# Patient Record
Sex: Female | Born: 1955 | Race: Black or African American | Hispanic: No | Marital: Married | Smoking: Never smoker
Health system: Southern US, Community
[De-identification: ages and names within clinical notes are randomized; demographics above are authoritative.]

## PROBLEM LIST (undated history)

## (undated) DIAGNOSIS — Z780 Asymptomatic menopausal state: Secondary | ICD-10-CM

## (undated) DIAGNOSIS — R87610 Atypical squamous cells of undetermined significance on cytologic smear of cervix (ASC-US): Secondary | ICD-10-CM

## (undated) DIAGNOSIS — Z972 Presence of dental prosthetic device (complete) (partial): Secondary | ICD-10-CM

## (undated) DIAGNOSIS — R0600 Dyspnea, unspecified: Secondary | ICD-10-CM

## (undated) DIAGNOSIS — E7800 Pure hypercholesterolemia, unspecified: Secondary | ICD-10-CM

## (undated) DIAGNOSIS — D259 Leiomyoma of uterus, unspecified: Secondary | ICD-10-CM

## (undated) DIAGNOSIS — K439 Ventral hernia without obstruction or gangrene: Secondary | ICD-10-CM

## (undated) DIAGNOSIS — I10 Essential (primary) hypertension: Secondary | ICD-10-CM

## (undated) DIAGNOSIS — E559 Vitamin D deficiency, unspecified: Secondary | ICD-10-CM

## (undated) DIAGNOSIS — I493 Ventricular premature depolarization: Secondary | ICD-10-CM

## (undated) DIAGNOSIS — E890 Postprocedural hypothyroidism: Secondary | ICD-10-CM

## (undated) HISTORY — DX: Asymptomatic menopausal state: Z78.0

## (undated) HISTORY — DX: Vitamin D deficiency, unspecified: E55.9

## (undated) HISTORY — DX: Atypical squamous cells of undetermined significance on cytologic smear of cervix (ASC-US): R87.610

## (undated) HISTORY — PX: COSMETIC SURGERY: SHX468

## (undated) HISTORY — DX: Pure hypercholesterolemia, unspecified: E78.00

## (undated) HISTORY — DX: Leiomyoma of uterus, unspecified: D25.9

## (undated) HISTORY — DX: Essential (primary) hypertension: I10

## (undated) HISTORY — DX: Postprocedural hypothyroidism: E89.0

## (undated) HISTORY — DX: Ventral hernia without obstruction or gangrene: K43.9

---
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
## 2004-08-19 HISTORY — PX: BREAST BIOPSY: SHX20

## 2005-06-14 ENCOUNTER — Ambulatory Visit: Payer: Self-pay | Admitting: Family Medicine

## 2006-01-07 ENCOUNTER — Ambulatory Visit: Payer: Self-pay | Admitting: General Surgery

## 2006-08-19 HISTORY — PX: TOTAL THYROIDECTOMY: SHX2547

## 2007-01-13 ENCOUNTER — Ambulatory Visit: Payer: Self-pay | Admitting: Family Medicine

## 2007-01-28 ENCOUNTER — Ambulatory Visit: Payer: Self-pay | Admitting: Family Medicine

## 2007-01-29 ENCOUNTER — Ambulatory Visit: Payer: Self-pay | Admitting: Gastroenterology

## 2007-04-03 ENCOUNTER — Other Ambulatory Visit: Payer: Self-pay

## 2007-04-03 ENCOUNTER — Ambulatory Visit: Payer: Self-pay | Admitting: Unknown Physician Specialty

## 2007-04-15 ENCOUNTER — Ambulatory Visit: Payer: Self-pay | Admitting: Otolaryngology

## 2008-02-04 ENCOUNTER — Ambulatory Visit: Payer: Self-pay | Admitting: Family Medicine

## 2008-09-27 ENCOUNTER — Ambulatory Visit: Payer: Self-pay | Admitting: Family Medicine

## 2010-04-11 ENCOUNTER — Emergency Department: Payer: Self-pay | Admitting: Internal Medicine

## 2010-12-25 ENCOUNTER — Ambulatory Visit: Payer: Self-pay | Admitting: Family Medicine

## 2012-05-27 ENCOUNTER — Encounter: Payer: Self-pay | Admitting: Family Medicine

## 2012-06-01 ENCOUNTER — Encounter: Payer: Self-pay | Admitting: *Deleted

## 2012-06-04 ENCOUNTER — Encounter: Payer: Self-pay | Admitting: Family Medicine

## 2012-06-24 ENCOUNTER — Encounter: Payer: Self-pay | Admitting: Family Medicine

## 2012-06-24 ENCOUNTER — Ambulatory Visit (INDEPENDENT_AMBULATORY_CARE_PROVIDER_SITE_OTHER): Payer: BC Managed Care – PPO | Admitting: Family Medicine

## 2012-06-24 VITALS — BP 140/90 | HR 66 | Temp 97.6°F | Resp 16 | Ht 65.5 in | Wt 194.4 lb

## 2012-06-24 DIAGNOSIS — Z01419 Encounter for gynecological examination (general) (routine) without abnormal findings: Secondary | ICD-10-CM | POA: Insufficient documentation

## 2012-06-24 DIAGNOSIS — B373 Candidiasis of vulva and vagina: Secondary | ICD-10-CM | POA: Insufficient documentation

## 2012-06-24 DIAGNOSIS — Z0000 Encounter for general adult medical examination without abnormal findings: Secondary | ICD-10-CM | POA: Insufficient documentation

## 2012-06-24 LAB — POCT URINALYSIS DIPSTICK
Glucose, UA: NEGATIVE
Ketones, UA: NEGATIVE
Leukocytes, UA: NEGATIVE
Protein, UA: NEGATIVE
Spec Grav, UA: 1.025
Urobilinogen, UA: 0.2

## 2012-06-24 LAB — POCT WET PREP WITH KOH: Clue Cells Wet Prep HPF POC: NEGATIVE

## 2012-06-24 MED ORDER — SIMVASTATIN 20 MG PO TABS: 20.0000 mg | ORAL_TABLET | Freq: Every evening | ORAL | Status: DC

## 2012-06-24 MED ORDER — KETOCONAZOLE 2 % EX CREA: TOPICAL_CREAM | Freq: Two times a day (BID) | CUTANEOUS | Status: DC

## 2012-06-24 NOTE — Progress Notes (Signed)
[address]   [place], [place]  [address]   [phone]  Subjective:    Patient ID: Stacey Cline, female    DOB: 12-22-1955, 56 y.o.   MRN: [id number]  HPIThis 56 y.o. female presents for evaluation for CPE.  Last physical 03/2011.  Pap smear 03/2011.  Mammogram 03/2011.  Colonoscopy UTD.  TDAP.  Flu vaccine never.  Eye exam 2011; +glasses; no glaucoma or cataracts.  Dental exam 2 years ago.    Review of Systems  Constitutional: Negative for fever, chills, diaphoresis, activity change, appetite change, fatigue and unexpected weight change.  HENT: Negative for hearing loss, ear pain, nosebleeds, congestion, sore throat, facial swelling, rhinorrhea, sneezing, drooling, mouth sores, trouble swallowing, neck pain, neck stiffness, dental problem, voice change, postnasal drip, sinus pressure, tinnitus and ear discharge.   Eyes: Negative for photophobia, pain, discharge, redness, itching and visual disturbance.  Respiratory: Negative for apnea, cough, choking, chest tightness, shortness of breath, wheezing and stridor.   Cardiovascular: Negative for chest pain, palpitations and leg swelling.  Gastrointestinal: Negative for nausea, vomiting, abdominal pain, diarrhea, constipation, blood in stool, abdominal distention, anal bleeding and rectal pain.  Genitourinary: Negative for dysuria, urgency, frequency, hematuria, flank pain, decreased urine volume, vaginal bleeding, vaginal discharge, enuresis, difficulty urinating, genital sores, vaginal pain, pelvic pain and dyspareunia.       +vaginal itching for one month.  Musculoskeletal: Negative for myalgias, back pain, joint swelling, arthralgias and gait problem.  Skin: Negative for color change, pallor, rash and wound.  Neurological: Negative for dizziness, tremors, seizures, syncope, facial asymmetry, speech difficulty, weakness, light-headedness, numbness and headaches.  Hematological: Negative for adenopathy. Does not bruise/bleed easily.    Psychiatric/Behavioral: Negative for suicidal ideas, hallucinations, behavioral problems, confusion, sleep disturbance, self-injury, dysphoric mood, decreased concentration and agitation. The patient is not nervous/anxious and is not hyperactive.     Past Medical History  Diagnosis Date  . Leiomyoma of uterus, unspecified   . Pain in joint, multiple sites   . Candidiasis of vulva and vagina   . Pure hypercholesterolemia   . Postsurgical hypothyroidism   . Unspecified vitamin D deficiency   . Papanicolaou smear of cervix with atypical squamous cells of undetermined significance (ASC-US)   . Pain in joint, shoulder region   . Ventral hernia, unspecified, without mention of obstruction or gangrene   . Menopause age 86    Past Surgical History  Procedure Date  . Tonsillectomy and adenoidectomy 1960's  . Breast biopsy 2006    Right  Benign  . Total thyroidectomy 2008    Prior to Admission medications   Medication Sig Start Date End Date Taking? Authorizing Provider  Calcium Carbonate-Vitamin D (CALCIUM 500 + D) 500-125 MG-UNIT TABS Take by mouth daily.   Yes Historical Provider, MD  cholecalciferol (VITAMIN D) 1000 UNITS tablet Take 1,000 Units by mouth daily.   Yes Historical Provider, MD  levothyroxine (SYNTHROID, LEVOTHROID) 50 MCG tablet Take 50 mcg by mouth daily.   Yes Historical Provider, MD  simvastatin (ZOCOR) 20 MG tablet Take 1 tablet (20 mg total) by mouth every evening. 06/24/12  Yes Ethelda Chick, MD  ketoconazole (NIZORAL) 2 % cream Apply topically 2 (two) times daily. 06/24/12   Ethelda Chick, MD  naproxen sodium (ANAPROX) 220 MG tablet Take 220 mg by mouth daily.    Historical Provider, MD    No Known Allergies  History   Social History  . Marital Status: Married    Spouse Name: N/A  Number of Children: 0  . Years of Education: college   Occupational History  . [employer]   Social History Main Topics  . Smoking status: Never Smoker   .  Smokeless tobacco: Not on file  . Alcohol Use: No  . Drug Use: No  . Sexually Active: Not on file   Other Topics Concern  . Not on file   Social History Narrative   Married x 14 years; Happily, no abuse. Husband is a Education officer, environmental.    Children: none   Lives: with husband.   Employment:  Retire 06/2012 from [employer] after 35 years; wants to volunteer.   Tobacco:     Alcohol:   Caffeine use: Moderate amount. Exercise: Inactive.    Family History  Problem Relation Age of Onset  . Breast cancer    . Hypertension    . Diabetes type II    . Heart disease Father     CAD/3  AMI in 4's   . Stroke Father     several  . Lung disease Father   . Hypertension Mother   . Diabetes Mother   . Heart disease Mother   . Diabetes Sister   . Cancer Sister 39    Breast cancer  . Hypertension Brother         Objective:   Physical Exam  Nursing note and vitals reviewed. Constitutional: She is oriented to person, place, and time. She appears well-developed and well-nourished. No distress.  HENT:  Head: Normocephalic and atraumatic.  Right Ear: External ear normal.  Left Ear: External ear normal.  Nose: Nose normal.  Mouth/Throat: Oropharynx is clear and moist.  Eyes: Conjunctivae normal and EOM are normal. Pupils are equal, round, and reactive to light.  Neck: Normal range of motion. Neck supple. No tracheal deviation present. No thyromegaly present.  Cardiovascular: Normal rate, regular rhythm, normal heart sounds and intact distal pulses.  Exam reveals no gallop and no friction rub.   No murmur heard. Pulmonary/Chest: Effort normal. She has no wheezes. She has no rales.  Abdominal: Soft. Bowel sounds are normal. She exhibits no distension and no mass. There is no tenderness. There is no rebound and no guarding. Hernia confirmed negative in the right inguinal area and confirmed negative in the left inguinal area.  Genitourinary: Vagina normal and uterus normal. No breast swelling, tenderness, discharge  or bleeding. There is no rash, tenderness, lesion or injury on the right labia. There is no rash, tenderness, lesion or injury on the left labia. No erythema, tenderness or bleeding around the vagina. No vaginal discharge found.  Musculoskeletal: Normal range of motion. She exhibits no edema and no tenderness.  Lymphadenopathy:    She has no cervical adenopathy.       Right: No inguinal adenopathy present.       Left: No inguinal adenopathy present.  Neurological: She is alert and oriented to person, place, and time. She has normal reflexes. No cranial nerve deficit. She exhibits normal muscle tone. Coordination normal.  Skin: Skin is warm and dry. No rash noted. She is not diaphoretic. No erythema. No pallor.  Psychiatric: She has a normal mood and affect. Her behavior is normal. Judgment and thought content normal.    EKG: NSR.      Assessment & Plan:  Annual physical exam - Plan: CBC with Differential, CK, Comprehensive metabolic panel, Hemoglobin A1c, TSH, T4, Free, Vitamin B12, Folate, Iron and TIBC [[facility]], Vitamin D 25 hydroxy [[facility]], POCT urinalysis dipstick, EKG  12-Lead, Lipid panel, POCT Wet Prep with KOH  Routine gynecological examination - Plan: MM Digital Screening, Pap IG and HPV (high risk) DNA detection, POCT Wet Prep with KOH  Candidiasis of vulva     1.  CPE: anticipatory guidance --- weight loss, exercise.  Pap smear obtained; refer for mammogram.  Colonoscopy UTD.  Immunizations UTD.  Obtain labs. 2.  Gyn exam: completed; pap smear obtained; refer for mammogram. 3.  Candidiasis of vulva:  New. Rx for Ketoconazole cream bid for two weeks provided; to call in two weeks if no improvement.    Meds ordered this encounter  Medications  . simvastatin (ZOCOR) 20 MG tablet    Sig: Take 1 tablet (20 mg total) by mouth every evening.    Dispense:  30 tablet    Refill:  5  . ketoconazole (NIZORAL) 2 % cream    Sig: Apply topically 2 (two) times daily.    Dispense:   30 g    Refill:  0

## 2012-06-24 NOTE — Patient Instructions (Addendum)
1. Annual physical exam  CBC with Differential, CK, Comprehensive metabolic panel, Hemoglobin A1c, TSH, T4, Free, Vitamin B12, Folate, Iron and TIBC [[facility]], Vitamin D 25 hydroxy [[facility]], POCT urinalysis dipstick, EKG 12-Lead, Lipid panel  2. Routine gynecological examination  MM Digital Screening

## 2012-06-24 NOTE — Assessment & Plan Note (Signed)
Anticipatory guidance --- weight loss, exercise.  Pap smear obtained; refer for mammogram. Colonoscopy UTD.  Immunizations --- pt refused influenza vaccine.  Obtain labs.

## 2012-06-24 NOTE — Assessment & Plan Note (Signed)
Performed; pap smear obtained; refer for mammogram.

## 2012-06-24 NOTE — Assessment & Plan Note (Signed)
New.  Treat with Ketoconazole cream 2% bid x 2 weeks.

## 2012-06-25 ENCOUNTER — Other Ambulatory Visit: Payer: Self-pay | Admitting: Radiology

## 2012-06-26 LAB — LIPID PANEL
Cholesterol: 257 mg/dL — ABNORMAL HIGH (ref 0–200)
Triglycerides: 138 mg/dL (ref <150)
VLDL: 28 mg/dL (ref 0–40)

## 2012-06-26 LAB — CBC WITH DIFFERENTIAL/PLATELET
Basophils Absolute: 0 10*3/uL (ref 0.0–0.1)
Eosinophils Absolute: 0.1 10*3/uL (ref 0.0–0.7)
Eosinophils Relative: 1 % (ref 0–5)
Lymphocytes Relative: 36 % (ref 12–46)
MCV: 85.7 fL (ref 78.0–100.0)
Platelets: 286 10*3/uL (ref 150–400)
RDW: 15.2 % (ref 11.5–15.5)
WBC: 6.1 10*3/uL (ref 4.0–10.5)

## 2012-06-26 LAB — IRON AND TIBC: %SAT: 16 % — ABNORMAL LOW (ref 20–55)

## 2012-06-26 LAB — TSH: TSH: 0.194 u[IU]/mL — ABNORMAL LOW (ref 0.350–4.500)

## 2012-06-26 LAB — COMPREHENSIVE METABOLIC PANEL
AST: 16 U/L (ref 0–37)
Alkaline Phosphatase: 65 U/L (ref 39–117)
BUN: 16 mg/dL (ref 6–23)
Creat: 0.89 mg/dL (ref 0.50–1.10)
Potassium: 4.2 mEq/L (ref 3.5–5.3)
Total Bilirubin: 0.3 mg/dL (ref 0.3–1.2)

## 2012-06-26 LAB — VITAMIN B12: Vitamin B-12: 301 pg/mL (ref 211–911)

## 2012-06-26 LAB — PAP IG AND HPV HIGH-RISK

## 2012-07-02 ENCOUNTER — Other Ambulatory Visit: Payer: Self-pay

## 2012-07-02 LAB — FOLATE: Folate: 13.4 ng/mL (ref >5.4)

## 2012-07-02 MED ORDER — FLUCONAZOLE 150 MG PO TABS: 150.0000 mg | ORAL_TABLET | Freq: Once | ORAL | Status: DC

## 2012-07-09 ENCOUNTER — Telehealth: Payer: Self-pay

## 2012-07-09 NOTE — Telephone Encounter (Signed)
RTC

## 2012-07-09 NOTE — Telephone Encounter (Signed)
Pt called stated still having problems with yeast infection. Pt would like something  Karolee Ohs called into pharmacy- [facility]. Pt may be reached at [phone]

## 2012-07-10 NOTE — Telephone Encounter (Signed)
I would think it would be best for her to RTC for [place] to make sure she still has the infection and it has changed changed to BV.  If pt is not happy with this please send to Dr. Katrinka Blazing for review.

## 2012-07-11 NOTE — Telephone Encounter (Signed)
I have spoken to patient she is scheduled to go out of town on Monday. I have advised her this needs to be looked at microscopically. She states she is irritated on her vaginal area. I spoke to Marylene Land and advised patient to continue with her current treatment, irritation may be from the cream she was using. She states if she gets worse she will call me and return to clinic.

## 2012-07-21 ENCOUNTER — Telehealth: Payer: Self-pay | Admitting: Radiology

## 2012-07-21 NOTE — Telephone Encounter (Signed)
I agree with previous message, she likely will need to RTC, but will forward to Dr. Katrinka Blazing for review.

## 2012-07-21 NOTE — Telephone Encounter (Signed)
Patient states she is one hour away and does not want to come in, she is still having vaginal irritation. I advised her 2 weeks ago to come back in for this. She has been advised to get proper treatment she will need to have wet prep. She wants another message sent  [phone]

## 2012-07-22 NOTE — Telephone Encounter (Signed)
OK to call in the following:  1.  Diflucan 100mg  two po q d x 1 day, then one po q d x 6 days #8 no refills.   2. Terazol-7 vaginal suppositories  Insert one vaginal suppository qhs x 7 days Disp: 42 grams with applicators  No refills.  KMS

## 2012-07-23 MED ORDER — FLUCONAZOLE 100 MG PO TABS: ORAL_TABLET | ORAL | Status: DC

## 2012-07-23 MED ORDER — TERCONAZOLE 80 MG VA SUPP: 80.0000 mg | Freq: Every day | VAGINAL | Status: DC

## 2012-07-23 NOTE — Telephone Encounter (Signed)
Spoke w/pt and explained both Rxs being sent in. Pt agreed and will RTC if Sxs persist after tx. Sent in Rxs to pharmacy.

## 2012-08-05 ENCOUNTER — Ambulatory Visit (INDEPENDENT_AMBULATORY_CARE_PROVIDER_SITE_OTHER): Payer: BC Managed Care – PPO | Admitting: Family Medicine

## 2012-08-05 VITALS — BP 133/83 | HR 75 | Temp 98.0°F | Resp 16 | Ht 67.0 in | Wt 195.6 lb

## 2012-08-05 DIAGNOSIS — N898 Other specified noninflammatory disorders of vagina: Secondary | ICD-10-CM

## 2012-08-05 DIAGNOSIS — N899 Noninflammatory disorder of vagina, unspecified: Secondary | ICD-10-CM

## 2012-08-05 LAB — POCT WET PREP WITH KOH
Clue Cells Wet Prep HPF POC: NEGATIVE
Yeast Wet Prep HPF POC: NEGATIVE

## 2012-08-05 MED ORDER — CLARITHROMYCIN 500 MG PO TABS: ORAL_TABLET | ORAL | Status: DC

## 2012-08-05 MED ORDER — TRIAMCINOLONE ACETONIDE 0.1 % EX CREA: TOPICAL_CREAM | Freq: Two times a day (BID) | CUTANEOUS | Status: DC

## 2012-08-05 NOTE — Patient Instructions (Addendum)
1. Vaginal irritation  POCT Wet Prep with KOH

## 2012-08-05 NOTE — Progress Notes (Signed)
[address]   [place], [place]  [address]   [phone]  Subjective:    Patient ID: Stacey Cline, female    DOB: 23-Sep-1955, 56 y.o.   MRN: [id number]  HPIThis 56 y.o. female presents for evaluation of vaginal irritation.  S/p evaluation at CPE 06/22/2012; pap smear +candida.  Rx for Ketoconazole bid without improvement.  Called 07/22/12; rx for Diflucan for one week; rx for Terazol qhs for seven days; slight improvement.  Vagisil and vaseline helps more than anything.  No vaginal discharge; no internal itching.  Wet prep negative.  No sexual activity for two weeks.  Only drank water; recently went on cruise.  Never worsened with heat, humidity.  Has used entire tube of Vagisil.  No change in soaps, detergents, lotions, shampoos.  Not really itching but crawling sensation in inguinal folds and area that urinates.  No lice, bugs.   Review of Systems  Constitutional: Negative for fever, chills, diaphoresis and fatigue.  Genitourinary: Negative for dysuria, frequency, hematuria, vaginal bleeding, vaginal discharge, genital sores and vaginal pain.       +vaginal irritation; no itching.        Past Medical History  Diagnosis Date  . Leiomyoma of uterus, unspecified   . Pain in joint, multiple sites   . Candidiasis of vulva and vagina   . Pure hypercholesterolemia   . Postsurgical hypothyroidism   . Unspecified vitamin D deficiency   . Papanicolaou smear of cervix with atypical squamous cells of undetermined significance (ASC-US)   . Pain in joint, shoulder region   . Ventral hernia, unspecified, without mention of obstruction or gangrene   . Menopause age 42    Past Surgical History  Procedure Date  . Tonsillectomy and adenoidectomy 1960's  . Breast biopsy 2006    Right  Benign  . Total thyroidectomy 2008    Prior to Admission medications   Medication Sig Start Date End Date Taking? Authorizing Provider  Calcium Carbonate-Vitamin D (CALCIUM 500 + D) 500-125 MG-UNIT TABS  Take by mouth daily.   Yes Historical Provider, MD  cholecalciferol (VITAMIN D) 1000 UNITS tablet Take 1,000 Units by mouth daily.   Yes Historical Provider, MD  levothyroxine (SYNTHROID, LEVOTHROID) 50 MCG tablet Take 50 mcg by mouth daily.   Yes Historical Provider, MD  simvastatin (ZOCOR) 20 MG tablet Take 1 tablet (20 mg total) by mouth every evening. 06/24/12  Yes Ethelda Chick, MD  clarithromycin Quita Skye) 500 MG tablet 2 tablet po x 1 08/05/12   Ethelda Chick, MD  fluconazole (DIFLUCAN) 100 MG tablet Take two tablets by mouth for one day, then one tablet daily for six days. 07/23/12   Ethelda Chick, MD  ketoconazole (NIZORAL) 2 % cream Apply topically 2 (two) times daily. 06/24/12   Ethelda Chick, MD  naproxen sodium (ANAPROX) 220 MG tablet Take 220 mg by mouth daily.    Historical Provider, MD  terconazole (TERAZOL 3) 80 MG vaginal suppository Place 1 suppository (80 mg total) vaginally at bedtime. 07/23/12   Ethelda Chick, MD  triamcinolone cream (KENALOG) 0.1 % Apply topically 2 (two) times daily. 08/05/12   Ethelda Chick, MD    No Known Allergies  History   Social History  . Marital Status: Married    Spouse Name: N/A    Number of Children: 0  . Years of Education: college   Occupational History  . [employer]   Social History Main Topics  . Smoking status:  Never Smoker   . Smokeless tobacco: Not on file  . Alcohol Use: No  . Drug Use: No  . Sexually Active: Not on file   Other Topics Concern  . Not on file   Social History Narrative   Married x 14 years; Happily, no abuse. Husband is a Education officer, environmental.    Children: none   Lives: with husband.   Employment:  Retire 06/2012 from [employer] after 35 years; wants to volunteer.   Tobacco:     Alcohol:   Caffeine use: Moderate amount. Exercise: Inactive.    Family History  Problem Relation Age of Onset  . Breast cancer    . Hypertension    . Diabetes type II    . Heart disease Father     CAD/3  AMI in 96's   . Stroke  Father     several  . Lung disease Father   . Hypertension Mother   . Diabetes Mother   . Heart disease Mother   . Diabetes Sister   . Cancer Sister 42    Breast cancer  . Hypertension Brother     Objective:   Physical Exam  Nursing note and vitals reviewed. Constitutional: She appears well-developed and well-nourished.  Abdominal: Hernia confirmed negative in the right inguinal area and confirmed negative in the left inguinal area.  Genitourinary: Vagina normal. There is no rash, tenderness or lesion on the right labia. There is no rash, tenderness or lesion on the left labia. No vaginal discharge found.       No irritation external anatomy; no mites/lice.  Normal hair distribution.  Lymphadenopathy:       Right: No inguinal adenopathy present.       Left: No inguinal adenopathy present.  Skin: No rash noted. No erythema. No pallor.       Results for orders placed in visit on 08/05/12  POCT WET PREP WITH KOH      Component Value Range   Trichomonas, UA Negative     Clue Cells Wet Prep HPF POC neg     Epithelial Wet Prep HPF POC 5-8     Yeast Wet Prep HPF POC neg     Bacteria Wet Prep HPF POC 1+     RBC Wet Prep HPF POC 0-2     WBC Wet Prep HPF POC 10-15     KOH Prep POC Negative      Assessment & Plan:   1. Vaginal irritation  POCT Wet Prep with KOH     1.  Vaginal irritation:  Persistent; duration six weeks.  Treat empirically for Erythrasma with Biaxin 500mg  two po x 1; rx for Triamcinolone bid for two weeks to treat for contact dermatitis.  No improvement with topical Ketoconazole, Diflucan, Terazol.  To call in two weeks if no improvement.  Meds ordered this encounter  Medications  . clarithromycin (BIAXIN) 500 MG tablet    Sig: 2 tablet po x 1    Dispense:  2 tablet    Refill:  0  . triamcinolone cream (KENALOG) 0.1 %    Sig: Apply topically 2 (two) times daily.    Dispense:  60 g    Refill:  0

## 2012-10-03 NOTE — Progress Notes (Signed)
Reviewed and agree.

## 2012-12-23 ENCOUNTER — Ambulatory Visit: Payer: BC Managed Care – PPO | Admitting: Family Medicine

## 2013-01-14 ENCOUNTER — Other Ambulatory Visit: Payer: Self-pay | Admitting: Family Medicine

## 2013-03-22 ENCOUNTER — Other Ambulatory Visit: Payer: Self-pay | Admitting: Family Medicine

## 2013-03-23 ENCOUNTER — Other Ambulatory Visit: Payer: Self-pay | Admitting: Family Medicine

## 2013-04-28 ENCOUNTER — Ambulatory Visit (INDEPENDENT_AMBULATORY_CARE_PROVIDER_SITE_OTHER): Payer: BC Managed Care – PPO | Admitting: Family Medicine

## 2013-04-28 ENCOUNTER — Ambulatory Visit: Payer: BC Managed Care – PPO

## 2013-04-28 ENCOUNTER — Other Ambulatory Visit: Payer: Self-pay | Admitting: Physician Assistant

## 2013-04-28 ENCOUNTER — Encounter: Payer: Self-pay | Admitting: Family Medicine

## 2013-04-28 VITALS — BP 134/78 | HR 72 | Temp 98.3°F | Resp 16 | Ht 65.75 in | Wt 194.2 lb

## 2013-04-28 DIAGNOSIS — R0609 Other forms of dyspnea: Secondary | ICD-10-CM

## 2013-04-28 DIAGNOSIS — R1013 Epigastric pain: Secondary | ICD-10-CM

## 2013-04-28 DIAGNOSIS — R002 Palpitations: Secondary | ICD-10-CM

## 2013-04-28 DIAGNOSIS — Z1239 Encounter for other screening for malignant neoplasm of breast: Secondary | ICD-10-CM

## 2013-04-28 DIAGNOSIS — R0600 Dyspnea, unspecified: Secondary | ICD-10-CM

## 2013-04-28 DIAGNOSIS — IMO0002 Reserved for concepts with insufficient information to code with codable children: Secondary | ICD-10-CM

## 2013-04-28 DIAGNOSIS — E7800 Pure hypercholesterolemia, unspecified: Secondary | ICD-10-CM

## 2013-04-28 DIAGNOSIS — R7302 Impaired glucose tolerance (oral): Secondary | ICD-10-CM

## 2013-04-28 DIAGNOSIS — R7309 Other abnormal glucose: Secondary | ICD-10-CM

## 2013-04-28 DIAGNOSIS — E039 Hypothyroidism, unspecified: Secondary | ICD-10-CM

## 2013-04-28 LAB — POCT URINALYSIS DIPSTICK
Blood, UA: NEGATIVE
Nitrite, UA: NEGATIVE
Protein, UA: NEGATIVE
Urobilinogen, UA: 0.2
pH, UA: 5.5

## 2013-04-28 LAB — CBC
HCT: 40.2 % (ref 36.0–46.0)
MCV: 81 fL (ref 78.0–100.0)
Platelets: 264 10*3/uL (ref 150–400)
RBC: 4.96 MIL/uL (ref 3.87–5.11)
WBC: 5.9 10*3/uL (ref 4.0–10.5)

## 2013-04-28 LAB — POCT WET PREP WITH KOH
KOH Prep POC: NEGATIVE
Yeast Wet Prep HPF POC: NEGATIVE

## 2013-04-28 LAB — LIPID PANEL
Cholesterol: 196 mg/dL (ref 0–200)
HDL: 46 mg/dL (ref >39)
Total CHOL/HDL Ratio: 4.3 Ratio
VLDL: 32 mg/dL (ref 0–40)

## 2013-04-28 LAB — COMPREHENSIVE METABOLIC PANEL
ALT: 24 U/L (ref 0–35)
Albumin: 4.3 g/dL (ref 3.5–5.2)
CO2: 29 mEq/L (ref 19–32)
Chloride: 104 mEq/L (ref 96–112)
Creat: 0.85 mg/dL (ref 0.50–1.10)
Total Protein: 7 g/dL (ref 6.0–8.3)

## 2013-04-28 LAB — POCT UA - MICROSCOPIC ONLY
Casts, Ur, LPF, POC: NEGATIVE
Crystals, Ur, HPF, POC: NEGATIVE
Yeast, UA: NEGATIVE

## 2013-04-28 LAB — HEMOGLOBIN A1C: Mean Plasma Glucose: 134 mg/dL — ABNORMAL HIGH (ref <117)

## 2013-04-28 MED ORDER — OMEPRAZOLE 20 MG PO CPDR: 20.0000 mg | DELAYED_RELEASE_CAPSULE | Freq: Every day | ORAL | Status: DC

## 2013-04-28 NOTE — Progress Notes (Signed)
[address]   [place], [place]  [address]   [phone]  Subjective:    Patient ID: Stacey Cline, female    DOB: 20-Jan-1956, 57 y.o.   MRN: [id number]  HPI This 57 y.o. female presents for evaluation of palpitations, DOE.  Heart disease runs in family.  Upper abdominal swelling/hernia; something is pressing in epigastric region.  Two weeks ago, having severe SOB, "couldn't breath".  Scheduled to go out of the country so did not seek medical care.  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  With eating, everything stops in stomach; +epigastric pain; has decreased eating due to epigastric pain.  No chest pain.  No upper chest pain.  With aggravation/irritability, cannot tolerate and become SOB.  No similar symptoms in the past.  +heart racing/palpitations.  Has backed off caffeine to decrease palpitations.  Did drink caffeine yesterday, and heart started pounding harder.  No exercise.  Exerting does not make worse.  Some foods make epigastric area burn.  "I have no breath"; gradually worsening for the past five years.  + DOE.  Two days after wedding, started feeling poorly.  Felt well while traveling internationally.  Took it easy; went to [place].  Appetite is normal; has decreased eating due to pain with eating in epigastric region.  2.  Dyspareunia: having pain with intercourse for the past two times with deep penetration; no vaginal dryness; no vaginal bleeding.  Thinks fibroids are causing pain.  Menopausal.    3. Hyperlipidemia: nine month follow-up; no changes to management made at last visit; fasting today.  4.  Hypothyroidism: compliance with medication.   Review of Systems  Constitutional: Negative for fever, chills, diaphoresis, appetite change and fatigue.  Respiratory: Positive for shortness of breath. Negative for cough, choking, chest tightness, wheezing and stridor.   Cardiovascular: Positive for palpitations. Negative for chest pain and  leg swelling.  Gastrointestinal: Positive for abdominal pain and abdominal distention. Negative for nausea, vomiting, diarrhea, constipation, blood in stool, anal bleeding and rectal pain.  Genitourinary: Positive for vaginal pain, pelvic pain and dyspareunia. Negative for dysuria, urgency, flank pain, vaginal bleeding, vaginal discharge and difficulty urinating.  Skin: Negative for rash.  Neurological: Negative for dizziness, seizures, syncope, speech difficulty, weakness, light-headedness and headaches.    Past Medical History  Diagnosis Date  . Leiomyoma of uterus, unspecified   . Pain in joint, multiple sites   . Candidiasis of vulva and vagina   . Pure hypercholesterolemia   . Postsurgical hypothyroidism   . Unspecified vitamin D deficiency   . Papanicolaou smear of cervix with atypical squamous cells of undetermined significance (ASC-US)   . Pain in joint, shoulder region   . Ventral hernia, unspecified, without mention of obstruction or gangrene   . Menopause age 70    Past Surgical History  Procedure Laterality Date  . Tonsillectomy and adenoidectomy  1960's  . Breast biopsy  2006    Right  Benign  . Total thyroidectomy  2008    Prior to Admission medications   Medication Sig Start Date End Date Taking? Authorizing Provider  Calcium Carbonate-Vitamin D (CALCIUM 500 + D) 500-125 MG-UNIT TABS Take by mouth daily.   Yes Historical Provider, MD  cholecalciferol (VITAMIN D) 1000 UNITS tablet Take 1,000 Units by mouth daily.   Yes Historical Provider, MD  levothyroxine (SYNTHROID, LEVOTHROID) 50 MCG tablet Take 50 mcg by mouth daily.   Yes Historical Provider, MD  simvastatin (ZOCOR) 20 MG  tablet TAKE 1 TABLET BY MOUTH IN THE EVENING 03/23/13  Yes Morrell Riddle, PA-C    Allergies  Allergen Reactions  . Penicillins Rash    History   Social History  . Marital Status: Married    Spouse Name: N/A    Number of Children: 0  . Years of Education: college   Occupational  History  . [employer]   Social History Main Topics  . Smoking status: Never Smoker   . Smokeless tobacco: Not on file  . Alcohol Use: No  . Drug Use: No  . Sexual Activity: Not on file   Other Topics Concern  . Not on file   Social History Narrative   Married x 14 years; Happily, no abuse. Husband is a Education officer, environmental.       Children: none      Lives: with husband.      Employment:  Retire 06/2012 from [employer] after 35 years; wants to volunteer.      Tobacco:        Alcohol:   Caffeine use: Moderate amount. Exercise: Inactive.    Family History  Problem Relation Age of Onset  . Breast cancer    . Hypertension    . Diabetes type II    . Heart disease Father     CAD/3  AMI in 88's   . Stroke Father     several  . Lung disease Father   . Hypertension Mother   . Diabetes Mother   . Heart disease Mother   . Diabetes Sister   . Cancer Sister 52    Breast cancer  . Hypertension Brother        Objective:   Physical Exam  Nursing note and vitals reviewed. Constitutional: She is oriented to person, place, and time. She appears well-developed and well-nourished. No distress.  HENT:  Head: Normocephalic and atraumatic.  Mouth/Throat: Oropharynx is clear and moist.  Eyes: Conjunctivae are normal. Pupils are equal, round, and reactive to light.  Neck: Normal range of motion. Neck supple. No thyromegaly present.  Cardiovascular: Normal rate, regular rhythm, normal heart sounds and intact distal pulses.  Exam reveals no gallop and no friction rub.   No murmur heard. Pulmonary/Chest: Effort normal and breath sounds normal. No respiratory distress. She has no wheezes. She has no rales.  Abdominal: Soft. Bowel sounds are normal. She exhibits no distension and no mass. There is tenderness in the epigastric area. There is no rebound and no guarding.  Genitourinary: Vagina normal. There is no rash, tenderness or lesion on the right labia. There is no rash, tenderness or lesion on the  left labia. Uterus is enlarged and tender. Cervix exhibits no motion tenderness, no discharge and no friability. Right adnexum displays no mass, no tenderness and no fullness. Left adnexum displays no mass, no tenderness and no fullness.  Lymphadenopathy:    She has no cervical adenopathy.  Neurological: She is alert and oriented to person, place, and time.  Skin: Skin is warm and dry. No rash noted. She is not diaphoretic.  Psychiatric: She has a normal mood and affect. Her behavior is normal. Judgment and thought content normal.   [facility] reading (PRIMARY) by  Dr. Katrinka Blazing. CXR: NAD.  EKG: NSR: no ST changes.    Assessment & Plan:  Abdominal pain, epigastric - Plan: CBC, Comprehensive metabolic panel, POCT urinalysis dipstick, POCT UA - Microscopic Only  Palpitations - Plan: EKG 12-Lead, DG Chest 2 View  DOE (dyspnea on exertion) -  Plan: DG Chest 2 View  Dyspareunia - Plan: POCT Wet Prep with KOH  Pure hypercholesterolemia - Plan: Lipid panel  Unspecified hypothyroidism - Plan: TSH  Glucose intolerance (impaired glucose tolerance) - Plan: Hemoglobin A1c   1. Palpitations:  New.  Associated with worsening DOE, stress/irritability, epigastric pain.  EKG with NSR.  Obtain labs.  If persists, refer to cardiology for Holter monitor and 2D-echo. 2.  DOE: worsening; onset five years ago with recent worsening; s/p CXR WNL.  Stable EKG. Normal pulse oximetry.  Associated with palpitations, epigastric pain.  Will treat epigastric pain, GERD; if persists, refer to cardiology for stress testing to rule out anginal equivalent.  Close follow-up. 3.  Abdominal pain epigastric;  New.  Eating makes pain worse; obtain labs; refer for abdominal u/s.  Rx for Prilosec provided to use daily. 4.  Dyspareunia:  New. No vaginal bleeding; no vaginal dryness; refer for pelvic ultrasound; known uterine fibroids. 5.  Hypercholesterolemia: stable; obtain labs; continue current medication. 6.  Hypothyroidism: stable;  with current symptoms, obtain TSH. 7.  Glucose Intolerance: stable; obtain lab; continue with dietary modification.  Meds ordered this encounter  Medications  . omeprazole (PRILOSEC) 20 MG capsule    Sig: Take 1 capsule (20 mg total) by mouth daily.    Dispense:  30 capsule    Refill:  3

## 2013-04-28 NOTE — Telephone Encounter (Signed)
Dr Katrinka Blazing, do you want to RF this med or wait for lipid lab results?

## 2013-04-29 ENCOUNTER — Other Ambulatory Visit: Payer: Self-pay | Admitting: Family Medicine

## 2013-04-29 ENCOUNTER — Other Ambulatory Visit: Payer: Self-pay | Admitting: Radiology

## 2013-04-29 DIAGNOSIS — R102 Pelvic and perineal pain: Secondary | ICD-10-CM

## 2013-05-06 ENCOUNTER — Ambulatory Visit
Admission: RE | Admit: 2013-05-06 | Discharge: 2013-05-06 | Disposition: A | Discharge status: Home / Self Care | Payer: BC Managed Care – PPO | Source: Ambulatory Visit | Attending: Family Medicine | Admitting: Family Medicine

## 2013-05-06 DIAGNOSIS — R102 Pelvic and perineal pain: Secondary | ICD-10-CM

## 2013-05-06 DIAGNOSIS — IMO0002 Reserved for concepts with insufficient information to code with codable children: Secondary | ICD-10-CM

## 2013-05-06 DIAGNOSIS — R1013 Epigastric pain: Secondary | ICD-10-CM

## 2013-05-11 ENCOUNTER — Ambulatory Visit: Payer: Self-pay | Admitting: Family Medicine

## 2013-05-17 ENCOUNTER — Telehealth: Payer: Self-pay

## 2013-05-17 ENCOUNTER — Other Ambulatory Visit: Payer: Self-pay | Admitting: Radiology

## 2013-05-17 DIAGNOSIS — R928 Other abnormal and inconclusive findings on diagnostic imaging of breast: Secondary | ICD-10-CM

## 2013-05-17 NOTE — Telephone Encounter (Signed)
Please advise 

## 2013-05-17 NOTE — Telephone Encounter (Signed)
Called patient. Advised additional orders are being sent to [facility] and she will hear back from this soon.

## 2013-05-17 NOTE — Telephone Encounter (Signed)
Pt received a call back (not from [place]) that something was found in her mammogram ordered by Dr. Katrinka Blazing. She has an appt with Dr. Katrinka Blazing 10/6 but wants to know if she should be following up with her here or if Dr. Katrinka Blazing is going to refer her elsewhere.  [phone]

## 2013-05-18 ENCOUNTER — Ambulatory Visit: Payer: BC Managed Care – PPO | Admitting: Family Medicine

## 2013-05-24 ENCOUNTER — Encounter: Payer: Self-pay | Admitting: Family Medicine

## 2013-05-24 ENCOUNTER — Ambulatory Visit (INDEPENDENT_AMBULATORY_CARE_PROVIDER_SITE_OTHER): Payer: BC Managed Care – PPO | Admitting: Family Medicine

## 2013-05-24 VITALS — BP 142/86 | HR 76 | Temp 98.0°F | Resp 16 | Ht 65.5 in | Wt 194.0 lb

## 2013-05-24 DIAGNOSIS — R0609 Other forms of dyspnea: Secondary | ICD-10-CM

## 2013-05-24 DIAGNOSIS — R1013 Epigastric pain: Secondary | ICD-10-CM

## 2013-05-24 DIAGNOSIS — R102 Pelvic and perineal pain: Secondary | ICD-10-CM

## 2013-05-24 DIAGNOSIS — R0600 Dyspnea, unspecified: Secondary | ICD-10-CM

## 2013-05-24 DIAGNOSIS — R928 Other abnormal and inconclusive findings on diagnostic imaging of breast: Secondary | ICD-10-CM

## 2013-05-24 NOTE — Progress Notes (Signed)
Subjective:    Patient ID: Stacey Cline, female    DOB: 10/31/1955, 57 y.o.   MRN: [id number]  HPI Has not had additional views of right breast from abnormal mammogram. Has not been called for appt. Right breast has been tender since biopsy. Left breast never tender.  Breathing has gotten worse. Unable to have enough breath to sing in the choir. Can't walk up two flights of stairs before having to hold on. Has had SOB for 5 years, much worse recently. No swelling. No SOB with lying flat at night. Is ready to have stress test. No asthma as child.   Eating only 1/2 portion until she gets full. Not having abdominal pain now. Just SOB when walking. No CP, no nausea, no vomiting, no diarrhea, no constipation, no bloating. Took omeprazole for one week. Stopped because stomach was better. No weight loss.  Started exercising last week, doing leg exercises and walking through house.  Abd [place]- +fatty liver. Pelvic US- worsening pain with intercourse and also pain at other times (transvaginal ultrasound uncomfortable). Will see Dr. [facility]). Has had fibroids for 15 years.   Review of Systems  Constitutional: Positive for activity change and fatigue. Negative for fever, chills, diaphoresis and appetite change.  Respiratory: Positive for shortness of breath. Negative for cough, wheezing and stridor.   Cardiovascular: Negative for chest pain, palpitations and leg swelling.  Gastrointestinal: Negative for nausea, vomiting, abdominal pain, diarrhea, constipation and abdominal distention.  Genitourinary: Negative for dysuria, urgency, frequency and hematuria.  Neurological: Negative for dizziness, facial asymmetry, light-headedness and headaches.  Psychiatric/Behavioral: Negative for dysphoric mood. The patient is not nervous/anxious.    Past Medical History  Diagnosis Date  . Leiomyoma of uterus, unspecified   . Pain in joint, multiple sites   . Candidiasis of vulva and vagina   . Pure  hypercholesterolemia   . Postsurgical hypothyroidism   . Unspecified vitamin D deficiency   . Papanicolaou smear of cervix with atypical squamous cells of undetermined significance (ASC-US)   . Pain in joint, shoulder region   . Ventral hernia, unspecified, without mention of obstruction or gangrene   . Menopause age 89   Past Surgical History  Procedure Laterality Date  . Tonsillectomy and adenoidectomy  1960's  . Breast biopsy  2006    Right  Benign  . Total thyroidectomy  2008   Allergies  Allergen Reactions  . Fish Allergy   . Penicillins Rash   Current Outpatient Prescriptions on File Prior to Visit  Medication Sig Dispense Refill  . Calcium Carbonate-Vitamin D (CALCIUM 500 + D) 500-125 MG-UNIT TABS Take by mouth daily.      Marland Kitchen levothyroxine (SYNTHROID, LEVOTHROID) 50 MCG tablet Take 50 mcg by mouth daily.      . simvastatin (ZOCOR) 20 MG tablet TAKE 1 TABLET BY MOUTH EVERY EVENING  30 tablet  5   No current facility-administered medications on file prior to visit.   History   Social History  . Marital Status: Married    Spouse Name: N/A    Number of Children: 0  . Years of Education: college   Occupational History  . [employer]   Social History Main Topics  . Smoking status: Never Smoker   . Smokeless tobacco: Not on file  . Alcohol Use: No  . Drug Use: No  . Sexual Activity: Not on file   Other Topics Concern  . Not on file   Social History Narrative   Married x 14  years; Happily, no abuse. Husband is a Education officer, environmental.       Children: none      Lives: with husband.      Employment:  Retire 06/2012 from [employer] after 35 years; wants to volunteer.      Tobacco:        Alcohol:   Caffeine use: Moderate amount. Exercise: Inactive.   Family History  Problem Relation Age of Onset  . Breast cancer    . Hypertension    . Diabetes type II    . Heart disease Father     CAD/3  AMI in 37's   . Stroke Father     several  . Lung disease Father   .  Hypertension Mother   . Diabetes Mother   . Heart disease Mother   . Diabetes Sister   . Cancer Sister 21    Breast cancer  . Hypertension Brother        Objective:   Physical Exam  Nursing note and vitals reviewed. Constitutional: She is oriented to person, place, and time. She appears well-developed and well-nourished. No distress.  HENT:  Head: Normocephalic and atraumatic.  Right Ear: External ear normal.  Left Ear: External ear normal.  Nose: Nose normal.  Mouth/Throat: Oropharynx is clear and moist.  Eyes: Conjunctivae are normal. Pupils are equal, round, and reactive to light.  Neck: Normal range of motion. Neck supple. No JVD present. No thyromegaly present.  Cardiovascular: Normal rate, regular rhythm, normal heart sounds and intact distal pulses.  Exam reveals no gallop and no friction rub.   No murmur heard. Pulmonary/Chest: Effort normal and breath sounds normal. She has no wheezes. She has no rales.  Abdominal: Soft. Bowel sounds are normal. She exhibits no distension and no mass. There is no tenderness. There is no rebound and no guarding.  Lymphadenopathy:    She has no cervical adenopathy.  Neurological: She is alert and oriented to person, place, and time.  Skin: Skin is warm and dry. No rash noted. She is not diaphoretic.  Psychiatric: She has a normal mood and affect. Her behavior is normal.       Assessment & Plan:  Dyspnea on exertion - Plan: Ambulatory referral to Cardiology  Acute pelvic pain - Plan: Ambulatory referral to Gynecology  Mammogram abnormal  Abdominal pain, epigastric   1. DOE:  Chronic for past five years with acute worsening in past three months. Refer to cardiology for stress testing, echo.  Normal CXR 04/28/13. 2. Dyspareunia/pelvic pain:  Worsening; known uterine fibroids; no further having menses; refer to gynecology. S/p pelvic ultrasound 04/28/13. 3. Abnormal mammogram:  New.  Schedule additional views. 4.  Epigastric pain  abdomen: improved with one week of Prilosec. S/p abdominal u/s 04/28/13.  No orders of the defined types were placed in this encounter.   Nilda Simmer, M.D.  [facility] [address] [place], [place]  [address] [phone] phone [phone] fax

## 2013-05-26 ENCOUNTER — Ambulatory Visit: Payer: BC Managed Care – PPO | Admitting: Cardiovascular Disease

## 2013-06-01 ENCOUNTER — Ambulatory Visit: Payer: BC Managed Care – PPO | Admitting: Family Medicine

## 2013-06-03 ENCOUNTER — Ambulatory Visit (INDEPENDENT_AMBULATORY_CARE_PROVIDER_SITE_OTHER): Payer: BC Managed Care – PPO | Admitting: Cardiovascular Disease

## 2013-06-03 ENCOUNTER — Encounter: Payer: Self-pay | Admitting: Cardiovascular Disease

## 2013-06-03 VITALS — BP 117/77 | HR 81 | Ht 65.5 in | Wt 191.2 lb

## 2013-06-03 DIAGNOSIS — R0602 Shortness of breath: Secondary | ICD-10-CM

## 2013-06-03 DIAGNOSIS — R0789 Other chest pain: Secondary | ICD-10-CM

## 2013-06-03 NOTE — Assessment & Plan Note (Signed)
She has significant exertional dyspnea with occasional substernal tightness which could be angina equivalent. She does have family history of premature coronary artery disease. Baseline ECG is slightly abnormal with inferior T wave changes with minor ST depression in lead 3. I recommend evaluation with a treadmill nuclear stress test. Due to abnormal baseline ECG, a treadmill stress test on would probably have low specificity. If the stress test comes back normal, an exercise program will be recommended. An echocardiogram can be considered if symptoms persist. Recent chest x-ray was unremarkable.

## 2013-06-03 NOTE — Patient Instructions (Addendum)
Your physician has requested that you have an exercise stress myoview. For further information please visit [email or web address] Please follow instruction sheet, as given.  Follow up in 2 months.   [facility] MYOVIEW  Your caregiver has ordered a Stress Test with nuclear imaging. The purpose of this test is to evaluate the blood supply to your heart muscle. This procedure is referred to as a "Non-Invasive Stress Test." This is because other than having an IV started in your vein, nothing is inserted or "invades" your body. Cardiac stress tests are done to find areas of poor blood flow to the heart by determining the extent of coronary artery disease (CAD). Some patients exercise on a treadmill, which naturally increases the blood flow to your heart, while others who are  unable to walk on a treadmill due to physical limitations have a pharmacologic/chemical stress agent called Lexiscan . This medicine will mimic walking on a treadmill by temporarily increasing your coronary blood flow.   Please note: these test may take anywhere between 2-4 hours to complete  PLEASE REPORT TO [facility] MEDICAL MALL ENTRANCE  THE VOLUNTEERS AT THE FIRST DESK WILL DIRECT YOU WHERE TO GO  Date of Procedure:_________Monday 10/20/14____________________________  Arrival Time for Procedure:__________7:45 am____________________  Instructions regarding medication:   ____ : Hold diabetes medication morning of procedure  ____:  Hold betablocker(s) night before procedure and morning of procedure  ____:  Hold other medications as follows:_________________________________________________________________________________________________________________________________________________________________________________________________________________________________________________________________________________________  PLEASE NOTIFY THE OFFICE AT LEAST 24 HOURS IN ADVANCE IF YOU ARE UNABLE TO KEEP YOUR APPOINTMENT.  [phone] AND   PLEASE NOTIFY NUCLEAR MEDICINE AT [facility] AT LEAST 24 HOURS IN ADVANCE IF YOU ARE UNABLE TO KEEP YOUR APPOINTMENT. [phone]  How to prepare for your Myoview test:  1. Do not eat or drink after midnight 2. No caffeine for 24 hours prior to test 3. No smoking 24 hours prior to test. 4. Your medication may be taken with water.  If your doctor stopped a medication because of this test, do not take that medication. 5. Ladies, please do not wear dresses.  Skirts or pants are appropriate. Please wear a short sleeve shirt. 6. No perfume, cologne or lotion. 7. Wear comfortable walking shoes. No heels!

## 2013-06-03 NOTE — Progress Notes (Signed)
Primary care physician: Dr. Ailene Ards  HPI  This is a very pleasant 57 year old female who was referred by Dr. Katrinka Blazing for evaluation of exertional dyspnea and chest tightness. She had no previous cardiac history. She has known history of treated hyperlipidemia and hypothyroidism. She is a lifelong nonsmoker. She does have family history of premature coronary artery disease. Over the last few years, she has noticed worsening exertional dyspnea. This is currently happening with going up one or 2 flights of status or walking a block on a flat level. This is associated with palpitations and occasional substernal heaviness which usually last for only a few minutes. No orthopnea or PND. No lower extremity edema. She retired last year. She does not exercise on a regular basis although she stays active. There is no history of pulmonary disease.  Allergies  Allergen Reactions  . Fish Allergy   . Penicillins Rash     Current Outpatient Prescriptions on File Prior to Visit  Medication Sig Dispense Refill  . Calcium Carbonate-Vitamin D (CALCIUM 500 + D) 500-125 MG-UNIT TABS Take by mouth daily.      Marland Kitchen levothyroxine (SYNTHROID, LEVOTHROID) 50 MCG tablet Take 50 mcg by mouth daily.      . simvastatin (ZOCOR) 20 MG tablet TAKE 1 TABLET BY MOUTH EVERY EVENING  30 tablet  5   No current facility-administered medications on file prior to visit.     Past Medical History  Diagnosis Date  . Leiomyoma of uterus, unspecified   . Pain in joint, multiple sites   . Candidiasis of vulva and vagina   . Pure hypercholesterolemia   . Postsurgical hypothyroidism   . Unspecified vitamin D deficiency   . Papanicolaou smear of cervix with atypical squamous cells of undetermined significance (ASC-US)   . Pain in joint, shoulder region   . Ventral hernia, unspecified, without mention of obstruction or gangrene   . Menopause age 63     Past Surgical History  Procedure Laterality Date  . Tonsillectomy and  adenoidectomy  1960's  . Breast biopsy  2006    Right  Benign  . Total thyroidectomy  2008     Family History  Problem Relation Age of Onset  . Breast cancer    . Hypertension    . Diabetes type II    . Heart disease Father     CAD/3  AMI in 35's   . Stroke Father     several  . Lung disease Father   . Hypertension Mother   . Diabetes Mother   . Heart disease Mother   . Diabetes Sister   . Cancer Sister 90    Breast cancer  . Hypertension Brother      History   Social History  . Marital Status: Married    Spouse Name: N/A    Number of Children: 0  . Years of Education: college   Occupational History  . [employer]   Social History Main Topics  . Smoking status: Never Smoker   . Smokeless tobacco: Not on file  . Alcohol Use: No  . Drug Use: No  . Sexual Activity: Not on file   Other Topics Concern  . Not on file   Social History Narrative   Married x 14 years; Happily, no abuse. Husband is a Education officer, environmental.       Children: none      Lives: with husband.      Employment:  Retire 06/2012 from [employer] after 35 years; wants  to volunteer.      Tobacco:        Alcohol:   Caffeine use: Moderate amount. Exercise: Inactive.     ROS A 10 point review of system was performed. It is negative other than that mentioned in the history of present illness.   PHYSICAL EXAM   BP 117/77  Pulse 81  Ht 5' 5.5" (1.664 m)  Wt 191 lb 4 oz (86.75 kg)  BMI 31.33 kg/m2 Constitutional: She is oriented to person, place, and time. She appears well-developed and well-nourished. No distress.  HENT: No nasal discharge.  Head: Normocephalic and atraumatic.  Eyes: Pupils are equal and round. No discharge.  Neck: Normal range of motion. Neck supple. No JVD present. No thyromegaly present.  Cardiovascular: Normal rate, regular rhythm, normal heart sounds. Exam reveals no gallop and no friction rub. No murmur heard.  Pulmonary/Chest: Effort normal and breath sounds normal. No  stridor. No respiratory distress. She has no wheezes. She has no rales. She exhibits no tenderness.  Abdominal: Soft. Bowel sounds are normal. She exhibits no distension. There is no tenderness. There is no rebound and no guarding.  Musculoskeletal: Normal range of motion. She exhibits no edema and no tenderness.  Neurological: She is alert and oriented to person, place, and time. Coordination normal.  Skin: Skin is warm and dry. No rash noted. She is not diaphoretic. No erythema. No pallor.  Psychiatric: She has a normal mood and affect. Her behavior is normal. Judgment and thought content normal.     OZH:[id number]  Rhythm  -  Nonspecific inferior T wave changes with minor ST depression in lead 3  ABNORMAL    ASSESSMENT AND PLAN

## 2013-06-04 ENCOUNTER — Ambulatory Visit: Payer: Self-pay | Admitting: Family Medicine

## 2013-06-07 ENCOUNTER — Ambulatory Visit: Payer: Self-pay | Admitting: Cardiovascular Disease

## 2013-06-07 DIAGNOSIS — R079 Chest pain, unspecified: Secondary | ICD-10-CM

## 2013-06-08 ENCOUNTER — Other Ambulatory Visit: Payer: Self-pay

## 2013-06-08 DIAGNOSIS — R0602 Shortness of breath: Secondary | ICD-10-CM

## 2013-06-08 DIAGNOSIS — R0789 Other chest pain: Secondary | ICD-10-CM

## 2013-06-10 ENCOUNTER — Telehealth: Payer: Self-pay | Admitting: *Deleted

## 2013-06-10 NOTE — Telephone Encounter (Signed)
Patient called wanting stress test results. Thanks

## 2013-06-10 NOTE — Telephone Encounter (Signed)
Spoke w/ pt.  She states that she would like a "specific exercise program from Dr. Kirke Corin". States that she does not want something generic, she wants it from Dr. Kirke Corin directly.  Please advise.  Thank you.

## 2013-06-10 NOTE — Telephone Encounter (Signed)
Left message on machine for pt to call back.

## 2013-06-10 NOTE — Telephone Encounter (Signed)
aerobic exercise is best for cardiac health. This can be in form of brisk walking, treadmill, bike or elliptical. She should exercise for a minimal of 30 minutes at least 4 times/week. Start slow and build up. She should monitor heart rate and keep her heart between 120-135 during exercise. She should avoid a heart rate >140 bpm.

## 2013-06-11 NOTE — Telephone Encounter (Signed)
Spoke w/ pt.  She verbalizes understanding of instructions and will call back with any questions or concerns.

## 2013-06-11 NOTE — Telephone Encounter (Signed)
Spoke w/ pt.  She is driving and would like me to send instructions via MyChart. Pt is not currently signed up for MyChart.  Will call later when pt is at home.

## 2013-07-07 ENCOUNTER — Ambulatory Visit: Payer: BC Managed Care – PPO | Admitting: Family Medicine

## 2013-07-13 ENCOUNTER — Encounter: Payer: Self-pay | Admitting: Family Medicine

## 2013-07-23 ENCOUNTER — Ambulatory Visit: Payer: BC Managed Care – PPO | Admitting: Cardiovascular Disease

## 2013-11-20 ENCOUNTER — Other Ambulatory Visit: Payer: Self-pay | Admitting: Family Medicine

## 2013-12-30 ENCOUNTER — Encounter: Payer: Self-pay | Admitting: Family Medicine

## 2014-01-03 ENCOUNTER — Other Ambulatory Visit: Payer: Self-pay | Admitting: Family Medicine

## 2014-01-08 ENCOUNTER — Other Ambulatory Visit: Payer: Self-pay | Admitting: Physician Assistant

## 2014-01-08 NOTE — Telephone Encounter (Signed)
PATIENT CALLED ABOUT REFILL FOR HIGH CHOLESTEROL MEDICATION.  NEEDS AUTHORIZATION FOR REFILL AT [facility] IN [place], [place]

## 2014-01-28 ENCOUNTER — Telehealth: Payer: Self-pay

## 2014-01-28 MED ORDER — SIMVASTATIN 20 MG PO TABS: 20.0000 mg | ORAL_TABLET | Freq: Every day | ORAL | Status: DC

## 2014-01-28 NOTE — Telephone Encounter (Signed)
Spoke with pt. Let her know that she needed an appt with Dr. Tamala Julian. Got her an appt set up for July and sent her in another 30d supply of simvastatin.

## 2014-01-28 NOTE — Telephone Encounter (Signed)
Patient is leaving the country driving as we speak, she needs a refill for simvastatin (ZOCOR) 20 MG tablet States she took her last pill today and realized there were no refills.  Last seen by Dr. Tamala Julian   [phone]

## 2014-03-09 ENCOUNTER — Encounter: Payer: Self-pay | Admitting: Family Medicine

## 2014-03-09 ENCOUNTER — Ambulatory Visit (INDEPENDENT_AMBULATORY_CARE_PROVIDER_SITE_OTHER): Payer: BC Managed Care – PPO | Admitting: Family Medicine

## 2014-03-09 VITALS — BP 110/64 | HR 61 | Temp 98.1°F | Resp 16 | Ht 66.0 in | Wt 187.2 lb

## 2014-03-09 DIAGNOSIS — R7309 Other abnormal glucose: Secondary | ICD-10-CM

## 2014-03-09 DIAGNOSIS — IMO0002 Reserved for concepts with insufficient information to code with codable children: Secondary | ICD-10-CM

## 2014-03-09 DIAGNOSIS — E7800 Pure hypercholesterolemia, unspecified: Secondary | ICD-10-CM

## 2014-03-09 DIAGNOSIS — R0602 Shortness of breath: Secondary | ICD-10-CM

## 2014-03-09 DIAGNOSIS — D259 Leiomyoma of uterus, unspecified: Secondary | ICD-10-CM

## 2014-03-09 LAB — COMPLETE METABOLIC PANEL WITH GFR
ALBUMIN: 4 g/dL (ref 3.5–5.2)
ALK PHOS: 62 U/L (ref 39–117)
ALT: 18 U/L (ref 0–35)
AST: 17 U/L (ref 0–37)
BILIRUBIN TOTAL: 0.3 mg/dL (ref 0.2–1.2)
BUN: 13 mg/dL (ref 6–23)
CO2: 30 mEq/L (ref 19–32)
Calcium: 10.1 mg/dL (ref 8.4–10.5)
Chloride: 105 mEq/L (ref 96–112)
Creat: 0.86 mg/dL (ref 0.50–1.10)
GFR, EST AFRICAN AMERICAN: 85 mL/min
GFR, EST NON AFRICAN AMERICAN: 74 mL/min
GLUCOSE: 90 mg/dL (ref 70–99)
Potassium: 4.1 mEq/L (ref 3.5–5.3)
Sodium: 140 mEq/L (ref 135–145)
Total Protein: 7.1 g/dL (ref 6.0–8.3)

## 2014-03-09 LAB — LIPID PANEL
CHOLESTEROL: 239 mg/dL — AB (ref 0–200)
HDL: 48 mg/dL (ref >39)
LDL CALC: 152 mg/dL — AB (ref 0–99)
TRIGLYCERIDES: 197 mg/dL — AB (ref <150)
Total CHOL/HDL Ratio: 5 Ratio
VLDL: 39 mg/dL (ref 0–40)

## 2014-03-09 LAB — CBC WITH DIFFERENTIAL/PLATELET
BASOS PCT: 1 % (ref 0–1)
Basophils Absolute: 0.1 10*3/uL (ref 0.0–0.1)
EOS ABS: 0.1 10*3/uL (ref 0.0–0.7)
EOS PCT: 1 % (ref 0–5)
HEMATOCRIT: 40.8 % (ref 36.0–46.0)
HEMOGLOBIN: 13.7 g/dL (ref 12.0–15.0)
Lymphocytes Relative: 46 % (ref 12–46)
Lymphs Abs: 2.3 10*3/uL (ref 0.7–4.0)
MCH: 27.7 pg (ref 26.0–34.0)
MCHC: 33.6 g/dL (ref 30.0–36.0)
MCV: 82.4 fL (ref 78.0–100.0)
MONO ABS: 0.4 10*3/uL (ref 0.1–1.0)
MONOS PCT: 7 % (ref 3–12)
Neutro Abs: 2.3 10*3/uL (ref 1.7–7.7)
Neutrophils Relative %: 45 % (ref 43–77)
Platelets: 278 10*3/uL (ref 150–400)
RBC: 4.95 MIL/uL (ref 3.87–5.11)
RDW: 15.2 % (ref 11.5–15.5)
WBC: 5 10*3/uL (ref 4.0–10.5)

## 2014-03-09 LAB — HEMOGLOBIN A1C
HEMOGLOBIN A1C: 6.2 % — AB (ref <5.7)
Mean Plasma Glucose: 131 mg/dL — ABNORMAL HIGH (ref <117)

## 2014-03-09 MED ORDER — SIMVASTATIN 20 MG PO TABS: 20.0000 mg | ORAL_TABLET | Freq: Every day | ORAL | Status: DC

## 2014-03-09 NOTE — Progress Notes (Signed)
Subjective:  This chart was scribed for Reginia Forts, MD by Roxan Diesel, Scribe.  This patient was seen in [place] [facility] and the patient's care was started at 1:38 PM.   Patient ID: Stacey Cline, female    DOB: 11/04/1955, 58 y.o.   MRN: [id number]  03/09/2014  Follow-up, abnormal glucose, would like fibroids checked and right breast tenderness and Medication Refill   HPI  HPI Comments: Stacey Cline is a 58 y.o. female who presents to [facility] for f/u on abnormal glucose and hypercholesterolemia.  She is fasting today.  Shortness of breath: At her last visit she was having SOB on exertion.  She followed up with a cardiologist who told her she was told she was "just out of shape" but otherwise her workup was unconcerning.  She has not been exercising very much since then.    Hypercholesterolemia: She was taking her cholesterol medication every day but ran out one week ago and could not get a refill without coming in for a recheck.  Denies CP/palp/SOB/leg swelling. Denies HA/dizziness/focal weakness/paresthesias.  Upper abdominal pain: Now resolved.  She denies nausea, vomiting, acid reflux, constipation or diarrhea.  Denies bloody stools or melena.  Fibroids:  She was referred to OB/GYN but did not go after last visit.  However she does note that her pain with sexual intercourse has grown more severe.  Pain is with both deep and shallow penetration.  She denies any vaginal dryness.  Denies vaginal bleeding.  Right breast tenderness:  She has a h/o abnormal mammogram finding; repeat mammogram normal.  She last saw her thyroid doctor several weeks ago.  Is being followed every three months.   Review of Systems  Constitutional: Negative for fever, chills, diaphoresis, activity change, appetite change and fatigue.  Respiratory: Negative for cough, shortness of breath, wheezing and stridor.   Cardiovascular: Negative for chest pain, palpitations and leg swelling.    Gastrointestinal: Negative for nausea, vomiting, abdominal pain, diarrhea, constipation, blood in stool, abdominal distention, anal bleeding and rectal pain.  Endocrine: Negative for cold intolerance, heat intolerance, polydipsia, polyphagia and polyuria.  Genitourinary: Positive for vaginal pain, pelvic pain and dyspareunia. Negative for urgency, decreased urine volume, vaginal bleeding, vaginal discharge and genital sores.  Neurological: Negative for dizziness, tremors, seizures, syncope, facial asymmetry, speech difficulty, weakness, light-headedness, numbness and headaches.    Past Medical History  Diagnosis Date  . Leiomyoma of uterus, unspecified   . Pain in joint, multiple sites   . Candidiasis of vulva and vagina   . Pure hypercholesterolemia   . Postsurgical hypothyroidism   . Unspecified vitamin D deficiency   . Papanicolaou smear of cervix with atypical squamous cells of undetermined significance (ASC-US)   . Pain in joint, shoulder region   . Ventral hernia, unspecified, without mention of obstruction or gangrene   . Menopause age 36   Past Surgical History  Procedure Laterality Date  . Tonsillectomy and adenoidectomy  1960's  . Breast biopsy  2006    Right  Benign  . Total thyroidectomy  2008   Allergies  Allergen Reactions  . Fish Allergy   . Penicillins Rash   Current Outpatient Prescriptions  Medication Sig Dispense Refill  . Calcium Carbonate-Vitamin D (CALCIUM 500 + D) 500-125 MG-UNIT TABS Take by mouth daily.      Marland Kitchen levothyroxine (SYNTHROID, LEVOTHROID) 50 MCG tablet Take 50 mcg by mouth daily.      . simvastatin (ZOCOR) 20 MG tablet Take 1 tablet (20 mg total) by  mouth daily.  30 tablet  5   No current facility-administered medications for this visit.   History   Social History  . Marital Status: Married    Spouse Name: N/A    Number of Children: 0  . Years of Education: college   Occupational History  . [employer]   Social History  Main Topics  . Smoking status: Never Smoker   . Smokeless tobacco: Not on file  . Alcohol Use: No  . Drug Use: No  . Sexual Activity: Not on file   Other Topics Concern  . Not on file   Social History Narrative   Married x 14 years; Happily, no abuse. Husband is a Theme park manager.       Children: none      Lives: with husband.      Employment:  Retire 06/2012 from [employer] after 35 years; wants to volunteer.      Tobacco:        Alcohol:   Caffeine use: Moderate amount. Exercise: Inactive.   Family History  Problem Relation Age of Onset  . Breast cancer    . Hypertension    . Diabetes type II    . Heart disease Father     CAD/3  AMI in 38's   . Stroke Father     several  . Lung disease Father   . Hypertension Mother   . Diabetes Mother   . Heart disease Mother   . Diabetes Sister   . Cancer Sister 81    Breast cancer  . Hypertension Brother        Objective:    BP 110/64  Pulse 61  Temp(Src) 98.1 F (36.7 C) (Oral)  Resp 16  Ht $R'5\' 6"'vC$  (1.676 m)  Wt 187 lb 3.2 oz (84.913 kg)  BMI 30.23 kg/m2  SpO2 99%  Physical Exam  Nursing note and vitals reviewed. Constitutional: She is oriented to person, place, and time. She appears well-developed and well-nourished. No distress.  HENT:  Head: Normocephalic and atraumatic.  Right Ear: External ear normal.  Left Ear: External ear normal.  Nose: Nose normal.  Mouth/Throat: Oropharynx is clear and moist. No oropharyngeal exudate.  Eyes: Conjunctivae and EOM are normal. Pupils are equal, round, and reactive to light.  Neck: Normal range of motion. Neck supple. Carotid bruit is not present. No tracheal deviation present. No thyromegaly present.  Cardiovascular: Normal rate, regular rhythm, normal heart sounds and intact distal pulses.  Exam reveals no gallop and no friction rub.   No murmur heard. Pulmonary/Chest: Effort normal and breath sounds normal. No respiratory distress. She has no wheezes. She has no rales. Right breast exhibits  tenderness. Right breast exhibits no inverted nipple, no mass, no nipple discharge and no skin change. Left breast exhibits no inverted nipple, no mass, no nipple discharge, no skin change and no tenderness. Breasts are symmetrical.    Breasts normal; area marked TTP.  Abdominal: Soft. Bowel sounds are normal. She exhibits no distension and no mass. There is no tenderness. There is no rebound and no guarding.  Genitourinary: Vagina normal. There is no rash, tenderness or lesion on the right labia. There is no rash, tenderness or lesion on the left labia. Uterus is enlarged. Cervix exhibits no motion tenderness, no discharge and no friability. Right adnexum displays no mass, no tenderness and no fullness. Left adnexum displays no mass, no tenderness and no fullness. No erythema or bleeding around the vagina. No foreign body around the vagina.  No vaginal discharge found.  Musculoskeletal: Normal range of motion.  Lymphadenopathy:    She has no cervical adenopathy.  Neurological: She is alert and oriented to person, place, and time. No cranial nerve deficit.  Skin: Skin is warm and dry. No rash noted. She is not diaphoretic. No erythema. No pallor.  Psychiatric: She has a normal mood and affect. Her behavior is normal.   Results for orders placed in visit on 03/09/14  CBC WITH DIFFERENTIAL      Result Value Ref Range   WBC 5.0  4.0 - 10.5 K/uL   RBC 4.95  3.87 - 5.11 MIL/uL   Hemoglobin 13.7  12.0 - 15.0 g/dL   HCT 40.8  36.0 - 46.0 %   MCV 82.4  78.0 - 100.0 fL   MCH 27.7  26.0 - 34.0 pg   MCHC 33.6  30.0 - 36.0 g/dL   RDW 15.2  11.5 - 15.5 %   Platelets 278  150 - 400 K/uL   Neutrophils Relative % 45  43 - 77 %   Neutro Abs 2.3  1.7 - 7.7 K/uL   Lymphocytes Relative 46  12 - 46 %   Lymphs Abs 2.3  0.7 - 4.0 K/uL   Monocytes Relative 7  3 - 12 %   Monocytes Absolute 0.4  0.1 - 1.0 K/uL   Eosinophils Relative 1  0 - 5 %   Eosinophils Absolute 0.1  0.0 - 0.7 K/uL   Basophils Relative 1   0 - 1 %   Basophils Absolute 0.1  0.0 - 0.1 K/uL   Smear Review Criteria for review not met    COMPLETE METABOLIC PANEL WITH GFR      Result Value Ref Range   Sodium 140  135 - 145 mEq/L   Potassium 4.1  3.5 - 5.3 mEq/L   Chloride 105  96 - 112 mEq/L   CO2 30  19 - 32 mEq/L   Glucose, Bld 90  70 - 99 mg/dL   BUN 13  6 - 23 mg/dL   Creat 0.86  0.50 - 1.10 mg/dL   Total Bilirubin 0.3  0.2 - 1.2 mg/dL   Alkaline Phosphatase 62  39 - 117 U/L   AST 17  0 - 37 U/L   ALT 18  0 - 35 U/L   Total Protein 7.1  6.0 - 8.3 g/dL   Albumin 4.0  3.5 - 5.2 g/dL   Calcium 10.1  8.4 - 10.5 mg/dL   GFR, Est African American 85     GFR, Est Non African American 74    HEMOGLOBIN A1C      Result Value Ref Range   Hemoglobin A1C 6.2 (*) <5.7 %   Mean Plasma Glucose 131 (*) <117 mg/dL  LIPID PANEL      Result Value Ref Range   Cholesterol 239 (*) 0 - 200 mg/dL   Triglycerides 197 (*) <150 mg/dL   HDL 48  >39 mg/dL   Total CHOL/HDL Ratio 5.0     VLDL 39  0 - 40 mg/dL   LDL Cholesterol 152 (*) 0 - 99 mg/dL       Assessment & Plan:   1. Pure hypercholesterolemia   2. Other abnormal glucose   3. SOB (shortness of breath)   4. Dyspareunia   5. Uterine leiomyoma, unspecified location    1. Hypercholesterolemia: uncontrolled due to recent non-compliance with follow-up and medication; refill provided.  RTC six months for reevaluation. 2.  Glucose intolerance: stable; persistent state; recommend weight loss, exercise, dietary modification. 3.  SOB/DOE: persistent; s/p cardiology consultation; deconditioning felt to be source of DOE. 4.  Dyspareunia: worsening; feel secondary to atrophic vaginitis but patient doubtful of diagnosis. Current dyspareunia unlikely due to fibroids; refer to gynecology for further consultation. 5. Uterine fibroids: persistent; s/p pelvic u/s in 05/2013 with fibroids noted.  Refer to gynecology for further consultation; post-menopausal at this time; thus, surgical resection not  indicated.  Meds ordered this encounter  Medications  . simvastatin (ZOCOR) 20 MG tablet    Sig: Take 1 tablet (20 mg total) by mouth daily.    Dispense:  30 tablet    Refill:  5    Return in about 6 months (around 09/09/2014) for complete physical examiniation.    I personally performed the services described in this documentation, which was scribed in my presence.  The recorded information has been reviewed and is accurate.  Reginia Forts, M.D.  [facility] [address] [place], [place]  [address] [phone] phone [phone] fax

## 2014-03-11 ENCOUNTER — Telehealth: Payer: Self-pay

## 2014-03-11 NOTE — Telephone Encounter (Signed)
Please advise lab results. Will call pt with results and suggestions for ankle pain at the same time.

## 2014-03-11 NOTE — Telephone Encounter (Signed)
Pt fell at church yesterday and hurt her ankle. She is very intent on speaking with Dr. Tamala Julian to see how she can treat this. I told the pt that it is likely she will need to come in and be examined. She said this was not an option because she does not have transportation. She insisted on explaining the situation to Dr. Tamala Julian over the phone. I told her we would do what we could. Pt is also waiting to hear the lab results from her last visit!

## 2014-03-12 ENCOUNTER — Ambulatory Visit (INDEPENDENT_AMBULATORY_CARE_PROVIDER_SITE_OTHER): Payer: BC Managed Care – PPO | Admitting: Family Medicine

## 2014-03-12 ENCOUNTER — Ambulatory Visit (INDEPENDENT_AMBULATORY_CARE_PROVIDER_SITE_OTHER): Payer: BC Managed Care – PPO

## 2014-03-12 VITALS — BP 136/78 | HR 83 | Temp 98.2°F | Resp 16 | Ht 66.0 in | Wt 187.0 lb

## 2014-03-12 DIAGNOSIS — M25579 Pain in unspecified ankle and joints of unspecified foot: Secondary | ICD-10-CM

## 2014-03-12 DIAGNOSIS — S93601A Unspecified sprain of right foot, initial encounter: Secondary | ICD-10-CM

## 2014-03-12 DIAGNOSIS — M79671 Pain in right foot: Secondary | ICD-10-CM

## 2014-03-12 DIAGNOSIS — S93409A Sprain of unspecified ligament of unspecified ankle, initial encounter: Secondary | ICD-10-CM

## 2014-03-12 DIAGNOSIS — M79609 Pain in unspecified limb: Secondary | ICD-10-CM

## 2014-03-12 DIAGNOSIS — M25571 Pain in right ankle and joints of right foot: Secondary | ICD-10-CM

## 2014-03-12 DIAGNOSIS — S93401A Sprain of unspecified ligament of right ankle, initial encounter: Secondary | ICD-10-CM

## 2014-03-12 DIAGNOSIS — S93609A Unspecified sprain of unspecified foot, initial encounter: Secondary | ICD-10-CM

## 2014-03-12 MED ORDER — HYDROCODONE-ACETAMINOPHEN 5-325 MG PO TABS: 1.0000 | ORAL_TABLET | ORAL | Status: DC | PRN

## 2014-03-12 NOTE — Progress Notes (Signed)
Subjective:    Patient ID: Stacey Cline, female    DOB: Mar 19, 1956, 58 y.o.   MRN: [id number]  03/12/2014  Foot Pain   HPI This 58 y.o. female presents for acute visit for R foot pain after an injury two days ago.  At church and tripped; unknown mechanism of injury.  Acute onset of R foot and ankle pain.  +swelling of R foot today has worsened due to lack of elevation of foot or icing.  +pain and unable to bear weight.  No n/t of foot.  No cyanosis of foot.  Has been icing and soaking foot in warm water. Has not taken any medication.     Review of Systems  Constitutional: Negative for fever, chills, diaphoresis and fatigue.  Musculoskeletal: Positive for arthralgias, gait problem and joint swelling.  Skin: Negative for color change, rash and wound.  Neurological: Positive for weakness. Negative for numbness.    Past Medical History  Diagnosis Date  . Leiomyoma of uterus, unspecified   . Pain in joint, multiple sites   . Candidiasis of vulva and vagina   . Pure hypercholesterolemia   . Postsurgical hypothyroidism   . Unspecified vitamin D deficiency   . Papanicolaou smear of cervix with atypical squamous cells of undetermined significance (ASC-US)   . Pain in joint, shoulder region   . Ventral hernia, unspecified, without mention of obstruction or gangrene   . Menopause age 89   Past Surgical History  Procedure Laterality Date  . Tonsillectomy and adenoidectomy  1960's  . Breast biopsy  2006    Right  Benign  . Total thyroidectomy  2008   Allergies  Allergen Reactions  . Fish Allergy   . Penicillins Rash   Current Outpatient Prescriptions  Medication Sig Dispense Refill  . Calcium Carbonate-Vitamin D (CALCIUM 500 + D) 500-125 MG-UNIT TABS Take by mouth daily.      Marland Kitchen levothyroxine (SYNTHROID, LEVOTHROID) 50 MCG tablet Take 50 mcg by mouth daily.      . simvastatin (ZOCOR) 20 MG tablet Take 1 tablet (20 mg total) by mouth daily.  30 tablet  5  .  HYDROcodone-acetaminophen (NORCO/VICODIN) 5-325 MG per tablet Take 1 tablet by mouth every 4 (four) hours as needed for moderate pain.  30 tablet  0   No current facility-administered medications for this visit.       Objective:    BP 136/78  Pulse 83  Temp(Src) 98.2 F (36.8 C) (Oral)  Resp 16  Ht _0  (1.676 m)  Wt 187 lb (84.823 kg)  BMI 30.20 kg/m2  SpO2 96% Physical Exam  Nursing note and vitals reviewed. Constitutional: She is oriented to person, place, and time. She appears well-developed and well-nourished. No distress.  HENT:  Head: Normocephalic and atraumatic.  Eyes: Conjunctivae are normal. Pupils are equal, round, and reactive to light.  Neck: Normal range of motion. Neck supple.  Cardiovascular: Normal rate, regular rhythm, normal heart sounds and intact distal pulses.  Exam reveals no gallop and no friction rub.   No murmur heard. Capillary refill< 3 seconds R foot; DP pulse 1+ and equal R foot.  Pulmonary/Chest: Effort normal and breath sounds normal. She has no wheezes. She has no rales.  Musculoskeletal:       Right knee: Normal.       Right ankle: She exhibits decreased range of motion and swelling. She exhibits no ecchymosis, no deformity, no laceration and normal pulse. Tenderness. Head of 5th metatarsal tenderness found. No lateral  malleolus, no medial malleolus and no proximal fibula tenderness found. Achilles tendon normal. Achilles tendon exhibits no pain, no defect and normal Thompson's test results.       Right lower leg: Normal. She exhibits no tenderness, no bony tenderness, no edema, no deformity and no laceration.       Right foot: She exhibits decreased range of motion, tenderness, bony tenderness and swelling. She exhibits normal capillary refill, no crepitus, no deformity and no laceration.  R FOOT:  +TTP proximal foot along proximal fifth metatarsal; diffuse swelling proximal foot; +ecchymoses proximal foot.  R ANKLE:  Mild swelling lateral and  medial ankle; +TTP anterior ankle.    Neurological: She is alert and oriented to person, place, and time.  Skin: She is not diaphoretic.  Psychiatric: She has a normal mood and affect. Her behavior is normal.   Results for orders placed in visit on 03/09/14  CBC WITH DIFFERENTIAL      Result Value Ref Range   WBC 5.0  4.0 - 10.5 K/uL   RBC 4.95  3.87 - 5.11 MIL/uL   Hemoglobin 13.7  12.0 - 15.0 g/dL   HCT 40.8  36.0 - 46.0 %   MCV 82.4  78.0 - 100.0 fL   MCH 27.7  26.0 - 34.0 pg   MCHC 33.6  30.0 - 36.0 g/dL   RDW 15.2  11.5 - 15.5 %   Platelets 278  150 - 400 K/uL   Neutrophils Relative % 45  43 - 77 %   Neutro Abs 2.3  1.7 - 7.7 K/uL   Lymphocytes Relative 46  12 - 46 %   Lymphs Abs 2.3  0.7 - 4.0 K/uL   Monocytes Relative 7  3 - 12 %   Monocytes Absolute 0.4  0.1 - 1.0 K/uL   Eosinophils Relative 1  0 - 5 %   Eosinophils Absolute 0.1  0.0 - 0.7 K/uL   Basophils Relative 1  0 - 1 %   Basophils Absolute 0.1  0.0 - 0.1 K/uL   Smear Review Criteria for review not met    COMPLETE METABOLIC PANEL WITH GFR      Result Value Ref Range   Sodium 140  135 - 145 mEq/L   Potassium 4.1  3.5 - 5.3 mEq/L   Chloride 105  96 - 112 mEq/L   CO2 30  19 - 32 mEq/L   Glucose, Bld 90  70 - 99 mg/dL   BUN 13  6 - 23 mg/dL   Creat 0.86  0.50 - 1.10 mg/dL   Total Bilirubin 0.3  0.2 - 1.2 mg/dL   Alkaline Phosphatase 62  39 - 117 U/L   AST 17  0 - 37 U/L   ALT 18  0 - 35 U/L   Total Protein 7.1  6.0 - 8.3 g/dL   Albumin 4.0  3.5 - 5.2 g/dL   Calcium 10.1  8.4 - 10.5 mg/dL   GFR, Est African American 85     GFR, Est Non African American 74    HEMOGLOBIN A1C      Result Value Ref Range   Hemoglobin A1C 6.2 (*) <5.7 %   Mean Plasma Glucose 131 (*) <117 mg/dL  LIPID PANEL      Result Value Ref Range   Cholesterol 239 (*) 0 - 200 mg/dL   Triglycerides 197 (*) <150 mg/dL   HDL 48  >39 mg/dL   Total CHOL/HDL Ratio 5.0     VLDL  39  0 - 40 mg/dL   LDL Cholesterol 152 (*) 0 - 99 mg/dL   [facility]  reading (PRIMARY) by  Dr. Tamala Julian.  R FOOT: NAD; R ANKLE: NAD.      Assessment & Plan:   1. Pain in right foot   2. Pain in right ankle   3. Sprain of right ankle, initial encounter   4. Sprain of right foot, initial encounter     1. Pain R foot and ankle: recommend Ibuprofen tid scheduled; rx for Hydrocodone also provided for pain control.  Avoid driving with hydrocodone. 2.  Sprain R ankle/foot:  New.  CAM walker for the next week; Crutches for the next 72 hours and then attempt to wean crutches; RTC 5-7 days if unable to bear weight without crutches; wear CAM walker for next week and then wean to a supportive tennis shoe.  Recommend rest, icing, elevation.  RTC if not significantly improved in upcoming 7-10 days.   Trained on crutches use; crutches provided; CAM walker at home; did NOT provide with CAM walker.  Meds ordered this encounter  Medications  . HYDROcodone-acetaminophen (NORCO/VICODIN) 5-325 MG per tablet    Sig: Take 1 tablet by mouth every 4 (four) hours as needed for moderate pain.    Dispense:  30 tablet    Refill:  0    No Follow-up on file.    Reginia Forts, M.D.  [facility] [address] [place], [place]  [id number] [phone] phone [phone] fax

## 2014-03-12 NOTE — Patient Instructions (Signed)
1. RETURN IN 5-7 DAYS IF YOU ARE UNABLE TO BEAR WEIGHT ON YOUR RIGHT FOOT WITH THE BOOT. 2.  USE CRUTCHES FOR THE NEXT 3 DAYS; TRY TO WEAN THE CRUTCHES AND ONLY WEAR THE BOOT FOR ONE WEEK. 3.  IF YOU ARE NOT SIGNIFICANTLY IMPROVEMENT IN 7-10 DAYS, RETURN TO OFFICE. 4. ICE FOOT AND ANKLE 3 TIMES DAILY FOR 15-20 MINUTES EACH TIME. 5.  ELEVATE THE FOOT AS MUCH AS POSSIBLE. 6. AVOID HIGH HEELS FOR THE NEXT MONTH.

## 2014-03-15 NOTE — Telephone Encounter (Signed)
Patient evaluated in office on 03/12/14.

## 2014-03-21 ENCOUNTER — Ambulatory Visit (INDEPENDENT_AMBULATORY_CARE_PROVIDER_SITE_OTHER): Payer: BC Managed Care – PPO | Admitting: Family Medicine

## 2014-03-21 ENCOUNTER — Ambulatory Visit: Payer: BC Managed Care – PPO

## 2014-03-21 ENCOUNTER — Encounter: Payer: Self-pay | Admitting: Family Medicine

## 2014-03-21 ENCOUNTER — Ambulatory Visit (INDEPENDENT_AMBULATORY_CARE_PROVIDER_SITE_OTHER): Payer: BC Managed Care – PPO

## 2014-03-21 VITALS — BP 149/76 | HR 69 | Temp 98.8°F | Resp 16

## 2014-03-21 DIAGNOSIS — M79609 Pain in unspecified limb: Secondary | ICD-10-CM

## 2014-03-21 DIAGNOSIS — S93409A Sprain of unspecified ligament of unspecified ankle, initial encounter: Secondary | ICD-10-CM

## 2014-03-21 DIAGNOSIS — M79671 Pain in right foot: Secondary | ICD-10-CM

## 2014-03-21 DIAGNOSIS — S93401D Sprain of unspecified ligament of right ankle, subsequent encounter: Secondary | ICD-10-CM

## 2014-03-21 DIAGNOSIS — Z5189 Encounter for other specified aftercare: Secondary | ICD-10-CM

## 2014-03-21 DIAGNOSIS — M25571 Pain in right ankle and joints of right foot: Secondary | ICD-10-CM

## 2014-03-21 DIAGNOSIS — M25579 Pain in unspecified ankle and joints of unspecified foot: Secondary | ICD-10-CM

## 2014-03-21 NOTE — Progress Notes (Signed)
Patient ID: Stacey Cline, female   DOB: 20-Mar-1956, 58 y.o.   MRN: [id number]   Subjective:  This chart was scribed for Reginia Forts, MD by Donato Schultz, Medical Scribe. This patient was seen in Room 26 and the patient's care was started at 3:01 PM.   Patient ID: Stacey Cline, female    DOB: [birth year], 58 y.o.   MRN: [id number]  03/21/2014  Follow-up  HPI HPI Comments: Stacey Cline is a 58 y.o. female who presents to the [facility] for a one week follow-up on a right ankle and foot sprain.  She is still unable to bear weight on her right foot without using crutches due to pain.  She wore her boot to church yesterday and states that it was aggravating.  She used her crutches with her boot but thinks she thinks she could walk just using her boot.  She denies numbness and tingling in her right foot as associated symptoms.  Swelling has persisted in R ankle and foot. Bruising along anterior ankle and lateral ankle developed after last visit.  Husband reports that patient has worn CAM walker twice only in past week.  "she wants there to be a broken bone" reports her husband.  No longer having pain at rest.  Only having pain with weightbearing.  Has been icing foot a lot.    Review of Systems  Constitutional: Negative for fever, chills, diaphoresis and fatigue.  Musculoskeletal: Positive for arthralgias, gait problem, joint swelling and myalgias.  Neurological: Negative for numbness.    Past Medical History  Diagnosis Date  . Leiomyoma of uterus, unspecified   . Pain in joint, multiple sites   . Candidiasis of vulva and vagina   . Pure hypercholesterolemia   . Postsurgical hypothyroidism   . Unspecified vitamin D deficiency   . Papanicolaou smear of cervix with atypical squamous cells of undetermined significance (ASC-US)   . Pain in joint, shoulder region   . Ventral hernia, unspecified, without mention of obstruction or gangrene   . Menopause age 58    Past Surgical History  Procedure Laterality Date  . Tonsillectomy and adenoidectomy  1960's  . Breast biopsy  2006    Right  Benign  . Total thyroidectomy  2008   Allergies  Allergen Reactions  . Fish Allergy   . Penicillins Rash   Current Outpatient Prescriptions  Medication Sig Dispense Refill  . Calcium Carbonate-Vitamin D (CALCIUM 500 + D) 500-125 MG-UNIT TABS Take by mouth daily.      Marland Kitchen HYDROcodone-acetaminophen (NORCO/VICODIN) 5-325 MG per tablet Take 1 tablet by mouth every 4 (four) hours as needed for moderate pain.  30 tablet  0  . levothyroxine (SYNTHROID, LEVOTHROID) 50 MCG tablet Take 50 mcg by mouth daily.      . simvastatin (ZOCOR) 20 MG tablet Take 1 tablet (20 mg total) by mouth daily.  30 tablet  5   No current facility-administered medications for this visit.    Objective:  BP 149/76  Pulse 69  Temp(Src) 98.8 F (37.1 C)  Resp 16  SpO2 97%  Physical Exam  Nursing note and vitals reviewed. Constitutional: She is oriented to person, place, and time. She appears well-developed and well-nourished.  HENT:  Head: Normocephalic and atraumatic.  Eyes: EOM are normal.  Neck: Normal range of motion.  Cardiovascular: Normal rate.   Pulmonary/Chest: Effort normal.  Musculoskeletal:       Right ankle: She exhibits decreased range of motion, swelling and ecchymosis. She exhibits  no deformity, no laceration and normal pulse. Tenderness. Lateral malleolus and head of 5th metatarsal tenderness found. No medial malleolus, no AITFL, no CF ligament, no posterior TFL and no proximal fibula tenderness found. Achilles tendon normal. Achilles tendon exhibits no pain and no defect.       Right lower leg: She exhibits no tenderness, no bony tenderness, no swelling, no edema, no deformity and no laceration.       Right foot: She exhibits decreased range of motion, tenderness and bony tenderness. She exhibits normal capillary refill, no crepitus, no deformity and no laceration.   No medial tenderness.  Anterior and lateral ankle tenderness.  Tender to palpation of the proximal third, fourth, and fifth metatarsal.  Dorsiflexion and plantar flexion without pain.  Internal and external rotation without pain.    Neurological: She is alert and oriented to person, place, and time.  Skin: Skin is warm and dry.  Psychiatric: She has a normal mood and affect. Her behavior is normal.     [facility] preliminary x-ray report read by Dr. Tamala Julian: R FOOT: NAD; R ANKLE: NAD.  Assessment & Plan:   1. Right ankle pain   2. Right foot pain   3. Right ankle sprain, subsequent encounter    1. R ankle/foot sprain: persistent; slow improvement; wean crutches today; wear CAM walker with all ambulation. If no improvement in one week, RTC for repeat imaging.  Other option is to refer to ortho.  Pt desires follow-up in one week at [facility] if no improvement.  Leaving for cruise in ten days, so highly encourage follow-up in one week if not significantly improved.    No orders of the defined types were placed in this encounter.    No Follow-up on file.   I personally performed the services described in this documentation, which was scribed in my presence.  The recorded information has been reviewed and is accurate.  Reginia Forts, M.D.  [facility] [address] [place], [place]  [address] [phone] phone [phone] fax

## 2014-03-30 ENCOUNTER — Ambulatory Visit: Payer: BC Managed Care – PPO | Admitting: Family Medicine

## 2014-07-01 ENCOUNTER — Ambulatory Visit: Payer: Self-pay | Admitting: Family Medicine

## 2014-07-01 LAB — HM MAMMOGRAPHY

## 2014-07-08 ENCOUNTER — Other Ambulatory Visit: Payer: Self-pay

## 2014-07-08 MED ORDER — SIMVASTATIN 20 MG PO TABS: 20.0000 mg | ORAL_TABLET | Freq: Every day | ORAL | Status: DC

## 2014-09-07 ENCOUNTER — Encounter: Payer: BC Managed Care – PPO | Admitting: Family Medicine

## 2014-09-12 ENCOUNTER — Encounter: Payer: BC Managed Care – PPO | Admitting: Family Medicine

## 2014-09-28 ENCOUNTER — Ambulatory Visit (INDEPENDENT_AMBULATORY_CARE_PROVIDER_SITE_OTHER): Payer: BLUE CROSS/BLUE SHIELD | Admitting: Family Medicine

## 2014-09-28 ENCOUNTER — Encounter: Payer: Self-pay | Admitting: Family Medicine

## 2014-09-28 VITALS — BP 147/87 | HR 63 | Temp 98.2°F | Resp 16 | Ht 66.75 in | Wt 189.0 lb

## 2014-09-28 DIAGNOSIS — R7302 Impaired glucose tolerance (oral): Secondary | ICD-10-CM | POA: Insufficient documentation

## 2014-09-28 DIAGNOSIS — E7800 Pure hypercholesterolemia, unspecified: Secondary | ICD-10-CM | POA: Insufficient documentation

## 2014-09-28 DIAGNOSIS — Z01419 Encounter for gynecological examination (general) (routine) without abnormal findings: Secondary | ICD-10-CM

## 2014-09-28 DIAGNOSIS — E038 Other specified hypothyroidism: Secondary | ICD-10-CM

## 2014-09-28 DIAGNOSIS — Z0000 Encounter for general adult medical examination without abnormal findings: Secondary | ICD-10-CM

## 2014-09-28 DIAGNOSIS — E034 Atrophy of thyroid (acquired): Secondary | ICD-10-CM | POA: Insufficient documentation

## 2014-09-28 DIAGNOSIS — E663 Overweight: Secondary | ICD-10-CM

## 2014-09-28 LAB — CBC WITH DIFFERENTIAL/PLATELET
Basophils Absolute: 0 10*3/uL (ref 0.0–0.1)
Basophils Relative: 0 % (ref 0–1)
EOS PCT: 1 % (ref 0–5)
Eosinophils Absolute: 0.1 10*3/uL (ref 0.0–0.7)
HCT: 42.6 % (ref 36.0–46.0)
HEMOGLOBIN: 14.2 g/dL (ref 12.0–15.0)
LYMPHS ABS: 2.4 10*3/uL (ref 0.7–4.0)
Lymphocytes Relative: 34 % (ref 12–46)
MCH: 28.1 pg (ref 26.0–34.0)
MCHC: 33.3 g/dL (ref 30.0–36.0)
MCV: 84.4 fL (ref 78.0–100.0)
MONO ABS: 0.4 10*3/uL (ref 0.1–1.0)
MONOS PCT: 6 % (ref 3–12)
MPV: 12 fL (ref 8.6–12.4)
NEUTROS PCT: 59 % (ref 43–77)
Neutro Abs: 4.2 10*3/uL (ref 1.7–7.7)
PLATELETS: 255 10*3/uL (ref 150–400)
RBC: 5.05 MIL/uL (ref 3.87–5.11)
RDW: 15.1 % (ref 11.5–15.5)
WBC: 7.1 10*3/uL (ref 4.0–10.5)

## 2014-09-28 LAB — LIPID PANEL
CHOLESTEROL: 203 mg/dL — AB (ref 0–200)
HDL: 48 mg/dL (ref >39)
LDL CALC: 132 mg/dL — AB (ref 0–99)
TRIGLYCERIDES: 116 mg/dL (ref <150)
Total CHOL/HDL Ratio: 4.2 Ratio
VLDL: 23 mg/dL (ref 0–40)

## 2014-09-28 LAB — POCT URINALYSIS DIPSTICK
Bilirubin, UA: NEGATIVE
Blood, UA: NEGATIVE
Glucose, UA: NEGATIVE
KETONES UA: NEGATIVE
LEUKOCYTES UA: NEGATIVE
Nitrite, UA: NEGATIVE
PH UA: 5
PROTEIN UA: NEGATIVE
SPEC GRAV UA: 1.025
UROBILINOGEN UA: 0.2

## 2014-09-28 LAB — COMPREHENSIVE METABOLIC PANEL
ALBUMIN: 4.4 g/dL (ref 3.5–5.2)
ALT: 21 U/L (ref 0–35)
AST: 22 U/L (ref 0–37)
Alkaline Phosphatase: 64 U/L (ref 39–117)
BUN: 13 mg/dL (ref 6–23)
CALCIUM: 9.3 mg/dL (ref 8.4–10.5)
CHLORIDE: 105 meq/L (ref 96–112)
CO2: 27 meq/L (ref 19–32)
Creat: 0.83 mg/dL (ref 0.50–1.10)
GLUCOSE: 87 mg/dL (ref 70–99)
Potassium: 4 mEq/L (ref 3.5–5.3)
SODIUM: 141 meq/L (ref 135–145)
TOTAL PROTEIN: 7.1 g/dL (ref 6.0–8.3)
Total Bilirubin: 0.5 mg/dL (ref 0.2–1.2)

## 2014-09-28 MED ORDER — SIMVASTATIN 20 MG PO TABS: 20.0000 mg | ORAL_TABLET | Freq: Every day | ORAL | Status: DC

## 2014-09-28 NOTE — Patient Instructions (Signed)

## 2014-09-28 NOTE — Progress Notes (Signed)
Subjective:    Patient ID: Stacey Cline, female    DOB: Jun 09, 1956, 59 y.o.   MRN: [id number]    HPI This 59 y.o. female presents for Complete Physical Examination.    Last physical:  06/24/2012 Pap smear:  06/24/2012 Mammogram:  07/12/2014 Colonoscopy:  2008; +polyps.  Repeat unsure.   Bone density:  Never.   TDAP:  2008 Zostavax:  Never; not interested.  Influenza:  Decline today. Eye exam:  3 years ago; new glasses; no glaucoma or cataracts. Dental exam:  Every six months.   R shoulder, upper back, breast: frequent sharp frequent pain.  Radiation into back.  Exercising three days per week.  Pain before starting to exercise.  Can go away for month and then will recur.  Review of Systems  Constitutional: Negative.  Negative for fever, chills, diaphoresis, activity change, appetite change, fatigue and unexpected weight change.  HENT: Negative.  Negative for congestion, dental problem, drooling, ear discharge, ear pain, facial swelling, hearing loss, mouth sores, nosebleeds, postnasal drip, rhinorrhea, sinus pressure, sneezing, sore throat, tinnitus, trouble swallowing and voice change.   Eyes: Negative.  Negative for photophobia, pain, discharge, redness, itching and visual disturbance.  Respiratory: Negative.  Negative for apnea, cough, choking, chest tightness, shortness of breath, wheezing and stridor.   Cardiovascular: Negative.  Negative for chest pain, palpitations and leg swelling.  Gastrointestinal: Negative.  Negative for nausea, vomiting, abdominal pain, diarrhea, constipation, blood in stool, abdominal distention, anal bleeding and rectal pain.  Endocrine: Negative.  Negative for cold intolerance, heat intolerance, polydipsia, polyphagia and polyuria.  Genitourinary: Negative.  Negative for dysuria, urgency, frequency, hematuria, flank pain, decreased urine volume, vaginal bleeding, vaginal discharge, enuresis, difficulty urinating, genital sores, vaginal pain,  menstrual problem, pelvic pain and dyspareunia.  Musculoskeletal: Negative.  Negative for myalgias, back pain, joint swelling, arthralgias, gait problem, neck pain and neck stiffness.  Skin: Negative.  Negative for color change, pallor, rash and wound.  Allergic/Immunologic: Negative.  Negative for environmental allergies, food allergies and immunocompromised state.  Neurological: Negative.  Negative for dizziness, tremors, seizures, syncope, facial asymmetry, speech difficulty, weakness, light-headedness, numbness and headaches.  Hematological: Negative.  Negative for adenopathy. Does not bruise/bleed easily.  Psychiatric/Behavioral: Negative.  Negative for suicidal ideas, hallucinations, behavioral problems, confusion, sleep disturbance, self-injury, dysphoric mood, decreased concentration and agitation. The patient is not nervous/anxious and is not hyperactive.        Objective:   Physical Exam  Constitutional: She is oriented to person, place, and time. She appears well-developed and well-nourished. No distress.  HENT:  Head: Normocephalic and atraumatic.  Right Ear: External ear normal.  Left Ear: External ear normal.  Nose: Nose normal.  Mouth/Throat: Oropharynx is clear and moist.  Eyes: Conjunctivae and EOM are normal. Pupils are equal, round, and reactive to light.  Neck: Normal range of motion and full passive range of motion without pain. Neck supple. No JVD present. Carotid bruit is not present. No thyromegaly present.  Cardiovascular: Normal rate, regular rhythm and normal heart sounds.  Exam reveals no gallop and no friction rub.   No murmur heard. Pulmonary/Chest: Effort normal and breath sounds normal. She has no wheezes. She has no rales. Right breast exhibits no inverted nipple, no mass, no nipple discharge, no skin change and no tenderness. Left breast exhibits no inverted nipple, no mass, no nipple discharge, no skin change and no tenderness. Breasts are symmetrical.    Abdominal: Soft. Bowel sounds are normal. She exhibits no distension and no mass. There  is no tenderness. There is no rebound and no guarding.  Genitourinary: Vagina normal. No labial fusion. There is no rash, tenderness, lesion or injury on the right labia. There is no rash, tenderness, lesion or injury on the left labia. Uterus is enlarged. Cervix exhibits no motion tenderness. Right adnexum displays no mass, no tenderness and no fullness. Left adnexum displays no mass, no tenderness and no fullness.  Musculoskeletal:       Right shoulder: Normal.       Left shoulder: Normal.       Cervical back: Normal.  Lymphadenopathy:    She has no cervical adenopathy.  Neurological: She is alert and oriented to person, place, and time. She has normal reflexes. No cranial nerve deficit. She exhibits normal muscle tone. Coordination normal.  Skin: Skin is warm and dry. No rash noted. She is not diaphoretic. No erythema. No pallor.  Psychiatric: She has a normal mood and affect. Her behavior is normal. Judgment and thought content normal.  Nursing note and vitals reviewed.       Assessment & Plan:  Annual physical exam  Encounter for routine gynecological examination - Plan: Pap IG and HPV (high risk) DNA detection  Pure hypercholesterolemia - Plan: CBC with Differential/Platelet, Comprehensive metabolic panel, Lipid panel  Hypothyroidism due to acquired atrophy of thyroid  Glucose intolerance (impaired glucose tolerance) - Plan: CBC with Differential/Platelet, Comprehensive metabolic panel, Hemoglobin A1c, POCT urinalysis dipstick, Microalbumin, urine  Overweight (BMI 25.0-29.9)   1. Complete Physical examination: anticipatory guidance --- exercise, weight loss.  Pap smear obtained; mammogram UTD.  Clarify date of repeat colonoscopy. Immunizations reviewed; pt declined Zostavax and flu vaccines.   2.  Gynecological exam: pap smear obtained; mammogram UTD. 3.  Hypercholesterolemia: controlled;  obtain labs; continue current medications. 4.  Hypothyroidism: stable; followed by ENT; continue current medications per ENT. 5.  Glucose intolerance; stable; recommend dietary modification, weight loss, exercise. Obtain labs. 6.  Overweight: BMI 29; recommend weight loss, exercise, low fat and low caloric food choices.   Meds ordered this encounter  Medications  . DISCONTD: simvastatin (ZOCOR) 20 MG tablet    Sig: Take 1 tablet (20 mg total) by mouth daily. PATIENT NEEDS OFFICE VISIT FOR ADDITIONAL REFILLS    Dispense:  90 tablet    Refill:  1  . simvastatin (ZOCOR) 40 MG tablet    Sig: Take 1 tablet (40 mg total) by mouth daily. PATIENT NEEDS OFFICE VISIT FOR ADDITIONAL REFILLS    Dispense:  90 tablet    Refill:  1   Kristi Elayne Guerin, M.D. [facility] [address] [place], [place]  [address] [phone] phone [phone] fax

## 2014-09-29 LAB — MICROALBUMIN, URINE: MICROALB UR: 0.5 mg/dL (ref <2.0)

## 2014-09-29 LAB — HEMOGLOBIN A1C
HEMOGLOBIN A1C: 6 % — AB (ref <5.7)
Mean Plasma Glucose: 126 mg/dL — ABNORMAL HIGH (ref <117)

## 2014-10-03 MED ORDER — SIMVASTATIN 40 MG PO TABS: 40.0000 mg | ORAL_TABLET | Freq: Every day | ORAL | Status: DC

## 2014-10-04 DIAGNOSIS — E663 Overweight: Secondary | ICD-10-CM | POA: Insufficient documentation

## 2014-10-04 LAB — PAP IG AND HPV HIGH-RISK: HPV DNA HIGH RISK: NOT DETECTED

## 2015-03-29 ENCOUNTER — Ambulatory Visit (INDEPENDENT_AMBULATORY_CARE_PROVIDER_SITE_OTHER): Payer: BLUE CROSS/BLUE SHIELD | Admitting: Family Medicine

## 2015-03-29 ENCOUNTER — Encounter: Payer: Self-pay | Admitting: Family Medicine

## 2015-03-29 ENCOUNTER — Other Ambulatory Visit: Payer: Self-pay | Admitting: Family Medicine

## 2015-03-29 VITALS — BP 130/70 | HR 63 | Temp 98.3°F | Resp 16 | Ht 66.0 in | Wt 188.2 lb

## 2015-03-29 DIAGNOSIS — R238 Other skin changes: Secondary | ICD-10-CM | POA: Diagnosis not present

## 2015-03-29 DIAGNOSIS — E663 Overweight: Secondary | ICD-10-CM | POA: Diagnosis not present

## 2015-03-29 DIAGNOSIS — E034 Atrophy of thyroid (acquired): Secondary | ICD-10-CM | POA: Diagnosis not present

## 2015-03-29 DIAGNOSIS — R7302 Impaired glucose tolerance (oral): Secondary | ICD-10-CM

## 2015-03-29 DIAGNOSIS — E038 Other specified hypothyroidism: Secondary | ICD-10-CM

## 2015-03-29 DIAGNOSIS — E7800 Pure hypercholesterolemia, unspecified: Secondary | ICD-10-CM

## 2015-03-29 DIAGNOSIS — R233 Spontaneous ecchymoses: Secondary | ICD-10-CM

## 2015-03-29 LAB — COMPREHENSIVE METABOLIC PANEL
ALT: 21 U/L (ref 6–29)
AST: 17 U/L (ref 10–35)
Albumin: 4 g/dL (ref 3.6–5.1)
Alkaline Phosphatase: 52 U/L (ref 33–130)
BILIRUBIN TOTAL: 0.5 mg/dL (ref 0.2–1.2)
BUN: 15 mg/dL (ref 7–25)
CALCIUM: 9.5 mg/dL (ref 8.6–10.4)
CHLORIDE: 104 mmol/L (ref 98–110)
CO2: 26 mmol/L (ref 20–31)
CREATININE: 0.81 mg/dL (ref 0.50–0.99)
Glucose, Bld: 92 mg/dL (ref 65–99)
Potassium: 4 mmol/L (ref 3.5–5.3)
Sodium: 140 mmol/L (ref 135–146)
Total Protein: 6.7 g/dL (ref 6.1–8.1)

## 2015-03-29 LAB — CBC WITH DIFFERENTIAL/PLATELET
BASOS ABS: 0 10*3/uL (ref 0.0–0.1)
BASOS PCT: 0 % (ref 0–1)
EOS ABS: 0.1 10*3/uL (ref 0.0–0.7)
Eosinophils Relative: 1 % (ref 0–5)
HCT: 40 % (ref 36.0–46.0)
HEMOGLOBIN: 13.9 g/dL (ref 12.0–15.0)
LYMPHS ABS: 2.5 10*3/uL (ref 0.7–4.0)
Lymphocytes Relative: 35 % (ref 12–46)
MCH: 28.5 pg (ref 26.0–34.0)
MCHC: 34.8 g/dL (ref 30.0–36.0)
MCV: 82.1 fL (ref 78.0–100.0)
MPV: 11.6 fL (ref 8.6–12.4)
Monocytes Absolute: 0.4 10*3/uL (ref 0.1–1.0)
Monocytes Relative: 6 % (ref 3–12)
Neutro Abs: 4.2 10*3/uL (ref 1.7–7.7)
Neutrophils Relative %: 58 % (ref 43–77)
PLATELETS: 243 10*3/uL (ref 150–400)
RBC: 4.87 MIL/uL (ref 3.87–5.11)
RDW: 15.4 % (ref 11.5–15.5)
WBC: 7.2 10*3/uL (ref 4.0–10.5)

## 2015-03-29 LAB — LIPID PANEL
CHOLESTEROL: 182 mg/dL (ref 125–200)
HDL: 55 mg/dL (ref >=46)
LDL Cholesterol: 101 mg/dL (ref <130)
TRIGLYCERIDES: 129 mg/dL (ref <150)
Total CHOL/HDL Ratio: 3.3 Ratio (ref <=5.0)
VLDL: 26 mg/dL (ref <30)

## 2015-03-29 MED ORDER — SIMVASTATIN 40 MG PO TABS
40.0000 mg | ORAL_TABLET | Freq: Every day | ORAL | Status: DC
Start: 2015-03-29 — End: 2015-11-28

## 2015-03-29 NOTE — Patient Instructions (Signed)

## 2015-03-29 NOTE — Progress Notes (Signed)
Subjective:    Patient ID: Stacey Cline, female    DOB: 1955-08-24, 58 y.o.   MRN: [id number]  03/29/2015  Follow-up; Pure Hypercholesteremia; and Glucose intolerance   HPI This 59 y.o. female presents for six month follow-up;  1. Hypercholesterolemia:  Patient reports good compliance with medication, good tolerance to medication, and good symptom control.  Increased Simvastatin 40mg  daily.  Non-complaint with diet.  Exercising daily except for past two months.  Has been vacationing a lot in July.    2.  Glucose intolerance:  weight down one pound.    3.  Hypothyroidism: Patient reports good compliance with medication, good tolerance to medication, and good symptom control.  Jiengel.  Sees him soon.    4. Bruising easily:  Onset two years ago.  RLE bruise present for six months.  No gum bleeding; no epistaxis.  No hematuria; no bloody stools.  No other sites of bruising.  No ASA daily.    Did not go see gynecology; no dryness.  Feels like getting old.     Review of Systems  Constitutional: Negative for fever, chills, diaphoresis and fatigue.  Eyes: Negative for visual disturbance.  Respiratory: Negative for cough and shortness of breath.   Cardiovascular: Negative for chest pain, palpitations and leg swelling.  Gastrointestinal: Negative for nausea, vomiting, abdominal pain, diarrhea and constipation.  Endocrine: Negative for cold intolerance, heat intolerance, polydipsia, polyphagia and polyuria.  Genitourinary: Positive for dyspareunia.  Neurological: Negative for dizziness, tremors, seizures, syncope, facial asymmetry, speech difficulty, weakness, light-headedness, numbness and headaches.  Hematological: Bruises/bleeds easily.    Past Medical History  Diagnosis Date  . Leiomyoma of uterus, unspecified   . Pain in joint, multiple sites   . Candidiasis of vulva and vagina   . Pure hypercholesterolemia   . Postsurgical hypothyroidism   . Unspecified vitamin D  deficiency   . Papanicolaou smear of cervix with atypical squamous cells of undetermined significance (ASC-US)   . Pain in joint, shoulder region   . Ventral hernia, unspecified, without mention of obstruction or gangrene   . Menopause age 33   Past Surgical History  Procedure Laterality Date  . Tonsillectomy and adenoidectomy  1960's  . Breast biopsy  2006    Right  Benign  . Total thyroidectomy  2008   Allergies  Allergen Reactions  . Fish Allergy   . Penicillins Rash   Current Outpatient Prescriptions  Medication Sig Dispense Refill  . levothyroxine (SYNTHROID, LEVOTHROID) 50 MCG tablet Take 50 mcg by mouth daily.    . simvastatin (ZOCOR) 40 MG tablet Take 1 tablet (40 mg total) by mouth daily at 6 PM. 90 tablet 1  . Calcium Carbonate-Vitamin D (CALCIUM 500 + D) 500-125 MG-UNIT TABS Take by mouth daily.     No current facility-administered medications for this visit.   Social History   Social History  . Marital Status: Married    Spouse Name: N/A  . Number of Children: 0  . Years of Education: college   Occupational History  . [employer]   Social History Main Topics  . Smoking status: Never Smoker   . Smokeless tobacco: Not on file  . Alcohol Use: No  . Drug Use: No  . Sexual Activity: Yes   Other Topics Concern  . Not on file   Social History Narrative   Married x 15 years; Happily, no abuse. Husband is a Theme park manager.       Children: none      Lives:  with husband.      Employment:  Retire 06/2012 from [employer] after 35 years; wants to volunteer.      Tobacco:        Alcohol:   Caffeine use: Moderate amount.       Exercise: gym three days per week.        Family History  Problem Relation Age of Onset  . Breast cancer    . Hypertension    . Diabetes type II    . Heart disease Father     CAD/3  AMI in 40's   . Stroke Father     several  . Lung disease Father   . Hypertension Mother   . Diabetes Mother   . Heart disease Mother 27    AMI several;  first unsure age.  Pacemaker  . Hyperlipidemia Mother   . Kidney failure Mother   . Diabetes Sister   . Cancer Sister 34    Breast cancer  . Hypertension Brother        Objective:    BP 130/70 mmHg  Pulse 63  Temp(Src) 98.3 F (36.8 C) (Oral)  Resp 16  Ht 5\' 6"  (1.676 m)  Wt 188 lb 3.2 oz (85.367 kg)  BMI 30.39 kg/m2  SpO2 100% Physical Exam  Constitutional: She is oriented to person, place, and time. She appears well-developed and well-nourished. No distress.  HENT:  Head: Normocephalic and atraumatic.  Right Ear: External ear normal.  Left Ear: External ear normal.  Nose: Nose normal.  Mouth/Throat: Oropharynx is clear and moist.  Eyes: Conjunctivae and EOM are normal. Pupils are equal, round, and reactive to light.  Neck: Normal range of motion. Neck supple. Carotid bruit is not present. No thyromegaly present.  Cardiovascular: Normal rate, regular rhythm, normal heart sounds and intact distal pulses.  Exam reveals no gallop and no friction rub.   No murmur heard. Pulmonary/Chest: Effort normal and breath sounds normal. She has no wheezes. She has no rales.  Abdominal: Soft. Bowel sounds are normal. She exhibits no distension and no mass. There is no tenderness. There is no rebound and no guarding.  Lymphadenopathy:    She has no cervical adenopathy.  Neurological: She is alert and oriented to person, place, and time. No cranial nerve deficit.  Skin: Skin is warm and dry. No rash noted. She is not diaphoretic. No erythema. No pallor.  No ecchymoses or petechiae; discoloration/hyperpigmentation of LLE at anterior lower leg.  Psychiatric: She has a normal mood and affect. Her behavior is normal.        Assessment & Plan:   1. Pure hypercholesterolemia   2. Overweight (BMI 25.0-29.9)   3. Glucose intolerance (impaired glucose tolerance)   4. Hypothyroidism due to acquired atrophy of thyroid     1.  Hypercholesterolemia: controlled; obtain labs; refills  provided. 2.  Overweight; recommend weight loss, exercise, and low-caloric food choices. 3.  Glucose intolerance; stable; obtain labs; non-compliant with low sugar food choices. 4.  Hypothyroidism: managed by Jiengel/ENT; obtain labs for upcoming appointment. 5. Easy bruising: New; no associated epistaxis, hematuria, bloody stools, vaginal bleeding.  Minimal bruising in office today; obtain CBC and continue to observe.     Orders Placed This Encounter  Procedures  . CBC with Differential/Platelet  . Comprehensive metabolic panel    Order Specific Question:  Has the patient fasted?    Answer:  Yes  . Hemoglobin A1c  . Lipid panel    Order Specific Question:  Has the  patient fasted?    Answer:  Yes  . T4, free  . TSH    Meds ordered this encounter  Medications  . simvastatin (ZOCOR) 40 MG tablet    Sig: Take 1 tablet (40 mg total) by mouth daily at 6 PM.    Dispense:  90 tablet    Refill:  1    Return in about 6 months (around 09/29/2015) for complete physical examiniation.   Dellas Guard Elayne Guerin, M.D. [facility] [address] [place], [place]  [address] [phone] phone [phone] fax

## 2015-03-30 LAB — HEMOGLOBIN A1C
Hgb A1c MFr Bld: 6.2 % — ABNORMAL HIGH (ref <5.7)
MEAN PLASMA GLUCOSE: 131 mg/dL — AB (ref <117)

## 2015-04-04 ENCOUNTER — Telehealth: Payer: Self-pay

## 2015-04-04 LAB — TSH: TSH: 0.636 u[IU]/mL (ref 0.350–4.500)

## 2015-04-04 LAB — T4, FREE: Free T4: 1.24 ng/dL (ref 0.80–1.80)

## 2015-04-04 NOTE — Telephone Encounter (Signed)
Pt states she had a CPE with Dr Tamala Julian and is still waiting to hear about her labs and also we were suppose to send a copy of the report to her ENT in [place], her appt is this afternoon and she want to make sure That has been taken care of. Please call [phone]

## 2015-04-04 NOTE — Telephone Encounter (Signed)
Pt is actually on her way to Endo, not ENT. She was needing her thyroid tests for that. She is going to cancel her appt and reschedule and then call us back so we can fax labs.

## 2015-04-04 NOTE — Telephone Encounter (Signed)
LMOM to CB regarding labs and to get the number for ENT

## 2015-04-04 NOTE — Telephone Encounter (Signed)
See labs. They were reviewed today. MR can you please send to her ENT. Thanks

## 2015-04-07 ENCOUNTER — Encounter: Payer: Self-pay | Admitting: Family Medicine

## 2015-09-29 ENCOUNTER — Encounter: Payer: BLUE CROSS/BLUE SHIELD | Admitting: Family Medicine

## 2015-10-10 ENCOUNTER — Encounter: Payer: BLUE CROSS/BLUE SHIELD | Admitting: Family Medicine

## 2015-11-21 ENCOUNTER — Encounter: Payer: BLUE CROSS/BLUE SHIELD | Admitting: Family Medicine

## 2015-11-28 ENCOUNTER — Ambulatory Visit (INDEPENDENT_AMBULATORY_CARE_PROVIDER_SITE_OTHER): Payer: BLUE CROSS/BLUE SHIELD | Admitting: Family Medicine

## 2015-11-28 ENCOUNTER — Encounter: Payer: Self-pay | Admitting: Family Medicine

## 2015-11-28 VITALS — BP 102/66 | HR 78 | Temp 98.0°F | Resp 16 | Ht 66.0 in | Wt 188.0 lb

## 2015-11-28 DIAGNOSIS — K2970 Gastritis, unspecified, without bleeding: Secondary | ICD-10-CM

## 2015-11-28 DIAGNOSIS — E038 Other specified hypothyroidism: Secondary | ICD-10-CM

## 2015-11-28 DIAGNOSIS — R0602 Shortness of breath: Secondary | ICD-10-CM

## 2015-11-28 DIAGNOSIS — E663 Overweight: Secondary | ICD-10-CM | POA: Diagnosis not present

## 2015-11-28 DIAGNOSIS — Z0000 Encounter for general adult medical examination without abnormal findings: Secondary | ICD-10-CM

## 2015-11-28 DIAGNOSIS — R7302 Impaired glucose tolerance (oral): Secondary | ICD-10-CM

## 2015-11-28 DIAGNOSIS — E034 Atrophy of thyroid (acquired): Secondary | ICD-10-CM | POA: Diagnosis not present

## 2015-11-28 DIAGNOSIS — E7800 Pure hypercholesterolemia, unspecified: Secondary | ICD-10-CM

## 2015-11-28 LAB — CBC WITH DIFFERENTIAL/PLATELET
Basophils Absolute: 55 cells/uL (ref 0–200)
Basophils Relative: 1 %
EOS PCT: 1 %
Eosinophils Absolute: 55 cells/uL (ref 15–500)
HCT: 42.1 % (ref 35.0–45.0)
Hemoglobin: 14.1 g/dL (ref 11.7–15.5)
LYMPHS PCT: 40 %
Lymphs Abs: 2200 cells/uL (ref 850–3900)
MCH: 28.4 pg (ref 27.0–33.0)
MCHC: 33.5 g/dL (ref 32.0–36.0)
MCV: 84.7 fL (ref 80.0–100.0)
MONOS PCT: 9 %
MPV: 11.7 fL (ref 7.5–12.5)
Monocytes Absolute: 495 cells/uL (ref 200–950)
NEUTROS PCT: 49 %
Neutro Abs: 2695 cells/uL (ref 1500–7800)
Platelets: 204 10*3/uL (ref 140–400)
RBC: 4.97 MIL/uL (ref 3.80–5.10)
RDW: 15.1 % — ABNORMAL HIGH (ref 11.0–15.0)
WBC: 5.5 10*3/uL (ref 3.8–10.8)

## 2015-11-28 LAB — COMPREHENSIVE METABOLIC PANEL
ALBUMIN: 4.2 g/dL (ref 3.6–5.1)
ALK PHOS: 50 U/L (ref 33–130)
ALT: 21 U/L (ref 6–29)
AST: 18 U/L (ref 10–35)
BUN: 16 mg/dL (ref 7–25)
CHLORIDE: 108 mmol/L (ref 98–110)
CO2: 27 mmol/L (ref 20–31)
CREATININE: 0.99 mg/dL (ref 0.50–0.99)
Calcium: 9.6 mg/dL (ref 8.6–10.4)
Glucose, Bld: 105 mg/dL — ABNORMAL HIGH (ref 65–99)
POTASSIUM: 4.4 mmol/L (ref 3.5–5.3)
SODIUM: 141 mmol/L (ref 135–146)
TOTAL PROTEIN: 6.9 g/dL (ref 6.1–8.1)
Total Bilirubin: 0.5 mg/dL (ref 0.2–1.2)

## 2015-11-28 LAB — HEMOGLOBIN A1C
HEMOGLOBIN A1C: 5.9 % — AB (ref <5.7)
MEAN PLASMA GLUCOSE: 123 mg/dL

## 2015-11-28 LAB — LIPID PANEL
Cholesterol: 183 mg/dL (ref 125–200)
HDL: 49 mg/dL (ref >=46)
LDL CALC: 107 mg/dL (ref <130)
Total CHOL/HDL Ratio: 3.7 Ratio (ref <=5.0)
Triglycerides: 134 mg/dL (ref <150)
VLDL: 27 mg/dL (ref <30)

## 2015-11-28 LAB — TSH: TSH: 0.73 mIU/L

## 2015-11-28 LAB — T4, FREE: Free T4: 1.6 ng/dL (ref 0.8–1.8)

## 2015-11-28 MED ORDER — SIMVASTATIN 40 MG PO TABS: 40.0000 mg | ORAL_TABLET | Freq: Every day | ORAL | Status: DC

## 2015-11-28 MED ORDER — RANITIDINE HCL 150 MG PO TABS: 150.0000 mg | ORAL_TABLET | Freq: Two times a day (BID) | ORAL | Status: DC

## 2015-11-28 NOTE — Patient Instructions (Addendum)
   IF you received an x-ray today, you will receive an invoice from [facility]. Please contact [facility] at [phone] with questions or concerns regarding your invoice.   IF you received labwork today, you will receive an invoice from [facility]. Please contact [facility] at [phone] with questions or concerns regarding your invoice.   Our billing staff will not be able to assist you with questions regarding bills from these companies.  You will be contacted with the lab results as soon as they are available. The fastest way to get your results is to activate your My Chart account. Instructions are located on the last page of this paperwork. If you have not heard from us regarding the results in 2 weeks, please contact this office.    Keeping You Healthy  Get These Tests  Blood Pressure- Have your blood pressure checked by your healthcare provider at least once a year.  Normal blood pressure is 120/80.  Weight- Have your body mass index (BMI) calculated to screen for obesity.  BMI is a measure of body fat based on height and weight.  You can calculate your own BMI at www.nhlbisupport.com/bmi/  Cholesterol- Have your cholesterol checked every year.  Diabetes- Have your blood sugar checked every year if you have high blood pressure, high cholesterol, a family history of diabetes or if you are overweight.  Pap Test - Have a pap test every 1 to 5 years if you have been sexually active.  If you are older than 65 and recent pap tests have been normal you may not need additional pap tests.  In addition, if you have had a hysterectomy  for benign disease additional pap tests are not necessary.  Mammogram-Yearly mammograms are essential for early detection of breast cancer  Screening for Colon Cancer- Colonoscopy starting at age 50. Screening may begin sooner depending on your family history and other health conditions.  Follow up colonoscopy  as directed by your Gastroenterologist.  Screening for Osteoporosis- Screening begins at age 65 with bone density scanning, sooner if you are at higher risk for developing Osteoporosis.  Get these medicines  Calcium with Vitamin D- Your body requires 1200-1500 mg of Calcium a day and 800-1000 IU of Vitamin D a day.  You can only absorb 500 mg of Calcium at a time therefore Calcium must be taken in 2 or 3 separate doses throughout the day.  Hormones- Hormone therapy has been associated with increased risk for certain cancers and heart disease.  Talk to your healthcare provider about if you need relief from menopausal symptoms.  Aspirin- Ask your healthcare provider about taking Aspirin to prevent Heart Disease and Stroke.  Get these Immuniztions  Flu shot- Every fall  Pneumonia shot- Once after the age of 65; if you are younger ask your healthcare provider if you need a pneumonia shot.  Tetanus- Every ten years.  Zostavax- Once after the age of 60 to prevent shingles.  Take these steps  Don't smoke- Your healthcare provider can help you quit. For tips on how to quit, ask your healthcare provider or go to www.smokefree.gov or call 1-800 QUIT-NOW.  Be physically active- Exercise 5 days a week for a minimum of 30 minutes.  If you are not already physically active, start slow and gradually work up to 30 minutes of moderate physical activity.  Try walking, dancing, bike riding, swimming, etc.  Eat a healthy diet- Eat a variety of healthy foods such as fruits, vegetables, whole   grains, low fat milk, low fat cheeses, yogurt, lean meats, chicken, fish, eggs, dried beans, tofu, etc.  For more information go to www.thenutritionsource.org  Dental visit- Brush and floss teeth twice daily; visit your dentist twice a year.  Eye exam- Visit your Optometrist or Ophthalmologist yearly.  Drink alcohol in moderation- Limit alcohol intake to one drink or less a day.  Never drink and  drive.  Depression- Your emotional health is as important as your physical health.  If you're feeling down or losing interest in things you normally enjoy, please talk to your healthcare provider.  Seat Belts- can save your life; always wear one  Smoke/Carbon Monoxide detectors- These detectors need to be installed on the appropriate level of your home.  Replace batteries at least once a year.  Violence- If anyone is threatening or hurting you, please tell your healthcare provider.  Living Will/ Health care power of attorney- Discuss with your healthcare provider and family. 

## 2015-11-28 NOTE — Progress Notes (Signed)
Subjective:    Patient ID: Stacey Cline, female    DOB: 1956-07-13, 60 y.o.   MRN: [id number]  11/28/2015  Annual Exam   HPI This 59 y.o. female presents for Complete Physical Examination.  Last physical:  09-28-14 Pap smear:  09-28-14  WNL HPV negative Mammogram:  07-01-2014 Colonoscopy:  08-19-2006 Bone density:  none TDAP:  2008 Pneumovax:   never Zostavax: never Influenza:  refuses Eye exam: 2016 Dental exam: every six months.   SOB: can be sitting and talking and cannot breath; onset in past three weeks.  Has always suffered with DOE.  S/p cardiolite  and CXR.  If talking with someone, having SOB.  Realizes that not normal.  Still working out; can walk one mile with no problem.  After getting off of treadmill, goes outside and cannot breath.  Has been elevating treadmill with incline; no problems.  A lot going on right now; might be stress.  Has something sitting in epigastric region; when this started, food was stopping in epigastric region; has decreased food intake.  Chronic issue for years.  Feels like epigastric fullness and SOB are related; has changed diet and symptoms are improved.  Now returning to normal; can get out of bed with malaise/fatigue/irritation.  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  Was walking across room to get breath.  No sinus congestion.  Coughing a little bit when needing to get breath.  Would need to cough with taking deep breath. No orthopnea.  No leg swelling; no calf pain.  No chest pains. S/p abdominal US in 2014 WNL.    Review of Systems  Constitutional: Negative for fever, chills, diaphoresis, activity change, appetite change, fatigue and unexpected weight change.  HENT: Negative for congestion, dental problem, drooling, ear discharge, ear pain, facial swelling, hearing loss, mouth sores, nosebleeds, postnasal drip, rhinorrhea, sinus pressure, sneezing, sore throat, tinnitus,  trouble swallowing and voice change.   Eyes: Negative for photophobia, pain, discharge, redness, itching and visual disturbance.  Respiratory: Negative for apnea, cough, choking, chest tightness, shortness of breath, wheezing and stridor.   Cardiovascular: Negative for chest pain, palpitations and leg swelling.  Gastrointestinal: Negative for nausea, vomiting, abdominal pain, diarrhea, constipation, blood in stool, abdominal distention, anal bleeding and rectal pain.  Endocrine: Negative for cold intolerance, heat intolerance, polydipsia, polyphagia and polyuria.  Genitourinary: Negative for dysuria, urgency, frequency, hematuria, flank pain, decreased urine volume, vaginal bleeding, vaginal discharge, enuresis, difficulty urinating, genital sores, vaginal pain, menstrual problem, pelvic pain and dyspareunia.       Nocturia x 0.  No urinary incontinence.  Musculoskeletal: Negative for myalgias, back pain, joint swelling, arthralgias, gait problem, neck pain and neck stiffness.  Skin: Negative for color change, pallor, rash and wound.  Allergic/Immunologic: Negative for environmental allergies, food allergies and immunocompromised state.  Neurological: Negative for dizziness, tremors, seizures, syncope, facial asymmetry, speech difficulty, weakness, light-headedness, numbness and headaches.  Hematological: Negative for adenopathy. Does not bruise/bleed easily.  Psychiatric/Behavioral: Negative for suicidal ideas, hallucinations, behavioral problems, confusion, sleep disturbance, self-injury, dysphoric mood, decreased concentration and agitation. The patient is not nervous/anxious and is not hyperactive.     Past Medical History  Diagnosis Date  . Leiomyoma of uterus, unspecified   . Pain in joint, multiple sites   . Candidiasis of vulva and vagina   . Pure hypercholesterolemia   . Postsurgical hypothyroidism   . Unspecified vitamin D deficiency   . Papanicolaou  smear of cervix with atypical  squamous cells of undetermined significance (ASC-US)   . Pain in joint, shoulder region   . Ventral hernia, unspecified, without mention of obstruction or gangrene   . Menopause age 75   Past Surgical History  Procedure Laterality Date  . Tonsillectomy and adenoidectomy  1960's  . Breast biopsy  2006    Right  Benign  . Total thyroidectomy  2008  . Cosmetic surgery      feet   Allergies  Allergen Reactions  . Fish Allergy   . Penicillins Rash   Current Outpatient Prescriptions  Medication Sig Dispense Refill  . levothyroxine (SYNTHROID, LEVOTHROID) 50 MCG tablet Take 50 mcg by mouth daily.    . simvastatin (ZOCOR) 40 MG tablet Take 1 tablet (40 mg total) by mouth daily at 6 PM. 90 tablet 1  . Calcium Carbonate-Vitamin D (CALCIUM 500 + D) 500-125 MG-UNIT TABS Take by mouth daily. Reported on 11/28/2015    . ranitidine (ZANTAC) 150 MG tablet Take 1 tablet (150 mg total) by mouth 2 (two) times daily. 60 tablet 0   No current facility-administered medications for this visit.   Social History   Social History  . Marital Status: Married    Spouse Name: N/A  . Number of Children: 0  . Years of Education: college   Occupational History  . [employer]   Social History Main Topics  . Smoking status: Never Smoker   . Smokeless tobacco: Not on file  . Alcohol Use: No  . Drug Use: No  . Sexual Activity: Yes   Other Topics Concern  . Not on file   Social History Narrative   Married x 15 years; Happily, no abuse. Husband is a Theme park manager.       Children: none      Lives: with husband.      Employment:  Retire 06/2012 from [employer] after 35 years; wants to volunteer.      Tobacco:        Alcohol:   Caffeine use: Moderate amount.       Exercise: gym three days per week.        Family History  Problem Relation Age of Onset  . Breast cancer    . Hypertension    . Diabetes type II    . Heart disease Father     CAD/3  AMI in 52's   . Stroke Father     several  . Lung  disease Father   . Hypertension Mother   . Diabetes Mother   . Heart disease Mother 42    AMI several; first unsure age.  Pacemaker  . Hyperlipidemia Mother   . Kidney failure Mother   . Diabetes Sister   . Cancer Sister 14    Breast cancer  . Hypertension Brother        Objective:    BP 102/66 mmHg  Pulse 78  Temp(Src) 98 F (36.7 C) (Oral)  Resp 16  Ht '5\' 6"'$  (1.676 m)  Wt 188 lb (85.276 kg)  BMI 30.36 kg/m2  SpO2 97% Physical Exam  Constitutional: She is oriented to person, place, and time. She appears well-developed and well-nourished. No distress.  HENT:  Head: Normocephalic and atraumatic.  Right Ear: External ear normal.  Left Ear: External ear normal.  Nose: Nose normal.  Mouth/Throat: Oropharynx is clear and moist.  Eyes: Conjunctivae and EOM are normal. Pupils are equal, round, and reactive to light.  Neck: Normal range of  motion and full passive range of motion without pain. Neck supple. No JVD present. Carotid bruit is not present. No thyromegaly present.  Cardiovascular: Normal rate, regular rhythm and normal heart sounds.  Exam reveals no gallop and no friction rub.   No murmur heard. Pulmonary/Chest: Effort normal and breath sounds normal. She has no wheezes. She has no rales.  Abdominal: Soft. Bowel sounds are normal. She exhibits no distension and no mass. There is no tenderness. There is no rebound and no guarding.  Musculoskeletal:       Right shoulder: Normal.       Left shoulder: Normal.       Cervical back: Normal.  Lymphadenopathy:    She has no cervical adenopathy.  Neurological: She is alert and oriented to person, place, and time. She has normal reflexes. No cranial nerve deficit. She exhibits normal muscle tone. Coordination normal.  Skin: Skin is warm and dry. No rash noted. She is not diaphoretic. No erythema. No pallor.  Psychiatric: She has a normal mood and affect. Her behavior is normal. Judgment and thought content normal.  Nursing note  and vitals reviewed.  Results for orders placed or performed in visit on 03/29/15  CBC with Differential/Platelet  Result Value Ref Range   WBC 7.2 4.0 - 10.5 K/uL   RBC 4.87 3.87 - 5.11 MIL/uL   Hemoglobin 13.9 12.0 - 15.0 g/dL   HCT 40.0 36.0 - 46.0 %   MCV 82.1 78.0 - 100.0 fL   MCH 28.5 26.0 - 34.0 pg   MCHC 34.8 30.0 - 36.0 g/dL   RDW 15.4 11.5 - 15.5 %   Platelets 243 150 - 400 K/uL   MPV 11.6 8.6 - 12.4 fL   Neutrophils Relative % 58 43 - 77 %   Neutro Abs 4.2 1.7 - 7.7 K/uL   Lymphocytes Relative 35 12 - 46 %   Lymphs Abs 2.5 0.7 - 4.0 K/uL   Monocytes Relative 6 3 - 12 %   Monocytes Absolute 0.4 0.1 - 1.0 K/uL   Eosinophils Relative 1 0 - 5 %   Eosinophils Absolute 0.1 0.0 - 0.7 K/uL   Basophils Relative 0 0 - 1 %   Basophils Absolute 0.0 0.0 - 0.1 K/uL   Smear Review Criteria for review not met   Comprehensive metabolic panel  Result Value Ref Range   Sodium 140 135 - 146 mmol/L   Potassium 4.0 3.5 - 5.3 mmol/L   Chloride 104 98 - 110 mmol/L   CO2 26 20 - 31 mmol/L   Glucose, Bld 92 65 - 99 mg/dL   BUN 15 7 - 25 mg/dL   Creat 0.81 0.50 - 0.99 mg/dL   Total Bilirubin 0.5 0.2 - 1.2 mg/dL   Alkaline Phosphatase 52 33 - 130 U/L   AST 17 10 - 35 U/L   ALT 21 6 - 29 U/L   Total Protein 6.7 6.1 - 8.1 g/dL   Albumin 4.0 3.6 - 5.1 g/dL   Calcium 9.5 8.6 - 10.4 mg/dL  Hemoglobin A1c  Result Value Ref Range   Hgb A1c MFr Bld 6.2 (H) <5.7 %   Mean Plasma Glucose 131 (H) <117 mg/dL  Lipid panel  Result Value Ref Range   Cholesterol 182 125 - 200 mg/dL   Triglycerides 129 <150 mg/dL   HDL 55 >=46 mg/dL   Total CHOL/HDL Ratio 3.3 <=5.0 Ratio   VLDL 26 <30 mg/dL   LDL Cholesterol 101 <130 mg/dL  T4, free  Result Value Ref Range   Free T4 1.24 0.80 - 1.80 ng/dL  TSH  Result Value Ref Range   TSH 0.636 0.350 - 4.500 uIU/mL       Assessment & Plan:   1. Routine physical examination   2. Pure hypercholesterolemia   3. Hypothyroidism due to acquired atrophy of  thyroid   4. Glucose intolerance (impaired glucose tolerance)   5. Overweight (BMI 25.0-29.9)   6. Shortness of breath   7. Gastritis     Orders Placed This Encounter  Procedures  . CBC with Differential/Platelet  . Comprehensive metabolic panel    Order Specific Question:  Has the patient fasted?    Answer:  Yes  . Hemoglobin A1c  . Lipid panel    Order Specific Question:  Has the patient fasted?    Answer:  Yes  . TSH  . T4, free  . EKG 12-Lead   Meds ordered this encounter  Medications  . ranitidine (ZANTAC) 150 MG tablet    Sig: Take 1 tablet (150 mg total) by mouth 2 (two) times daily.    Dispense:  60 tablet    Refill:  0  . simvastatin (ZOCOR) 40 MG tablet    Sig: Take 1 tablet (40 mg total) by mouth daily at 6 PM.    Dispense:  90 tablet    Refill:  1    Return in about 6 months (around 05/29/2016) for recheck high cholesterol.    Stacey Cline Elayne Guerin, M.D. [facility] [address] [place], [place]  [address] [phone] phone [phone] fax

## 2015-12-01 ENCOUNTER — Encounter: Payer: Self-pay | Admitting: Family Medicine

## 2015-12-31 ENCOUNTER — Other Ambulatory Visit: Payer: Self-pay | Admitting: Family Medicine

## 2016-02-07 ENCOUNTER — Ambulatory Visit (INDEPENDENT_AMBULATORY_CARE_PROVIDER_SITE_OTHER): Payer: BLUE CROSS/BLUE SHIELD | Admitting: Family Medicine

## 2016-02-07 ENCOUNTER — Ambulatory Visit (INDEPENDENT_AMBULATORY_CARE_PROVIDER_SITE_OTHER): Payer: BLUE CROSS/BLUE SHIELD

## 2016-02-07 VITALS — BP 132/80 | HR 68 | Temp 98.2°F | Resp 18 | Ht 66.0 in | Wt 192.0 lb

## 2016-02-07 DIAGNOSIS — R05 Cough: Secondary | ICD-10-CM

## 2016-02-07 DIAGNOSIS — R0600 Dyspnea, unspecified: Secondary | ICD-10-CM

## 2016-02-07 DIAGNOSIS — R0602 Shortness of breath: Secondary | ICD-10-CM | POA: Diagnosis not present

## 2016-02-07 DIAGNOSIS — Z8249 Family history of ischemic heart disease and other diseases of the circulatory system: Secondary | ICD-10-CM

## 2016-02-07 DIAGNOSIS — I493 Ventricular premature depolarization: Secondary | ICD-10-CM

## 2016-02-07 DIAGNOSIS — E669 Obesity, unspecified: Secondary | ICD-10-CM

## 2016-02-07 DIAGNOSIS — R059 Cough, unspecified: Secondary | ICD-10-CM

## 2016-02-07 LAB — POCT CBC
GRANULOCYTE PERCENT: 50.3 % (ref 37–80)
HEMATOCRIT: 37.9 % (ref 37.7–47.9)
Hemoglobin: 13.5 g/dL (ref 12.2–16.2)
Lymph, poc: 2.7 (ref 0.6–3.4)
MCH: 29.5 pg (ref 27–31.2)
MCHC: 35.7 g/dL — AB (ref 31.8–35.4)
MCV: 82.7 fL (ref 80–97)
MID (cbc): 0.4 (ref 0–0.9)
MPV: 8.8 fL (ref 0–99.8)
POC Granulocyte: 3.2 (ref 2–6.9)
POC LYMPH %: 42.8 % (ref 10–50)
POC MID %: 6.9 %M (ref 0–12)
Platelet Count, POC: 175 10*3/uL (ref 142–424)
RBC: 4.58 M/uL (ref 4.04–5.48)
RDW, POC: 14.8 %
WBC: 6.3 10*3/uL (ref 4.6–10.2)

## 2016-02-07 LAB — COMPREHENSIVE METABOLIC PANEL
ALT: 25 U/L (ref 6–29)
AST: 19 U/L (ref 10–35)
Albumin: 4 g/dL (ref 3.6–5.1)
Alkaline Phosphatase: 46 U/L (ref 33–130)
BILIRUBIN TOTAL: 0.5 mg/dL (ref 0.2–1.2)
BUN: 16 mg/dL (ref 7–25)
CO2: 27 mmol/L (ref 20–31)
CREATININE: 1.09 mg/dL — AB (ref 0.50–0.99)
Calcium: 9.1 mg/dL (ref 8.6–10.4)
Chloride: 105 mmol/L (ref 98–110)
GLUCOSE: 95 mg/dL (ref 65–99)
Potassium: 4.4 mmol/L (ref 3.5–5.3)
SODIUM: 142 mmol/L (ref 135–146)
Total Protein: 6.6 g/dL (ref 6.1–8.1)

## 2016-02-07 LAB — D-DIMER, QUANTITATIVE: D-Dimer, Quant: 0.26 mcg/mL FEU (ref <0.50)

## 2016-02-07 LAB — POCT SEDIMENTATION RATE: POCT SED RATE: 11 mm/hr (ref 0–22)

## 2016-02-07 NOTE — Patient Instructions (Addendum)
IF you received an x-ray today, you will receive an invoice from [facility]. Please contact [facility] at [phone] with questions or concerns regarding your invoice.   IF you received labwork today, you will receive an invoice from [employer]. Please contact [facility] at [phone] with questions or concerns regarding your invoice.   Our billing staff will not be able to assist you with questions regarding bills from these companies.  You will be contacted with the lab results as soon as they are available. The fastest way to get your results is to activate your My Chart account. Instructions are located on the last page of this paperwork. If you have not heard from [place] regarding the results in 2 weeks, please contact this office.     We recommend that you schedule a mammogram for breast cancer screening. Typically, you do not need a referral to do this. Please contact a local imaging center to schedule your mammogram.  [facility] - [phone]  *ask for the Radiology Department [facility] - [phone] or [phone]  [facility] - [phone] [place] [phone] [facility] - [phone]  *ask for the [facility] - [phone]  *ask for the Radiology Department [facility] - [phone]  *ask for the [place] - [phone]  Shortness of Breath Shortness of breath means you have trouble breathing. It could also mean that you have a medical problem. You should get immediate medical care for shortness of breath. CAUSES   Not enough oxygen in the air such as with high altitudes or a smoke-filled room.  Certain lung diseases, infections, or problems.  Heart disease or conditions, such as angina or heart failure.  Low red blood cells (anemia).  Poor  physical fitness, which can cause shortness of breath when you exercise.  Chest or back injuries or stiffness.  Being overweight.  Smoking.  Anxiety, which can make you feel like you are not getting enough air. DIAGNOSIS  Serious medical problems can often be found during your physical exam. Tests may also be done to determine why you are having shortness of breath. Tests may include:  Chest X-rays.  Lung function tests.  Blood tests.  An electrocardiogram (ECG).  An ambulatory electrocardiogram. An ambulatory ECG records your heartbeat patterns over a 24-hour period.  Exercise testing.  A transthoracic echocardiogram (TTE). During echocardiography, sound waves are used to evaluate how blood flows through your heart.  A transesophageal echocardiogram (TEE).  Imaging scans. Your health care provider may not be able to find a cause for your shortness of breath after your exam. In this case, it is important to have a follow-up exam with your health care provider as directed.  TREATMENT  Treatment for shortness of breath depends on the cause of your symptoms and can vary greatly. HOME CARE INSTRUCTIONS   Do not smoke. Smoking is a common cause of shortness of breath. If you smoke, ask for help to quit.  Avoid being around chemicals or things that may bother your breathing, such as paint fumes and dust.  Rest as needed. Slowly resume your usual activities.  If medicines were prescribed, take them as directed for the full length of time directed. This includes oxygen and any inhaled medicines.  Keep all follow-up appointments as directed by your health care provider. SEEK MEDICAL CARE IF:   Your  condition does not improve in the time expected.  You have a hard time doing your normal activities even with rest.  You have any new symptoms. SEEK IMMEDIATE MEDICAL CARE IF:   Your shortness of breath gets worse.  You feel light-headed, faint, or develop a cough not  controlled with medicines.  You start coughing up blood.  You have pain with breathing.  You have chest pain or pain in your arms, shoulders, or abdomen.  You have a fever.  You are unable to walk up stairs or exercise the way you normally do. MAKE SURE YOU:  Understand these instructions.  Will watch your condition.  Will get help right away if you are not doing well or get worse.   This information is not intended to replace advice given to you by your health care provider. Make sure you discuss any questions you have with your health care provider.   Document Released: 04/30/2001 Document Revised: 08/10/2013 Document Reviewed: 10/21/2011 Elsevier Interactive Patient Education [employer].

## 2016-02-07 NOTE — Progress Notes (Signed)
Subjective:    Patient ID: Stacey Cline, female    DOB: 04/14/1956, 60 y.o.   MRN: [id number]  02/07/2016  Shortness of Breath   HPI This 60 y.o. female presents for evaluation of shortness of breath. Can be talking and must stop to take breath.  Unable to walk up steps without getting SOB.  Stated in past two months since CPE.  Seems to be progressively worsening.  Went on a mini-vacation; got to the point where was short of breath with walking.  No chest pain.  Has been paying attention; no chest pain.  Intermittent palpitations.  Family with heart disease.  Breathing is worsening. Still can walk on treadmill; has been pushing on treadill without DOE.  Walks on treadmill 3.5 with elevation of 6.  When on an incline, must slow speed down.  Duration of treadmill 15 minutes at that speed.  Then will do elliptical; 2 minutes only on elliptical.  No stationary bike in seven months since walking.  Goes to gym 2 days last week; none this week; prior 3-4 days per week. With summer for 2-3 months, church has been demanding.  No leg swelling.  No orthopnea.  At rest, no problems unless talking.  +coughing started in past two weeks.  Mild wheezing intermittently in past two weeks.  No sneezing, itchy eyes, rhinorrhea, nasal congestion.  Worked in yard [employer] all day; able to do it but had to stop and rest.  Unusual to need to stop and rest.  Really does not think it is heart. No heartburn or indigestion; doing well from GI standpoint; changed eating habits; decreased intake.  Leaving out of town next week; will not return 3 weeks.     S/p Jiengel labs ordered two weeks ago; thyroid panel completely normal.  This occurred three weeks ago.  Horribly fatigued; some days unable to pick up purse.  Discoloration along lower back; duration nine months.  Started applying cream away without improvement.  No worsening.  No symptoms.    Review of Systems  Constitutional: Negative for fever, chills,  diaphoresis and fatigue.  Eyes: Negative for visual disturbance.  Respiratory: Positive for shortness of breath and wheezing. Negative for cough.   Cardiovascular: Negative for chest pain, palpitations and leg swelling.  Gastrointestinal: Negative for nausea, vomiting, abdominal pain, diarrhea and constipation.  Endocrine: Negative for cold intolerance, heat intolerance, polydipsia, polyphagia and polyuria.  Neurological: Negative for dizziness, tremors, seizures, syncope, facial asymmetry, speech difficulty, weakness, light-headedness, numbness and headaches.  Psychiatric/Behavioral: Negative for suicidal ideas, sleep disturbance, self-injury and dysphoric mood. The patient is not nervous/anxious.     Past Medical History  Diagnosis Date  . Leiomyoma of uterus, unspecified   . Pain in joint, multiple sites   . Candidiasis of vulva and vagina   . Pure hypercholesterolemia   . Postsurgical hypothyroidism   . Unspecified vitamin D deficiency   . Papanicolaou smear of cervix with atypical squamous cells of undetermined significance (ASC-US)   . Pain in joint, shoulder region   . Ventral hernia, unspecified, without mention of obstruction or gangrene   . Menopause age 53   Past Surgical History  Procedure Laterality Date  . Tonsillectomy and adenoidectomy  1960's  . Breast biopsy  2006    Right  Benign  . Total thyroidectomy  2008  . Cosmetic surgery      feet   Allergies  Allergen Reactions  . Fish Allergy   . Isovue [Iopamidol] Hives  Pt developed 1 hive on her chest and 2 on her neck area appx 10 minutes post contrast injection. Given 50 mg oral benadryl.  Needs 13 hour pre meds in the future.   Marland Kitchen Penicillins Rash   Current Outpatient Prescriptions  Medication Sig Dispense Refill  . Calcium Carbonate-Vitamin D (CALCIUM 500 + D) 500-125 MG-UNIT TABS Take by mouth daily. Reported on 11/28/2015    . levothyroxine (SYNTHROID, LEVOTHROID) 50 MCG tablet Take 50 mcg by mouth  daily.    . simvastatin (ZOCOR) 40 MG tablet Take 1 tablet (40 mg total) by mouth daily at 6 PM. 90 tablet 1   No current facility-administered medications for this visit.   Social History   Social History  . Marital Status: Married    Spouse Name: N/A  . Number of Children: 0  . Years of Education: college   Occupational History  . [employer]   Social History Main Topics  . Smoking status: Never Smoker   . Smokeless tobacco: Not on file  . Alcohol Use: No  . Drug Use: No  . Sexual Activity: Yes   Other Topics Concern  . Not on file   Social History Narrative   Married x 15 years; Happily, no abuse. Husband is a Theme park manager.       Children: none      Lives: with husband.      Employment:  Retire 06/2012 from [employer] after 35 years; wants to volunteer.      Tobacco:        Alcohol:   Caffeine use: Moderate amount.       Exercise: gym three days per week.        Family History  Problem Relation Age of Onset  . Breast cancer    . Hypertension    . Diabetes type II    . Heart disease Father     CAD/3  AMI in 86's   . Stroke Father     several  . Lung disease Father   . Hypertension Mother   . Diabetes Mother   . Heart disease Mother 20    AMI several; first unsure age.  Pacemaker  . Hyperlipidemia Mother   . Kidney failure Mother   . Diabetes Sister   . Cancer Sister 26    Breast cancer  . Hypertension Brother        Objective:    BP 132/80 mmHg  Pulse 68  Temp(Src) 98.2 F (36.8 C) (Oral)  Resp 18  Ht 5\' 6"  (1.676 m)  Wt 192 lb (87.091 kg)  BMI 31.00 kg/m2  SpO2 98% Physical Exam  Constitutional: She is oriented to person, place, and time. She appears well-developed and well-nourished. No distress.  HENT:  Head: Normocephalic and atraumatic.  Right Ear: External ear normal.  Left Ear: External ear normal.  Nose: Nose normal.  Mouth/Throat: Oropharynx is clear and moist.  Eyes: Conjunctivae and EOM are normal. Pupils are equal, round, and  reactive to light.  Neck: Normal range of motion. Neck supple. Carotid bruit is not present. No thyromegaly present.  Cardiovascular: Normal rate, regular rhythm, normal heart sounds and intact distal pulses.  Exam reveals no gallop and no friction rub.   No murmur heard. Pulmonary/Chest: Effort normal and breath sounds normal. She has no wheezes. She has no rales.  Abdominal: Soft. Bowel sounds are normal. She exhibits no distension and no mass. There is no tenderness. There is no rebound and no guarding.  Lymphadenopathy:  She has no cervical adenopathy.  Neurological: She is alert and oriented to person, place, and time. No cranial nerve deficit.  Skin: Skin is warm and dry. No rash noted. She is not diaphoretic. No erythema. No pallor.  Psychiatric: She has a normal mood and affect. Her behavior is normal.   EKG: NSR; frequent PVCs     Assessment & Plan:   1. Shortness of breath   2. Cough   3. Dyspnea   4. Obesity   5. PVC (premature ventricular contraction)   6. Family history of cardiovascular disease    -worsening/progressing -refer for CT chest to rule out PE. -refer to pulmonology and cardiology -to ED for acute decline.   Orders Placed This Encounter  Procedures  . DG Chest 2 View    Standing Status: Future     Number of Occurrences: 1     Standing Expiration Date: 02/06/2017    Order Specific Question:  Reason for Exam (SYMPTOM  OR DIAGNOSIS REQUIRED)    Answer:  shortness of breath, cough    Order Specific Question:  Preferred imaging location?    Answer:  External  . CT Angio Chest W/Cm &/Or Wo Cm    Standing Status: Future     Number of Occurrences:      Standing Expiration Date: 05/09/2017    Order Specific Question:  If indicated for the ordered procedure, I authorize the administration of contrast media per Radiology protocol    Answer:  Yes    Order Specific Question:  Reason for Exam (SYMPTOM  OR DIAGNOSIS REQUIRED)    Answer:  shortness of breath,  DOE chronic with recent worsening    Order Specific Question:  Preferred imaging location?    Answer:  [facility]  . Comprehensive metabolic panel  . D-dimer, quantitative (not at [facility])  . Ambulatory referral to Cardiology    Referral Priority:  Routine    Referral Type:  Consultation    Referral Reason:  Specialty Services Required    Referred to Provider:  Wellington Hampshire, MD    Requested Specialty:  Cardiology    Number of Visits Requested:  1  . Ambulatory referral to Pulmonology    Referral Priority:  Routine    Referral Type:  Consultation    Referral Reason:  Specialty Services Required    Referred to Provider:  Juanito Doom, MD    Requested Specialty:  Pulmonary Disease    Number of Visits Requested:  1  . POCT CBC  . POCT SEDIMENTATION RATE  . EKG 12-Lead   No orders of the defined types were placed in this encounter.    No Follow-up on file.    Stacey Cline, M.D. [facility] [address] [place], [place]  [address] [phone] phone [phone] fax

## 2016-02-08 ENCOUNTER — Telehealth: Payer: Self-pay | Admitting: Emergency Medicine

## 2016-02-08 ENCOUNTER — Encounter: Payer: Self-pay | Admitting: Internal Medicine

## 2016-02-08 ENCOUNTER — Ambulatory Visit (INDEPENDENT_AMBULATORY_CARE_PROVIDER_SITE_OTHER): Payer: BLUE CROSS/BLUE SHIELD | Admitting: Internal Medicine

## 2016-02-08 VITALS — BP 118/66 | HR 61 | Ht 66.0 in | Wt 192.2 lb

## 2016-02-08 DIAGNOSIS — R0600 Dyspnea, unspecified: Secondary | ICD-10-CM | POA: Insufficient documentation

## 2016-02-08 DIAGNOSIS — I498 Other specified cardiac arrhythmias: Secondary | ICD-10-CM

## 2016-02-08 DIAGNOSIS — I499 Cardiac arrhythmia, unspecified: Secondary | ICD-10-CM

## 2016-02-08 DIAGNOSIS — R001 Bradycardia, unspecified: Secondary | ICD-10-CM | POA: Insufficient documentation

## 2016-02-08 DIAGNOSIS — R0689 Other abnormalities of breathing: Secondary | ICD-10-CM

## 2016-02-08 DIAGNOSIS — I493 Ventricular premature depolarization: Secondary | ICD-10-CM | POA: Insufficient documentation

## 2016-02-08 LAB — NITRIC OXIDE: NITRIC OXIDE: 12

## 2016-02-08 NOTE — Progress Notes (Signed)
Subjective:     Patient ID: Stacey Cline, female   DOB: Jan 13, 1956, 60 y.o.   MRN: [id number] PCP Reginia Forts, MD   HPI  IOV 02/08/2016  Chief Complaint  Patient presents with  . Pulmonary Consult    referred by Dr Reginia Forts for DOE x2 months, progressively worsening x2 days.  reports wheezing w exertion, dry cough.  denies tightness, congestion, mucus production, CP, f/c/s    60 year old obese female. Nonsmoker. Has insidious onset of shortness of breath for the last 6 months. It is progressive. Current severity severe. A few months ago she got dyspneic climbing stairs but now she gets dyspneic even talking or going from a car to the front porch off our office. She feels it is terrible. She frequently travels a lot on car journeys of over 5 hours. 6 months to a year ago she did go by car to [place] before heading out to the [place] for [place]. There is some occasional mild intermittent wheeze but overall this is not a prominent feature. This no cough. This no associated chest pain. She recollects a cardiac stress test 3 years ago by Dr. Midge Minium that was normal. Review of the chart shows that was done on 06/07/2013 and she exercised for 7 mets. It was normal. Is a low risk scan with ejection fraction of 72%. This no bleeding associated with this. This no orthopnea or paroxysmal nocturnal dyspnea   RN felt pulse to be low and HR 40 on repeat eval (intake was 60) - 12 lead with [place] - visualized - has Ventricular Bigeminy  Hemoglobin 14.1 g percent in 04/11/2    Blood work creatinine 1.09 mg percent 02/07/2016  D-dimer 0.26 and normal on 02/07/2016  FEno 02/08/2016 - 12 ppb and normal     The following chest x-ray personally visualized Dg Chest 2 View  02/07/2016  CLINICAL DATA:  Shortness of breath today. EXAM: CHEST  2 VIEW COMPARISON:  PA and lateral chest 04/28/2013. FINDINGS: The lungs are clear. Heart size is normal. No pneumothorax or  pleural effusion. Mild appearing degenerative disease at thoracolumbar junction noted. IMPRESSION: No acute disease. Electronically Signed   By: Inge Rise M.D.   On: 02/07/2016 11:16    Walking desaturation test on 02/08/2016 185 feet x 3 laps:  did NOT desaturate. Rest pulse ox was 99%, final pulse ox was 99%. HR response 45/min at rest to 56/min at peak exertion. (EKG yesterday was personally visualized with a heart rate of 72/m and in April 2017 was 89/min)    She is traveling to [place] early next week and is asking for expeditiuus eval even if it means exposure to radiologic dye and radiation via CT   has a past medical history of Leiomyoma of uterus, unspecified; Pain in joint, multiple sites; Candidiasis of vulva and vagina; Pure hypercholesterolemia; Postsurgical hypothyroidism; Unspecified vitamin D deficiency; Papanicolaou smear of cervix with atypical squamous cells of undetermined significance (ASC-US); Pain in joint, shoulder region; Ventral hernia, unspecified, without mention of obstruction or gangrene; and Menopause (age 10).   reports that she has never smoked. She does not have any smokeless tobacco history on file.  Past Surgical History  Procedure Laterality Date  . Tonsillectomy and adenoidectomy  1960's  . Breast biopsy  2006    Right  Benign  . Total thyroidectomy  2008  . Cosmetic surgery      feet    Allergies  Allergen Reactions  . Fish Allergy   .  Penicillins Rash    Immunization History  Administered Date(s) Administered  . Hepatitis B 09/19/2004  . Td 08/19/2006    Family History  Problem Relation Age of Onset  . Breast cancer    . Hypertension    . Diabetes type II    . Heart disease Father     CAD/3  AMI in 54's   . Stroke Father     several  . Lung disease Father   . Hypertension Mother   . Diabetes Mother   . Heart disease Mother 52    AMI several; first unsure age.  Pacemaker  . Hyperlipidemia Mother   . Kidney failure  Mother   . Diabetes Sister   . Cancer Sister 66    Breast cancer  . Hypertension Brother      Current outpatient prescriptions:  .  Calcium Carbonate-Vitamin D (CALCIUM 500 + D) 500-125 MG-UNIT TABS, Take by mouth daily. Reported on 11/28/2015, Disp: , Rfl:  .  levothyroxine (SYNTHROID, LEVOTHROID) 50 MCG tablet, Take 50 mcg by mouth daily., Disp: , Rfl:  .  simvastatin (ZOCOR) 40 MG tablet, Take 1 tablet (40 mg total) by mouth daily at 6 PM., Disp: 90 tablet, Rfl: 1    Review of Systems  Constitutional: Positive for unexpected weight change. Negative for fever.  HENT: Negative for congestion, dental problem, ear pain, nosebleeds, postnasal drip, rhinorrhea, sinus pressure, sneezing, sore throat and trouble swallowing.   Eyes: Negative for redness and itching.  Respiratory: Positive for cough and shortness of breath. Negative for chest tightness and wheezing.   Cardiovascular: Positive for palpitations. Negative for leg swelling.  Gastrointestinal: Negative for nausea and vomiting.  Genitourinary: Negative for dysuria.  Musculoskeletal: Positive for joint swelling.  Skin: Negative for rash.  Neurological: Negative for headaches.  Hematological: Does not bruise/bleed easily.  Psychiatric/Behavioral: Negative for dysphoric mood. The patient is not nervous/anxious.        Objective:   Physical Exam  Constitutional: She is oriented to person, place, and time. She appears well-developed and well-nourished. No distress.  Body mass index is 31.04 kg/(m^2).   HENT:  Head: Normocephalic and atraumatic.  Right Ear: External ear normal.  Left Ear: External ear normal.  Mouth/Throat: Oropharynx is clear and moist. No oropharyngeal exudate.  Eyes: Conjunctivae and EOM are normal. Pupils are equal, round, and reactive to light. Right eye exhibits no discharge. Left eye exhibits no discharge. No scleral icterus.  Neck: Normal range of motion. Neck supple. No JVD present. No tracheal  deviation present. No thyromegaly present.  Cardiovascular: Normal rate, regular rhythm, normal heart sounds and intact distal pulses.  Exam reveals no gallop and no friction rub.   No murmur heard. Pulmonary/Chest: Effort normal and breath sounds normal. No respiratory distress. She has no wheezes. She has no rales. She exhibits no tenderness.  Abdominal: Soft. Bowel sounds are normal. She exhibits no distension and no mass. There is no tenderness. There is no rebound and no guarding.  Musculoskeletal: Normal range of motion. She exhibits no edema or tenderness.  Lymphadenopathy:    She has no cervical adenopathy.  Neurological: She is alert and oriented to person, place, and time. She has normal reflexes. No cranial nerve deficit. She exhibits normal muscle tone. Coordination normal.  Skin: Skin is warm and dry. No rash noted. She is not diaphoretic. No erythema. No pallor.  Psychiatric: She has a normal mood and affect. Her behavior is normal. Judgment and thought content normal.  Vitals reviewed.  Filed Vitals:   02/08/16 1520  BP: 118/66  Pulse: 61  Height: 5\' 6"  (1.676 m)  Weight: 192 lb 3.2 oz (87.181 kg)  SpO2: 98%         Assessment:       ICD-9-CM ICD-10-CM   1. Dyspnea and respiratory abnormality 786.09 R06.00 CT Angio Chest W/Cm &/Or Wo Cm    R06.89   2. Bradycardia 427.89 R00.1 EKG 12-Lead     EKG 12-Lead     CT Angio Chest W/Cm &/Or Wo Cm  3. Bigeminy 427.89 I49.9    I have feeling that is the bigeminy making her dyspneic. However, she wont be able to see cards for over a week. She is [place] and this travel puts her at risk for PE. Though her d-dimer is normal she is at risk for PE gien obesity and sedendatry living and repeated long travel. And given time crunch best to adopt gold std and rule out PE with CTA - alos look for other etioology. I explaned the risks of dye and radiation to her and she is willing to have CTA .  In fact has one scheduled fo rnext week  Re bigeminii: will see if cards can see her sooner     Plan:      Do CTAngio chest 02/09/16  SEe if cardiology can see you 02/09/16 -  My office will try to arrange  followup Will call with results and plan next step     Dr. Brand Males, M.D., [facility].C.P Pulmonary and Critical Care Medicine Staff Physician [facility] Pager: [phone], If no answer or between  15:00h - 7:00h: call [phone]  02/08/2016 4:29 PM

## 2016-02-08 NOTE — Patient Instructions (Addendum)
ICD-9-CM ICD-10-CM   1. Dyspnea and respiratory abnormality 786.09 R06.00 CT Angio Chest W/Cm &/Or Wo Cm    R06.89   2. Bradycardia 427.89 R00.1 EKG 12-Lead     EKG 12-Lead     CT Angio Chest W/Cm &/Or Wo Cm  3. Bigeminy 427.89 I49.9      Do CTAngio chest 02/09/16  SEe if cardiology can see you 02/09/16 -  My office will try to arrange  followup Will call with results and plan next step

## 2016-02-08 NOTE — Telephone Encounter (Signed)
Pt is scheduled for CT Angio @ [facility] on 7/3 @ 10am Left message with scheduled day and arrival time is 945am

## 2016-02-09 ENCOUNTER — Telehealth: Payer: Self-pay | Admitting: Internal Medicine

## 2016-02-09 ENCOUNTER — Ambulatory Visit (INDEPENDENT_AMBULATORY_CARE_PROVIDER_SITE_OTHER)
Admission: RE | Admit: 2016-02-09 | Discharge: 2016-02-09 | Disposition: A | Discharge status: Home / Self Care | Payer: BLUE CROSS/BLUE SHIELD | Source: Ambulatory Visit | Attending: Internal Medicine | Admitting: Internal Medicine

## 2016-02-09 DIAGNOSIS — R0600 Dyspnea, unspecified: Secondary | ICD-10-CM | POA: Diagnosis not present

## 2016-02-09 DIAGNOSIS — R001 Bradycardia, unspecified: Secondary | ICD-10-CM

## 2016-02-09 DIAGNOSIS — R0689 Other abnormalities of breathing: Secondary | ICD-10-CM

## 2016-02-09 MED ORDER — IOPAMIDOL (ISOVUE-370) INJECTION 76%
80.0000 mL | Freq: Once | INTRAVENOUS | Status: AC | PRN
  Administered 2016-02-09: 80 mL via INTRAVENOUS

## 2016-02-09 NOTE — Telephone Encounter (Signed)
  Let Stacey Cline know that NO PE. No LUNG PROBLEMS. There is small bilateral effusions + that bigeimnii on EKG yesterday my suspicion is that shortness of breath is heart related. She needs to see cards. I cannot advise if safe to travel 02/12/16 to [place].   Give fu in 1-2 months to see me back    Dg Chest 2 View  02/07/2016  CLINICAL DATA:  Shortness of breath today. EXAM: CHEST  2 VIEW COMPARISON:  PA and lateral chest 04/28/2013. FINDINGS: The lungs are clear. Heart size is normal. No pneumothorax or pleural effusion. Mild appearing degenerative disease at thoracolumbar junction noted. IMPRESSION: No acute disease. Electronically Signed   By: Inge Rise M.D.   On: 02/07/2016 11:16   Ct Angio Chest W/cm &/or Wo Cm  02/09/2016  CLINICAL DATA:  Short of breath for 2 months worse in the past 2 days EXAM: CT ANGIOGRAPHY CHEST WITH CONTRAST TECHNIQUE: Multidetector CT imaging of the chest was performed using the standard protocol during bolus administration of intravenous contrast. Multiplanar CT image reconstructions and MIPs were obtained to evaluate the vascular anatomy. CONTRAST:  80 mL Isovue 370 COMPARISON:  02/07/2016 chest radiograph FINDINGS: Mediastinum/Nodes: Thoracic inlet is normal. Heart appears mildly enlarged. No pericardial effusion is thoracic aorta is not dilated. No filling defects in the pulmonary arterial system. No pericardial effusion. Lungs/Pleura: Lungs are clear. There is no infiltrate, mass, or consolidation. There are small bilateral pleural effusions. No definite pulmonary edema. Upper abdomen: No acute findings. Contrast does reflux from the right atrium into the inferior vena cava and hepatic veins. Musculoskeletal: Negative. Upper thoracic dorsal left-sided varices noted. Review of the MIP images confirms the above findings. IMPRESSION: Small bilateral pleural effusions. No evidence of pulmonary embolism. Contrast refluxing into inferior vena cava and  hepatic veins. Correlate for any evidence of diastolic dysfunction. Electronically Signed   By: Skipper Cliche M.D.   On: 02/09/2016 14:36

## 2016-02-09 NOTE — Telephone Encounter (Signed)
Results have been explained to patient, pt expressed understanding. Pt aware of rec's MR - pt states that she is still going to travel to [place]. Pt aware to schedule appt with Cards. Refused to schedule f/u appt with our office, states she will call back. Nothing further needed.

## 2016-02-09 NOTE — Telephone Encounter (Signed)
Pt calling [phone] Stacey Cline

## 2016-02-12 NOTE — Addendum Note (Signed)
Addended by: Parke Poisson E on: 02/12/2016 09:18 AM   Modules accepted: Orders

## 2016-02-19 ENCOUNTER — Ambulatory Visit: Payer: Self-pay

## 2016-03-04 ENCOUNTER — Encounter: Payer: Self-pay | Admitting: Family Medicine

## 2016-03-05 ENCOUNTER — Telehealth: Payer: Self-pay | Admitting: Cardiovascular Disease

## 2016-03-05 ENCOUNTER — Other Ambulatory Visit
Admission: RE | Admit: 2016-03-05 | Discharge: 2016-03-05 | Disposition: A | Discharge status: Home / Self Care | Payer: BLUE CROSS/BLUE SHIELD | Source: Ambulatory Visit | Attending: Cardiovascular Disease | Admitting: Cardiovascular Disease

## 2016-03-05 ENCOUNTER — Ambulatory Visit (INDEPENDENT_AMBULATORY_CARE_PROVIDER_SITE_OTHER): Payer: BLUE CROSS/BLUE SHIELD | Admitting: Cardiovascular Disease

## 2016-03-05 ENCOUNTER — Encounter: Payer: Self-pay | Admitting: Cardiovascular Disease

## 2016-03-05 VITALS — BP 120/70 | HR 49 | Ht 66.0 in | Wt 187.8 lb

## 2016-03-05 DIAGNOSIS — E559 Vitamin D deficiency, unspecified: Secondary | ICD-10-CM | POA: Insufficient documentation

## 2016-03-05 DIAGNOSIS — Z01812 Encounter for preprocedural laboratory examination: Secondary | ICD-10-CM

## 2016-03-05 DIAGNOSIS — Z7189 Other specified counseling: Secondary | ICD-10-CM

## 2016-03-05 DIAGNOSIS — I493 Ventricular premature depolarization: Secondary | ICD-10-CM

## 2016-03-05 DIAGNOSIS — I200 Unstable angina: Secondary | ICD-10-CM

## 2016-03-05 DIAGNOSIS — Z7689 Persons encountering health services in other specified circumstances: Secondary | ICD-10-CM

## 2016-03-05 LAB — PROTIME-INR
INR: 1.11
Prothrombin Time: 14.5 seconds (ref 11.4–15.0)

## 2016-03-05 LAB — BASIC METABOLIC PANEL
Anion gap: 3 — ABNORMAL LOW (ref 5–15)
BUN: 18 mg/dL (ref 6–20)
CHLORIDE: 108 mmol/L (ref 101–111)
CO2: 31 mmol/L (ref 22–32)
CREATININE: 1.1 mg/dL — AB (ref 0.44–1.00)
Calcium: 9.5 mg/dL (ref 8.9–10.3)
GFR calc Af Amer: >60 mL/min (ref >60)
GFR calc non Af Amer: 53 mL/min — ABNORMAL LOW (ref >60)
Glucose, Bld: 79 mg/dL (ref 65–99)
POTASSIUM: 4.4 mmol/L (ref 3.5–5.1)
Sodium: 142 mmol/L (ref 135–145)

## 2016-03-05 LAB — CBC
HEMATOCRIT: 41.5 % (ref 35.0–47.0)
Hemoglobin: 14.1 g/dL (ref 12.0–16.0)
MCH: 28.8 pg (ref 26.0–34.0)
MCHC: 33.9 g/dL (ref 32.0–36.0)
MCV: 84.8 fL (ref 80.0–100.0)
PLATELETS: 158 10*3/uL (ref 150–440)
RBC: 4.89 MIL/uL (ref 3.80–5.20)
RDW: 14.8 % — AB (ref 11.5–14.5)
WBC: 7.1 10*3/uL (ref 3.6–11.0)

## 2016-03-05 MED ORDER — PREDNISONE 20 MG PO TABS: ORAL_TABLET | ORAL | Status: DC

## 2016-03-05 NOTE — Progress Notes (Signed)
Cardiology Office Note   Date:  03/05/2016   ID:  Stacey Cline, DOB September 12, 1955, MRN [id number]  PCP:  Reginia Forts, MD  Cardiologist:   [name], MD   Chief Complaint  Patient presents with  . Establish Care    re-establish care per pt      History of Present Illness: Stacey Cline is a 60 y.o. female who Was referred by Dr. Tamala Julian and Dr. Chase Caller for evaluation of exertional dyspnea and ventricular bigeminy.  She has known history of treated hyperlipidemia and hypothyroidism. She is a lifelong nonsmoker. She does have family history of premature coronary artery disease.  She was seen by me in 2014 for atypical chest pain. She underwent nuclear stress test which showed no evidence of ischemia with normal ejection fraction. She had no recurrent symptoms at that time. Recently, she started having progressive exertional dyspnea and fatigue. This started about 3 months ago but then it progressed significantly over the last few weeks. The symptoms are currently happening with minimal activities even if she tries to go up 3 steps or walking short distance. Symptoms are associated with orthopnea but no PND, leg edema or weight gain. She was seen by Dr. Chase Caller for her symptoms. She underwent CTA of the chest which showed no evidence of pulmonary embolism. However, she was noted to have small bilateral pleural effusions with complex refluxing into the inferior vena cava. She had a mild contrast reaction with rash. She was noted to have ventricular bigeminy and thus she was referred to [place] for further evaluation. She denies chest pain at the present time.   Past Medical History  Diagnosis Date  . Leiomyoma of uterus, unspecified   . Pain in joint, multiple sites   . Candidiasis of vulva and vagina   . Pure hypercholesterolemia   . Postsurgical hypothyroidism   . Unspecified vitamin D deficiency   . Papanicolaou smear of cervix with atypical squamous cells of  undetermined significance (ASC-US)   . Pain in joint, shoulder region   . Ventral hernia, unspecified, without mention of obstruction or gangrene   . Menopause age 77    Past Surgical History  Procedure Laterality Date  . Tonsillectomy and adenoidectomy  1960's  . Breast biopsy  2006    Right  Benign  . Total thyroidectomy  2008  . Cosmetic surgery      feet     Current Outpatient Prescriptions  Medication Sig Dispense Refill  . Calcium Carbonate-Vitamin D (CALCIUM 500 + D) 500-125 MG-UNIT TABS Take by mouth daily. Reported on 11/28/2015    . levothyroxine (SYNTHROID, LEVOTHROID) 50 MCG tablet Take 50 mcg by mouth daily.    . simvastatin (ZOCOR) 40 MG tablet Take 1 tablet (40 mg total) by mouth daily at 6 PM. 90 tablet 1  . predniSONE (DELTASONE) 20 MG tablet Take 40mg  this afternoon, bedtime and tomorrow morning 6 tablet 0   No current facility-administered medications for this visit.    Allergies:   Fish allergy; Isovue; and Penicillins    Social History:  The patient  reports that she has never smoked. She does not have any smokeless tobacco history on file. She reports that she does not drink alcohol or use illicit drugs.   Family History:  The patient's family history includes Cancer (age of onset: 15) in her sister; Diabetes in her mother and sister; Heart disease in her father; Heart disease (age of onset: 11) in her mother; Hyperlipidemia in her mother; Hypertension in  her brother and mother; Kidney failure in her mother; Lung disease in her father; Stroke in her father.    ROS:  Please see the history of present illness.   Otherwise, review of systems are positive for none.   All other systems are reviewed and negative.    PHYSICAL EXAM: VS:  BP 120/70 mmHg  Pulse 49  Ht 5\' 6"  (1.676 m)  Wt 187 lb 12.8 oz (85.186 kg)  BMI 30.33 kg/m2  SpO2 96% , BMI Body mass index is 30.33 kg/(m^2). GEN: Well nourished, well developed, in no acute distress HEENT: normal Neck:  no JVD, carotid bruits, or masses Cardiac: RRR; no murmurs, rubs, or gallops,no edema  Respiratory:  clear to auscultation bilaterally, normal work of breathing GI: soft, nontender, nondistended, + BS MS: no deformity or atrophy Skin: warm and dry, no rash Neuro:  Strength and sensation are intact Psych: euthymic mood, full affect   EKG:  EKG is ordered today. The ekg ordered today demonstrates normal sinus rhythm with frequent PVCs some in the form of right ventricular bigeminy with nonspecific T wave changes.   Recent Labs: 11/28/2015: TSH 0.73 02/07/2016: ALT 25 03/05/2016: BUN 18; Creatinine, Ser 1.10*; Hemoglobin 14.1; Platelets 158; Potassium 4.4; Sodium 142    Lipid Panel    Component Value Date/Time   CHOL 183 11/28/2015 0903   TRIG 134 11/28/2015 0903   HDL 49 11/28/2015 0903   CHOLHDL 3.7 11/28/2015 0903   VLDL 27 11/28/2015 0903   LDLCALC 107 11/28/2015 0903      Wt Readings from Last 3 Encounters:  03/05/16 187 lb 12.8 oz (85.186 kg)  02/08/16 192 lb 3.2 oz (87.181 kg)  02/07/16 192 lb (87.091 kg)        ASSESSMENT AND PLAN:  1.  Unstable angina: The patient's present with progressive symptoms of exertional dyspnea currently happening even with very minimal activities. This in association with her frequent PVCs is concerning for unstable angina. The CT scan findings recently also are suggestive of congestive heart failure which is a new finding. Given all of this, I recommend proceeding with urgent cardiac catheterization and possible coronary intervention. Due to concerns about heart failure, I also recommended a right heart catheterization at the time of left heart catheterization. I discussed risks, benefits and alternatives with the patient and her husband. She will be pretreated with prednisone for mild contrast allergy.  2. 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 Recent  labs were unremarkable including thyroid function. I requested a 24-hour Holter monitor to quantify her PVCs. PVCs might be contributing to cardiomyopathy and further treatment of this will be decided based on the above workup. She might require EP consultation.    Disposition:   FU with me in 3 weeks  Signed,  [name], MD  03/05/2016 5:47 PM    [place]

## 2016-03-05 NOTE — Patient Instructions (Addendum)
Medication Instructions:  Your physician has recommended you make the following change in your medication:  TAKE prednisone 40mg  this evening, at bedtime tonight and in the morning before your procedure.   Labwork: CBC, BMET, PT/INR at the [facility] lab today   Testing/Procedures: Your physician has recommended that you wear a 24 hour holter monitor. Holter monitors are medical devices that record the heart's electrical activity. Doctors most often use these monitors to diagnose arrhythmias. Arrhythmias are problems with the speed or rhythm of the heartbeat. The monitor is a small, portable device. You can wear one while you do your normal daily activities. This is usually used to diagnose what is causing palpitations/syncope (passing out).   Your physician has requested that you have a cardiac catheterization. Cardiac catheterization is used to diagnose and/or treat various heart conditions. Doctors may recommend this procedure for a number of different reasons. The most common reason is to evaluate chest pain. Chest pain can be a symptom of coronary artery disease (CAD), and cardiac catheterization can show whether plaque is narrowing or blocking your heart's arteries. This procedure is also used to evaluate the valves, as well as measure the blood flow and oxygen levels in different parts of your heart. For further information please visit [employer]uge[employer]iest[employer]tn. Please follow instruction sheet, as given.  [facility] Cardiac Cath Instructions   You are scheduled for a Cardiac Cath on Wednesday, July 19  Please arrive at 8am on the day of your procedure  Please expect a call from our [place] to pre-register you  Do not eat/drink anything after midnight  Someone will need to drive you home  It is recommended someone be with you for the first 24 hours after your procedure  Wear clothes that are easy to get on/off and wear slip on shoes if possible  [facility] [place] [phone]  Entrance A, Short Stay  Medications bring a current list of all medications with you  _xx__ You may take all of your medications the morning of your procedure with enough water to swallow safely    The usual length of stay after your procedure is about 2 to 3 hours.  This can vary.  If you have any questions, please call our office at [phone], or you may call the cardiac cath lab at [facility] directly at [phone]   Follow-Up: Your physician recommends that you schedule a follow-up appointment after cardiac cath with Dr. Fletcher Anon.    Any Other Special Instructions Will Be Listed Below (If Applicable).     If you need a refill on your cardiac medications before your next appointment, please call your pharmacy.  Angiogram An angiogram, also called angiography, is a procedure used to look at the blood vessels. In this procedure, dye is injected through a long, thin tube (catheter) into an artery. X-rays are then taken. The X-rays will show if there is a blockage or problem in a blood vessel.  LET [facility] CARE PROVIDER KNOW ABOUT:  Any allergies you have, including allergies to shellfish or contrast dye.   All medicines you are taking, including vitamins, herbs, eye drops, creams, and over-the-counter medicines.   Previous problems you or members of your family have had with the use of anesthetics.   Any blood disorders you have.   Previous surgeries you have had.  Any previous kidney problems or failure you have had.  Medical conditions you have.   Possibility of pregnancy, if  this applies. RISKS AND COMPLICATIONS Generally, an angiogram is a safe procedure. However, as with any procedure, problems can occur. Possible problems include:  Injury to the blood vessels, including rupture or bleeding.  Infection or bruising at the catheter site.  Allergic reaction to the dye or contrast  used.  Kidney damage from the dye or contrast used.  Blood clots that can lead to a stroke or heart attack. BEFORE THE PROCEDURE  Do not eat or drink after midnight on the night before the procedure, or as directed by your health care provider.   Ask your health care provider if you may drink enough water to take any needed medicines the morning of the procedure.  PROCEDURE  You may be given a medicine to help you relax (sedative) before and during the procedure. This medicine is given through an IV access tube that is inserted into one of your veins.   The area where the catheter will be inserted will be washed and shaved. This is usually done in the groin but may be done in the fold of your arm (near your elbow) or in the wrist.  A medicine will be given to numb the area where the catheter will be inserted (local anesthetic).  The catheter will be inserted with a guide wire into an artery. The catheter is guided by using a type of X-ray (fluoroscopy) to the blood vessel being examined.   Dye is then injected into the catheter, and X-rays are taken. The dye helps to show where any narrowing or blockages are located.  AFTER THE PROCEDURE   If the procedure is done through the leg, you will be kept in bed lying flat for several hours. You will be instructed to not bend or cross your legs.  The insertion site will be checked frequently.  The pulse in your feet or wrist will be checked frequently.  Additional blood tests, X-rays, and electrocardiography may be done.   You may need to stay in the hospital overnight for observation.    This information is not intended to replace advice given to you by your health care provider. Make sure you discuss any questions you have with your health care provider.   Document Released: 05/15/2005 Document Revised: 08/26/2014 Document Reviewed: 01/06/2013 Elsevier Interactive Patient Education 2016 [place]  After These instructions give you information about caring for yourself after your procedure. Your doctor may also give you more specific instructions. Call your doctor if you have any problems or questions after your procedure.  HOME CARE  Take medicines only as told by your doctor.  Follow your doctor's instructions about:  Care of the area where the tube was inserted.  Bandage (dressing) changes and removal.  You may shower 24-48 hours after the procedure or as told by your doctor.  Do not take baths, swim, or use a hot tub until your doctor approves.  Every day, check the area where the tube was inserted. Watch for:  Redness, swelling, or pain.  Fluid, blood, or pus.  Do not apply powder or lotion to the site.  Do not lift anything that is heavier than 10 lb (4.5 kg) for 5 days or as told by your doctor.  Ask your doctor when you can:  Return to work or school.  Do physical activities or play sports.  Have sex.  Do not drive or operate heavy machinery for 24 hours or as told by your doctor.  Have someone with you for  the first 24 hours after the procedure.  Keep all follow-up visits as told by your doctor. This is important. GET HELP IF:  You have a fever.   You have chills.   You have more bleeding from the area where the tube was inserted. Hold pressure on the area.  You have redness, swelling, or pain in the area where the tube was inserted.  You have fluid or pus coming from the area. GET HELP RIGHT AWAY IF:   You have a lot of pain in the area where the tube was inserted.  The area where the tube was inserted is bleeding, and the bleeding does not stop after 30 minutes of holding steady pressure on the area.  The area near or just beyond the insertion site becomes pale, cool, tingly, or numb.   This information is not intended to replace advice given to you by your health care provider. Make sure you discuss any questions you have with your health  care provider.   Document Released: 11/01/2008 Document Revised: 08/26/2014 Document Reviewed: 01/06/2013 Elsevier Interactive Patient Education 2016 [employer] Holter Monitoring A Holter monitor is a small device that is used to detect abnormal heart rhythms. It clips to your clothing and is connected by wires to flat, sticky disks (electrodes) that attach to your chest. It is worn continuously for 24-48 hours. HOME CARE INSTRUCTIONS  Wear your Holter monitor at all times, even while exercising and sleeping, for as long as directed by your health care provider.  Make sure that the Holter monitor is safely clipped to your clothing or close to your body as recommended by your health care provider.  Do not get the monitor or wires wet.  Do not put body lotion or moisturizer on your chest.  Keep your skin clean.  Keep a diary of your daily activities, such as walking and doing chores. If you feel that your heartbeat is abnormal or that your heart is fluttering or skipping a beat:  Record what you are doing when it happens.  Record what time of day the symptoms occur.  Return your Holter monitor as directed by your health care provider.  Keep all follow-up visits as directed by your health care provider. This is important. SEEK IMMEDIATE MEDICAL CARE IF:  You feel lightheaded or you faint.  You have trouble breathing.  You feel pain in your chest, upper arm, or jaw.  You feel sick to your stomach and your skin is pale, cool, or damp.  You heartbeat feels unusual or abnormal.   This information is not intended to replace advice given to you by your health care provider. Make sure you discuss any questions you have with your health care provider.   Document Released: 05/03/2004 Document Revised: 08/26/2014 Document Reviewed: 03/14/2014 Elsevier Interactive Patient Education [employer].

## 2016-03-05 NOTE — Telephone Encounter (Signed)
Left message on pt VM of 7/24, 9:00am appt for holter monitor placement. Provided CB number if questions.

## 2016-03-06 ENCOUNTER — Encounter (HOSPITAL_COMMUNITY): Payer: Self-pay | Admitting: Cardiovascular Disease

## 2016-03-06 ENCOUNTER — Ambulatory Visit (HOSPITAL_COMMUNITY)
Admission: RE | Admit: 2016-03-06 | Discharge: 2016-03-06 | Disposition: A | Discharge status: Home / Self Care | Payer: BLUE CROSS/BLUE SHIELD | Source: Ambulatory Visit | Attending: Cardiovascular Disease | Admitting: Cardiovascular Disease

## 2016-03-06 ENCOUNTER — Encounter (HOSPITAL_COMMUNITY)
Admission: RE | Disposition: A | Discharge status: Home / Self Care | Payer: Self-pay | Source: Ambulatory Visit | Attending: Cardiovascular Disease

## 2016-03-06 DIAGNOSIS — Z823 Family history of stroke: Secondary | ICD-10-CM | POA: Insufficient documentation

## 2016-03-06 DIAGNOSIS — E890 Postprocedural hypothyroidism: Secondary | ICD-10-CM | POA: Insufficient documentation

## 2016-03-06 DIAGNOSIS — Z833 Family history of diabetes mellitus: Secondary | ICD-10-CM | POA: Insufficient documentation

## 2016-03-06 DIAGNOSIS — E7800 Pure hypercholesterolemia, unspecified: Secondary | ICD-10-CM | POA: Diagnosis not present

## 2016-03-06 DIAGNOSIS — E559 Vitamin D deficiency, unspecified: Secondary | ICD-10-CM | POA: Insufficient documentation

## 2016-03-06 DIAGNOSIS — R0600 Dyspnea, unspecified: Secondary | ICD-10-CM | POA: Insufficient documentation

## 2016-03-06 DIAGNOSIS — E785 Hyperlipidemia, unspecified: Secondary | ICD-10-CM | POA: Insufficient documentation

## 2016-03-06 DIAGNOSIS — Z8249 Family history of ischemic heart disease and other diseases of the circulatory system: Secondary | ICD-10-CM | POA: Diagnosis not present

## 2016-03-06 DIAGNOSIS — I493 Ventricular premature depolarization: Secondary | ICD-10-CM | POA: Insufficient documentation

## 2016-03-06 DIAGNOSIS — I25110 Atherosclerotic heart disease of native coronary artery with unstable angina pectoris: Secondary | ICD-10-CM | POA: Diagnosis not present

## 2016-03-06 DIAGNOSIS — I200 Unstable angina: Secondary | ICD-10-CM | POA: Insufficient documentation

## 2016-03-06 DIAGNOSIS — D259 Leiomyoma of uterus, unspecified: Secondary | ICD-10-CM | POA: Insufficient documentation

## 2016-03-06 DIAGNOSIS — Z91041 Radiographic dye allergy status: Secondary | ICD-10-CM | POA: Diagnosis not present

## 2016-03-06 DIAGNOSIS — Z880 Allergy status to penicillin: Secondary | ICD-10-CM | POA: Diagnosis not present

## 2016-03-06 HISTORY — PX: CARDIAC CATHETERIZATION: SHX172

## 2016-03-06 LAB — POCT I-STAT 3, ART BLOOD GAS (G3+)
ACID-BASE DEFICIT: 3 mmol/L — AB (ref 0.0–2.0)
BICARBONATE: 22.6 meq/L (ref 20.0–24.0)
O2 Saturation: 99 %
PH ART: 7.336 — AB (ref 7.350–7.450)
PO2 ART: 124 mmHg — AB (ref 80.0–100.0)
TCO2: 24 mmol/L (ref 0–100)
pCO2 arterial: 42.4 mmHg (ref 35.0–45.0)

## 2016-03-06 LAB — POCT I-STAT 3, VENOUS BLOOD GAS (G3P V)
Acid-base deficit: 3 mmol/L — ABNORMAL HIGH (ref 0.0–2.0)
Bicarbonate: 23.2 mEq/L (ref 20.0–24.0)
O2 SAT: 69 %
PCO2 VEN: 45.3 mmHg (ref 45.0–50.0)
PO2 VEN: 39 mmHg (ref 31.0–45.0)
TCO2: 25 mmol/L (ref 0–100)
pH, Ven: 7.317 — ABNORMAL HIGH (ref 7.250–7.300)

## 2016-03-06 SURGERY — RIGHT/LEFT HEART CATH AND CORONARY ANGIOGRAPHY

## 2016-03-06 MED ORDER — MIDAZOLAM HCL 2 MG/2ML IJ SOLN
INTRAMUSCULAR | Status: AC
  Filled 2016-03-06: qty 2

## 2016-03-06 MED ORDER — FENTANYL CITRATE (PF) 100 MCG/2ML IJ SOLN
INTRAMUSCULAR | Status: AC
  Filled 2016-03-06: qty 2

## 2016-03-06 MED ORDER — DIPHENHYDRAMINE HCL 50 MG/ML IJ SOLN
INTRAMUSCULAR | Status: AC
  Administered 2016-03-06: 25 mg via INTRAVENOUS
  Filled 2016-03-06: qty 1

## 2016-03-06 MED ORDER — HEPARIN SODIUM (PORCINE) 1000 UNIT/ML IJ SOLN
INTRAMUSCULAR | Status: AC
  Filled 2016-03-06: qty 1

## 2016-03-06 MED ORDER — FAMOTIDINE IN NACL 20-0.9 MG/50ML-% IV SOLN
20.0000 mg | INTRAVENOUS | Status: AC
  Administered 2016-03-06: 20 mg via INTRAVENOUS

## 2016-03-06 MED ORDER — SODIUM CHLORIDE 0.9% FLUSH: 3.0000 mL | Freq: Two times a day (BID) | INTRAVENOUS | Status: DC

## 2016-03-06 MED ORDER — DIPHENHYDRAMINE HCL 50 MG/ML IJ SOLN
25.0000 mg | INTRAMUSCULAR | Status: AC
  Administered 2016-03-06: 25 mg via INTRAVENOUS

## 2016-03-06 MED ORDER — HEPARIN (PORCINE) IN NACL 2-0.9 UNIT/ML-% IJ SOLN
INTRAMUSCULAR | Status: DC | PRN
  Administered 2016-03-06: 1500 mL

## 2016-03-06 MED ORDER — SODIUM CHLORIDE 0.9 % IV SOLN: 250.0000 mL | INTRAVENOUS | Status: DC | PRN

## 2016-03-06 MED ORDER — HEPARIN (PORCINE) IN NACL 2-0.9 UNIT/ML-% IJ SOLN
INTRAMUSCULAR | Status: AC
  Filled 2016-03-06: qty 1000

## 2016-03-06 MED ORDER — NITROGLYCERIN 1 MG/10 ML FOR IR/CATH LAB
INTRA_ARTERIAL | Status: AC
  Filled 2016-03-06: qty 10

## 2016-03-06 MED ORDER — LIDOCAINE HCL (PF) 1 % IJ SOLN
INTRAMUSCULAR | Status: AC
  Filled 2016-03-06: qty 30

## 2016-03-06 MED ORDER — HEPARIN SODIUM (PORCINE) 1000 UNIT/ML IJ SOLN
INTRAMUSCULAR | Status: DC | PRN
Start: 2016-03-06 — End: 2016-03-06
  Administered 2016-03-06: 4000 [IU] via INTRAVENOUS

## 2016-03-06 MED ORDER — MIDAZOLAM HCL 2 MG/2ML IJ SOLN
INTRAMUSCULAR | Status: DC | PRN
  Administered 2016-03-06 (×2): 1 mg via INTRAVENOUS

## 2016-03-06 MED ORDER — VERAPAMIL HCL 2.5 MG/ML IV SOLN
INTRAVENOUS | Status: DC | PRN
  Administered 2016-03-06: 10 mL via INTRA_ARTERIAL

## 2016-03-06 MED ORDER — METOPROLOL SUCCINATE ER 25 MG PO TB24: 25.0000 mg | ORAL_TABLET | Freq: Every day | ORAL | Status: DC

## 2016-03-06 MED ORDER — SODIUM CHLORIDE 0.9 % IV SOLN
INTRAVENOUS | Status: DC
  Administered 2016-03-06: 08:00:00 via INTRAVENOUS

## 2016-03-06 MED ORDER — FENTANYL CITRATE (PF) 100 MCG/2ML IJ SOLN
INTRAMUSCULAR | Status: DC | PRN
  Administered 2016-03-06: 25 ug via INTRAVENOUS
  Administered 2016-03-06: 50 ug via INTRAVENOUS
  Administered 2016-03-06: 25 ug via INTRAVENOUS

## 2016-03-06 MED ORDER — SODIUM CHLORIDE 0.9% FLUSH: 3.0000 mL | INTRAVENOUS | Status: DC | PRN

## 2016-03-06 MED ORDER — FAMOTIDINE IN NACL 20-0.9 MG/50ML-% IV SOLN
INTRAVENOUS | Status: AC
  Administered 2016-03-06: 20 mg via INTRAVENOUS
  Filled 2016-03-06: qty 50

## 2016-03-06 MED ORDER — SODIUM CHLORIDE 0.9 % WEIGHT BASED INFUSION: 1.0000 mL/kg/h | INTRAVENOUS | Status: DC

## 2016-03-06 MED ORDER — IOPAMIDOL (ISOVUE-370) INJECTION 76%
INTRAVENOUS | Status: DC | PRN
  Administered 2016-03-06: 60 mL via INTRA_ARTERIAL

## 2016-03-06 MED ORDER — ASPIRIN 81 MG PO CHEW
81.0000 mg | CHEWABLE_TABLET | ORAL | Status: AC
  Administered 2016-03-06: 81 mg via ORAL

## 2016-03-06 MED ORDER — VERAPAMIL HCL 2.5 MG/ML IV SOLN
INTRAVENOUS | Status: AC
  Filled 2016-03-06: qty 2

## 2016-03-06 MED ORDER — ASPIRIN 81 MG PO CHEW
CHEWABLE_TABLET | ORAL | Status: AC
  Administered 2016-03-06: 81 mg via ORAL
  Filled 2016-03-06: qty 1

## 2016-03-06 MED ORDER — LIDOCAINE HCL (PF) 1 % IJ SOLN
INTRAMUSCULAR | Status: DC | PRN
  Administered 2016-03-06: 3 mL

## 2016-03-06 SURGICAL SUPPLY — 14 items
CATH BALLN WEDGE 5F 110CM (CATHETERS) ×1 IMPLANT
CATH INFINITI 5 FR JL3.5 (CATHETERS) ×1 IMPLANT
CATH INFINITI 5FR ANG PIGTAIL (CATHETERS) ×1 IMPLANT
CATH OPTITORQUE JACKY 4.0 5F (CATHETERS) ×1 IMPLANT
DEVICE RAD COMP TR BAND LRG (VASCULAR PRODUCTS) ×1 IMPLANT
GLIDESHEATH SLEND SS 6F .021 (SHEATH) ×1 IMPLANT
KIT HEART LEFT (KITS) ×2 IMPLANT
PACK CARDIAC CATHETERIZATION (CUSTOM PROCEDURE TRAY) ×2 IMPLANT
SHEATH FAST CATH BRACH 5F 5CM (SHEATH) ×1 IMPLANT
SYR MEDRAD MARK V 150ML (SYRINGE) ×2 IMPLANT
TRANSDUCER W/STOPCOCK (MISCELLANEOUS) ×3 IMPLANT
TUBING CIL FLEX 10 FLL-RA (TUBING) ×2 IMPLANT
WIRE EMERALD 3MM-J .025X260CM (WIRE) ×1 IMPLANT
WIRE SAFE-T 1.5MM-J .035X260CM (WIRE) ×1 IMPLANT

## 2016-03-06 NOTE — Research (Signed)
CADLAD Informed Consent   Subject Name: Stacey Cline  Subject met inclusion and exclusion criteria.  The informed consent form, study requirements and expectations were reviewed with the subject and questions and concerns were addressed prior to the signing of the consent form.  The subject verbalized understanding of the trail requirements.  The subject agreed to participate in the CADLAD trial and signed the informed consent.  The informed consent was obtained prior to performance of any protocol-specific procedures for the subject.  A copy of the signed informed consent was given to the subject and a copy was placed in the subject's medical record.  [name] 03/06/2016, 10:58 AM

## 2016-03-06 NOTE — Interval H&P Note (Signed)
Cath Lab Visit (complete for each Cath Lab visit)  Clinical Evaluation Leading to the Procedure:   ACS: No.  Non-ACS:    Anginal Classification: CCS IV  Anti-ischemic medical therapy: No Therapy  Non-Invasive Test Results: No non-invasive testing performed  Prior CABG: No previous CABG      History and Physical Interval Note:  03/06/2016 10:30 AM  Stacey Cline  has presented today for surgery, with the diagnosis of unstable angina - pvc  The various methods of treatment have been discussed with the patient and family. After consideration of risks, benefits and other options for treatment, the patient has consented to  Procedure(s): Right/Left Heart Cath and Coronary Angiography (N/A) as a surgical intervention .  The patient's history has been reviewed, patient examined, no change in status, stable for surgery.  I have reviewed the patient's chart and labs.  Questions were answered to the patient's satisfaction.     [name]

## 2016-03-06 NOTE — H&P (View-Only) (Signed)
Cardiology Office Note   Date:  03/05/2016   ID:  Stacey Cline, DOB 05-19-1956, MRN [id number]  PCP:  Reginia Forts, MD  Cardiologist:   [name], MD   Chief Complaint  Patient presents with  . Establish Care    re-establish care per pt      History of Present Illness: Stacey Cline is a 60 y.o. female who Was referred by Dr. Tamala Julian and Dr. Chase Caller for evaluation of exertional dyspnea and ventricular bigeminy.  She has known history of treated hyperlipidemia and hypothyroidism. She is a lifelong nonsmoker. She does have family history of premature coronary artery disease.  She was seen by me in 2014 for atypical chest pain. She underwent nuclear stress test which showed no evidence of ischemia with normal ejection fraction. She had no recurrent symptoms at that time. Recently, she started having progressive exertional dyspnea and fatigue. This started about 3 months ago but then it progressed significantly over the last few weeks. The symptoms are currently happening with minimal activities even if she tries to go up 3 steps or walking short distance. Symptoms are associated with orthopnea but no PND, leg edema or weight gain. She was seen by Dr. Chase Caller for her symptoms. She underwent CTA of the chest which showed no evidence of pulmonary embolism. However, she was noted to have small bilateral pleural effusions with complex refluxing into the inferior vena cava. She had a mild contrast reaction with rash. She was noted to have ventricular bigeminy and thus she was referred to [place] for further evaluation. She denies chest pain at the present time.   Past Medical History  Diagnosis Date  . Leiomyoma of uterus, unspecified   . Pain in joint, multiple sites   . Candidiasis of vulva and vagina   . Pure hypercholesterolemia   . Postsurgical hypothyroidism   . Unspecified vitamin D deficiency   . Papanicolaou smear of cervix with atypical squamous cells of  undetermined significance (ASC-US)   . Pain in joint, shoulder region   . Ventral hernia, unspecified, without mention of obstruction or gangrene   . Menopause age 66    Past Surgical History  Procedure Laterality Date  . Tonsillectomy and adenoidectomy  1960's  . Breast biopsy  2006    Right  Benign  . Total thyroidectomy  2008  . Cosmetic surgery      feet     Current Outpatient Prescriptions  Medication Sig Dispense Refill  . Calcium Carbonate-Vitamin D (CALCIUM 500 + D) 500-125 MG-UNIT TABS Take by mouth daily. Reported on 11/28/2015    . levothyroxine (SYNTHROID, LEVOTHROID) 50 MCG tablet Take 50 mcg by mouth daily.    . simvastatin (ZOCOR) 40 MG tablet Take 1 tablet (40 mg total) by mouth daily at 6 PM. 90 tablet 1  . predniSONE (DELTASONE) 20 MG tablet Take 40mg  this afternoon, bedtime and tomorrow morning 6 tablet 0   No current facility-administered medications for this visit.    Allergies:   Fish allergy; Isovue; and Penicillins    Social History:  The patient  reports that she has never smoked. She does not have any smokeless tobacco history on file. She reports that she does not drink alcohol or use illicit drugs.   Family History:  The patient's family history includes Cancer (age of onset: 64) in her sister; Diabetes in her mother and sister; Heart disease in her father; Heart disease (age of onset: 29) in her mother; Hyperlipidemia in her mother; Hypertension in  her brother and mother; Kidney failure in her mother; Lung disease in her father; Stroke in her father.    ROS:  Please see the history of present illness.   Otherwise, review of systems are positive for none.   All other systems are reviewed and negative.    PHYSICAL EXAM: VS:  BP 120/70 mmHg  Pulse 49  Ht 5\' 6"  (1.676 m)  Wt 187 lb 12.8 oz (85.186 kg)  BMI 30.33 kg/m2  SpO2 96% , BMI Body mass index is 30.33 kg/(m^2). GEN: Well nourished, well developed, in no acute distress HEENT: normal Neck:  no JVD, carotid bruits, or masses Cardiac: RRR; no murmurs, rubs, or gallops,no edema  Respiratory:  clear to auscultation bilaterally, normal work of breathing GI: soft, nontender, nondistended, + BS MS: no deformity or atrophy Skin: warm and dry, no rash Neuro:  Strength and sensation are intact Psych: euthymic mood, full affect   EKG:  EKG is ordered today. The ekg ordered today demonstrates normal sinus rhythm with frequent PVCs some in the form of right ventricular bigeminy with nonspecific T wave changes.   Recent Labs: 11/28/2015: TSH 0.73 02/07/2016: ALT 25 03/05/2016: BUN 18; Creatinine, Ser 1.10*; Hemoglobin 14.1; Platelets 158; Potassium 4.4; Sodium 142    Lipid Panel    Component Value Date/Time   CHOL 183 11/28/2015 0903   TRIG 134 11/28/2015 0903   HDL 49 11/28/2015 0903   CHOLHDL 3.7 11/28/2015 0903   VLDL 27 11/28/2015 0903   LDLCALC 107 11/28/2015 0903      Wt Readings from Last 3 Encounters:  03/05/16 187 lb 12.8 oz (85.186 kg)  02/08/16 192 lb 3.2 oz (87.181 kg)  02/07/16 192 lb (87.091 kg)        ASSESSMENT AND PLAN:  1.  Unstable angina: The patient's present with progressive symptoms of exertional dyspnea currently happening even with very minimal activities. This in association with her frequent PVCs is concerning for unstable angina. The CT scan findings recently also are suggestive of congestive heart failure which is a new finding. Given all of this, I recommend proceeding with urgent cardiac catheterization and possible coronary intervention. Due to concerns about heart failure, I also recommended a right heart catheterization at the time of left heart catheterization. I discussed risks, benefits and alternatives with the patient and her husband. She will be pretreated with prednisone for mild contrast allergy.  2. 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 Recent  labs were unremarkable including thyroid function. I requested a 24-hour Holter monitor to quantify her PVCs. PVCs might be contributing to cardiomyopathy and further treatment of this will be decided based on the above workup. She might require EP consultation.    Disposition:   FU with me in 3 weeks  Signed,  [name], MD  03/05/2016 5:47 PM    [place]

## 2016-03-06 NOTE — Discharge Instructions (Signed)
Radial Site Care Refer to this sheet in the next few weeks. These instructions provide you with information about caring for yourself after your procedure. Your health care provider may also give you more specific instructions. Your treatment has been planned according to current medical practices, but problems sometimes occur. Call your health care provider if you have any problems or questions after your procedure. WHAT TO EXPECT AFTER THE PROCEDURE After your procedure, it is typical to have the following:  Bruising at the radial site that usually fades within 1-2 weeks.  Blood collecting in the tissue (hematoma) that may be painful to the touch. It should usually decrease in size and tenderness within 1-2 weeks. HOME CARE INSTRUCTIONS  Take medicines only as directed by your health care provider.  You may shower 24-48 hours after the procedure or as directed by your health care provider. Remove the bandage (dressing) and gently wash the site with plain soap and water. Pat the area dry with a clean towel. Do not rub the site, because this may cause bleeding.  Do not take baths, swim, or use a hot tub until your health care provider approves.  Check your insertion site every day for redness, swelling, or drainage.  Do not apply powder or lotion to the site.  Do not flex or bend the affected arm for 24 hours or as directed by your health care provider.  Do not push or pull heavy objects with the affected arm for 24 hours or as directed by your health care provider.  Do not lift over 10 lb (4.5 kg) for 5 days after your procedure or as directed by your health care provider.  Ask your health care provider when it is okay to:  Return to work or school.  Resume usual physical activities or sports.  Resume sexual activity.  Do not drive home if you are discharged the same day as the procedure. Have someone else drive you.  You may drive 24 hours after the procedure unless otherwise  instructed by your health care provider.  Do not operate machinery or power tools for 24 hours after the procedure.  If your procedure was done as an outpatient procedure, which means that you went home the same day as your procedure, a responsible adult should be with you for the first 24 hours after you arrive home.  Keep all follow-up visits as directed by your health care provider. This is important. SEEK MEDICAL CARE IF:  You have a fever.  You have chills.  You have increased bleeding from the radial site. Hold pressure on the site. SEEK IMMEDIATE MEDICAL CARE IF:  You have unusual pain at the radial site.  You have redness, warmth, or swelling at the radial site.  You have drainage (other than a small amount of blood on the dressing) from the radial site.  The radial site is bleeding, and the bleeding does not stop after 30 minutes of holding steady pressure on the site.  Your arm or hand becomes pale, cool, tingly, or numb.   This information is not intended to replace advice given to you by your health care provider. Make sure you discuss any questions you have with your health care provider.   Document Released: 09/07/2010 Document Revised: 08/26/2014 Document Reviewed: 02/21/2014 Elsevier Interactive Patient Education 2016 [employer]    Metoprolol extended-release tablets What is this medicine? METOPROLOL (me TOE proe lole) is a beta-blocker. Beta-blockers reduce the workload on the heart and help it to  beat more regularly. This medicine is used to treat high blood pressure and to prevent chest pain. It is also used to after a heart attack and to prevent an additional heart attack from occurring. This medicine may be used for other purposes; ask your health care provider or pharmacist if you have questions. What should I tell my health care provider before I take this medicine? They need to know if you have any of these conditions: -diabetes -heart or vessel  disease like slow heart rate, worsening heart failure, heart block, sick sinus syndrome or Raynaud's disease -kidney disease -liver disease -lung or breathing disease, like asthma or emphysema -pheochromocytoma -thyroid disease -an unusual or allergic reaction to metoprolol, other beta-blockers, medicines, foods, dyes, or preservatives -pregnant or trying to get pregnant -breast-feeding How should I use this medicine? Take this medicine by mouth with a glass of water. Follow the directions on the prescription label. Do not crush or chew. Take this medicine with or immediately after meals. Take your doses at regular intervals. Do not take more medicine than directed. Do not stop taking this medicine suddenly. This could lead to serious heart-related effects. Talk to your pediatrician regarding the use of this medicine in children. While this drug may be prescribed for children as young as 6 years for selected conditions, precautions do apply. Overdosage: If you think you have taken too much of this medicine contact a poison control center or emergency room at once. NOTE: This medicine is only for you. Do not share this medicine with others. What if I miss a dose? If you miss a dose, take it as soon as you can. If it is almost time for your next dose, take only that dose. Do not take double or extra doses. What may interact with this medicine? This medicine may interact with the following medications: -certain medicines for blood pressure, heart disease, irregular heart beat -certain medicines for depression, like monoamine oxidase (MAO) inhibitors, fluoxetine, or paroxetine -clonidine -dobutamine -epinephrine -isoproterenol -reserpine This list may not describe all possible interactions. Give your health care provider a list of all the medicines, herbs, non-prescription drugs, or dietary supplements you use. Also tell them if you smoke, drink alcohol, or use illegal drugs. Some items may  interact with your medicine. What should I watch for while using this medicine? Visit your doctor or health care professional for regular check ups. Contact your doctor right away if your symptoms worsen. Check your blood pressure and pulse rate regularly. Ask your health care professional what your blood pressure and pulse rate should be, and when you should contact them. You may get drowsy or dizzy. Do not drive, use machinery, or do anything that needs mental alertness until you know how this medicine affects you. Do not sit or stand up quickly, especially if you are an older patient. This reduces the risk of dizzy or fainting spells. Contact your doctor if these symptoms continue. Alcohol may interfere with the effect of this medicine. Avoid alcoholic drinks. What side effects may I notice from receiving this medicine? Side effects that you should report to your doctor or health care professional as soon as possible: -allergic reactions like skin rash, itching or hives -cold or numb hands or feet -depression -difficulty breathing -faint -fever with sore throat -irregular heartbeat, chest pain -rapid weight gain -swollen legs or ankles Side effects that usually do not require medical attention (report to your doctor or health care professional if they continue or are bothersome): -anxiety  or nervousness -change in sex drive or performance -dry skin -headache -nightmares or trouble sleeping -short term memory loss -stomach upset or diarrhea -unusually tired This list may not describe all possible side effects. Call your doctor for medical advice about side effects. You may report side effects to FDA at 1-800-FDA-1088. Where should I keep my medicine? Keep out of the reach of children. Store at room temperature between 15 and 30 degrees C (59 and 86 degrees F). Throw away any unused medicine after the expiration date. NOTE: This sheet is a summary. It may not cover all possible  information. If you have questions about this medicine, talk to your doctor, pharmacist, or health care provider.    2016, Elsevier/Gold Standard. (2013-04-09 14:41:37)

## 2016-03-07 MED FILL — Verapamil HCl IV Soln 2.5 MG/ML: INTRAVENOUS | Qty: 2 | Status: AC

## 2016-03-11 ENCOUNTER — Telehealth: Payer: Self-pay | Admitting: Cardiovascular Disease

## 2016-03-11 ENCOUNTER — Telehealth: Payer: Self-pay

## 2016-03-11 ENCOUNTER — Encounter (INDEPENDENT_AMBULATORY_CARE_PROVIDER_SITE_OTHER): Payer: Self-pay

## 2016-03-11 ENCOUNTER — Ambulatory Visit (INDEPENDENT_AMBULATORY_CARE_PROVIDER_SITE_OTHER): Payer: BLUE CROSS/BLUE SHIELD

## 2016-03-11 DIAGNOSIS — I493 Ventricular premature depolarization: Secondary | ICD-10-CM | POA: Diagnosis not present

## 2016-03-11 NOTE — Telephone Encounter (Signed)
Patient came for a holter monitor placement today and is c/o not being able to tolerate the Metoprolol. She has stopped taking the Metoprolol and would like to know what to do until her follow up.  She has a follow up appointment on Thursday, July 27th. She is not sure if she should stay off the metoprolol until then. Please contact the patient on instructions for the metoprolol.

## 2016-03-11 NOTE — Telephone Encounter (Signed)
I am using Metoprolol and it is making me feel bad. Heart Flutters terribly so I stopped using it. Please call.

## 2016-03-11 NOTE — Telephone Encounter (Signed)
Spoke w/ pt.  She reports that she took 2-3 doses of metoprolol and she feels that it caused her to feel nauseous & jittery.  She does not have HR to reports, does not know how to check.  Advised her that she was prescribed this for PVCs and that the med will help w/ these, not cause them.  She is willing to retry the metoprolol and will discuss further at her ov w/ Dr. Fletcher Anon on Thurs.

## 2016-03-11 NOTE — Telephone Encounter (Signed)
She can hold Metoprolol until her follow up.

## 2016-03-11 NOTE — Telephone Encounter (Signed)
Patient c/o feeling very jittery from the Metoprolol.

## 2016-03-12 NOTE — Telephone Encounter (Signed)
NA x 1; LMOM to call our office back regarding the metoprolol.

## 2016-03-14 ENCOUNTER — Encounter: Payer: Self-pay | Admitting: Cardiovascular Disease

## 2016-03-14 ENCOUNTER — Ambulatory Visit: Payer: Self-pay | Admitting: Cardiovascular Disease

## 2016-03-14 ENCOUNTER — Ambulatory Visit (INDEPENDENT_AMBULATORY_CARE_PROVIDER_SITE_OTHER): Payer: BLUE CROSS/BLUE SHIELD | Admitting: Cardiovascular Disease

## 2016-03-14 VITALS — BP 120/60 | HR 82 | Ht 66.0 in | Wt 188.0 lb

## 2016-03-14 DIAGNOSIS — I493 Ventricular premature depolarization: Secondary | ICD-10-CM | POA: Diagnosis not present

## 2016-03-14 DIAGNOSIS — R001 Bradycardia, unspecified: Secondary | ICD-10-CM

## 2016-03-14 DIAGNOSIS — R0602 Shortness of breath: Secondary | ICD-10-CM | POA: Diagnosis not present

## 2016-03-14 MED ORDER — SIMVASTATIN 20 MG PO TABS: 20.0000 mg | ORAL_TABLET | Freq: Every day | ORAL | 3 refills | Status: DC

## 2016-03-14 MED ORDER — DILTIAZEM HCL ER COATED BEADS 120 MG PO CP24: 120.0000 mg | ORAL_CAPSULE | Freq: Every day | ORAL | 3 refills | Status: DC

## 2016-03-14 NOTE — Telephone Encounter (Signed)
The patient was instructed to continue on the Metoprolol until her follow up appointment on 03/14/2016.

## 2016-03-14 NOTE — Progress Notes (Signed)
Cardiology Office Note   Date:  03/14/2016   ID:  Stacey Cline, DOB September 03, 1955, MRN [id number]  PCP:  Reginia Forts, MD  Cardiologist:   [name], MD   Chief Complaint  Patient presents with  . Other    Follow up from cardiac cath and needs to discuss coming off the metoprolol. Pt. did wear a holter monitor and brought it in today to be scanned. Meds reviewed by the patient verbally.       History of Present Illness: Stacey Cline is a 60 y.o. female who was seen recently for exertional dyspnea and ventricular bigeminy with frequent PVCs.  She has known history of treated hyperlipidemia and hypothyroidism. She is a lifelong nonsmoker. She does have family history of premature coronary artery disease.  Due to severity of her symptoms, I proceeded with a right and left cardiac catheterization which showed no evidence of pulmonary hypertension, normal cardiac output and borderline elevation of pulmonary wedge pressure at 14 mmHg. Coronary arteries showed only minimal irregularities with no evidence of obstructive disease. LV systolic function was normal. Due to frequent PVCs, she was started on small dose Toprol. However, she did not tolerate the medication very well due to extreme fatigue. Her symptoms are still the same.    Past Medical History:  Diagnosis Date  . Candidiasis of vulva and vagina   . Leiomyoma of uterus, unspecified   . Menopause age 16  . Pain in joint, multiple sites   . Pain in joint, shoulder region   . Papanicolaou smear of cervix with atypical squamous cells of undetermined significance (ASC-US)   . Postsurgical hypothyroidism   . Pure hypercholesterolemia   . Unspecified vitamin D deficiency   . Ventral hernia, unspecified, without mention of obstruction or gangrene     Past Surgical History:  Procedure Laterality Date  . BREAST BIOPSY  2006   Right  Benign  . CARDIAC CATHETERIZATION N/A 03/06/2016   Procedure: Right/Left Heart Cath  and Coronary Angiography;  Surgeon: Wellington Hampshire, MD;  Location: [facility];  Service: Cardiovascular;  Laterality: N/A;  . COSMETIC SURGERY     feet  . TONSILLECTOMY AND ADENOIDECTOMY  1960's  . TOTAL THYROIDECTOMY  2008     Current Outpatient Prescriptions  Medication Sig Dispense Refill  . Calcium Carbonate-Vitamin D (CALCIUM 500 + D) 500-125 MG-UNIT TABS Take 1 tablet by mouth daily. Reported on 11/28/2015    . levothyroxine (SYNTHROID, LEVOTHROID) 88 MCG tablet Take 88 mcg by mouth daily.  0  . metoprolol succinate (TOPROL-XL) 25 MG 24 hr tablet Take 1 tablet (25 mg total) by mouth daily. 30 tablet 3  . simvastatin (ZOCOR) 40 MG tablet Take 1 tablet (40 mg total) by mouth daily at 6 PM. 90 tablet 1   No current facility-administered medications for this visit.     Allergies:   Fish allergy; Isovue [iopamidol]; Penicillins; and Shellfish allergy    Social History:  The patient  reports that she has never smoked. She has never used smokeless tobacco. She reports that she does not drink alcohol or use drugs.   Family History:  The patient's family history includes Cancer (age of onset: 47) in her sister; Diabetes in her mother and sister; Heart disease in her father; Heart disease (age of onset: 14) in her mother; Hyperlipidemia in her mother; Hypertension in her brother and mother; Kidney failure in her mother; Lung disease in her father; Stroke in her father.    ROS:  Please see the history of present illness.   Otherwise, review of systems are positive for none.   All other systems are reviewed and negative.    PHYSICAL EXAM: VS:  BP 120/60 (BP Location: Left Arm, Patient Position: Sitting, Cuff Size: Normal)   Pulse 82   Ht 5\' 6"  (1.676 m)   Wt 188 lb (85.3 kg)   BMI 30.34 kg/m  , BMI Body mass index is 30.34 kg/m. GEN: Well nourished, well developed, in no acute distress  HEENT: normal  Neck: no JVD, carotid bruits, or masses Cardiac: RRR; no murmurs, rubs,  or gallops,no edema  Respiratory:  clear to auscultation bilaterally, normal work of breathing GI: soft, nontender, nondistended, + BS MS: no deformity or atrophy  Skin: warm and dry, no rash Neuro:  Strength and sensation are intact Psych: euthymic mood, full affect Right radial pulse is normal with no hematoma.the site of left antecubital vein is also intact.  EKG:  EKG is ordered today. The ekg ordered today demonstrates normal sinus rhythm with frequent PVCs some in the form of right ventricular bigeminy with nonspecific T wave changes.   Recent Labs: 11/28/2015: TSH 0.73 02/07/2016: ALT 25 03/05/2016: BUN 18; Creatinine, Ser 1.10; Hemoglobin 14.1; Platelets 158; Potassium 4.4; Sodium 142    Lipid Panel    Component Value Date/Time   CHOL 183 11/28/2015 0903   TRIG 134 11/28/2015 0903   HDL 49 11/28/2015 0903   CHOLHDL 3.7 11/28/2015 0903   VLDL 27 11/28/2015 0903   LDLCALC 107 11/28/2015 0903      Wt Readings from Last 3 Encounters:  03/14/16 188 lb (85.3 kg)  03/06/16 187 lb (84.8 kg)  03/05/16 187 lb 12.8 oz (85.2 kg)        ASSESSMENT AND PLAN:  1. Symptomatic frequent PVCs: Some in the form of ventricular bigeminy:  Ischemic heart disease and cardiomyopathy has been ruled out with recent cardiac catheterization. She did not tolerate treatment with small dose metoprolol. Thus, I switched her today to diltiazem extended release 120 mg once daily. I requested an echocardiogram to evaluate her right ventricle and ensure no other structural abnormalities. Holter monitor was done and will be reviewed to quantify her PVCs. If symptoms and burden of PVCs do not improve, I will consider EP consult.  2. Hyperlipidemia: Currently on simvastatin.  Disposition:   FU with me in 1 month.   Signed,  [name], MD  03/14/2016 10:41 AM    [place]

## 2016-03-14 NOTE — Telephone Encounter (Signed)
Patient was instructed to continue with the Metoprolol until her visit on 03/14/2016.

## 2016-03-14 NOTE — Patient Instructions (Addendum)
Medication Instructions:  Your physician has recommended you make the following change in your medication:  STOP taking metoprolol START taking diltiazem 120mg  once daily DECREASE simvastatin to 20mg  once daily   Labwork: none  Testing/Procedures: Your physician has requested that you have an echocardiogram. Echocardiography is a painless test that uses sound waves to create images of your heart. It provides your doctor with information about the size and shape of your heart and how well your heart's chambers and valves are working. This procedure takes approximately one hour. There are no restrictions for this procedure.    Follow-Up: Your physician recommends that you schedule a follow-up appointment in: one month with Dr. Fletcher Anon.    Any Other Special Instructions Will Be Listed Below (If Applicable).     If you need a refill on your cardiac medications before your next appointment, please call your pharmacy.  Echocardiogram An echocardiogram, or echocardiography, uses sound waves (ultrasound) to produce an image of your heart. The echocardiogram is simple, painless, obtained within a short period of time, and offers valuable information to your health care provider. The images from an echocardiogram can provide information such as:  Evidence of coronary artery disease (CAD).  Heart size.  Heart muscle function.  Heart valve function.  Aneurysm detection.  Evidence of a past heart attack.  Fluid buildup around the heart.  Heart muscle thickening.  Assess heart valve function. LET [facility] CARE PROVIDER KNOW ABOUT:  Any allergies you have.  All medicines you are taking, including vitamins, herbs, eye drops, creams, and over-the-counter medicines.  Previous problems you or members of your family have had with the use of anesthetics.  Any blood disorders you have.  Previous surgeries you have had.  Medical conditions you have.  Possibility of pregnancy, if  this applies. BEFORE THE PROCEDURE  No special preparation is needed. Eat and drink normally.  PROCEDURE   In order to produce an image of your heart, gel will be applied to your chest and a wand-like tool (transducer) will be moved over your chest. The gel will help transmit the sound waves from the transducer. The sound waves will harmlessly bounce off your heart to allow the heart images to be captured in real-time motion. These images will then be recorded.  You may need an IV to receive a medicine that improves the quality of the pictures. AFTER THE PROCEDURE You may return to your normal schedule including diet, activities, and medicines, unless your health care provider tells you otherwise.   This information is not intended to replace advice given to you by your health care provider. Make sure you discuss any questions you have with your health care provider.   Document Released: 08/02/2000 Document Revised: 08/26/2014 Document Reviewed: 04/12/2013 Elsevier Interactive Patient Education [employer].

## 2016-03-20 ENCOUNTER — Ambulatory Visit
Admission: RE | Admit: 2016-03-20 | Discharge: 2016-03-20 | Disposition: A | Discharge status: Home / Self Care | Payer: BLUE CROSS/BLUE SHIELD | Source: Ambulatory Visit | Attending: Cardiovascular Disease | Admitting: Cardiovascular Disease

## 2016-03-20 DIAGNOSIS — I493 Ventricular premature depolarization: Secondary | ICD-10-CM | POA: Diagnosis present

## 2016-04-01 ENCOUNTER — Other Ambulatory Visit: Payer: Self-pay

## 2016-04-01 ENCOUNTER — Ambulatory Visit (INDEPENDENT_AMBULATORY_CARE_PROVIDER_SITE_OTHER): Payer: BLUE CROSS/BLUE SHIELD

## 2016-04-01 DIAGNOSIS — I493 Ventricular premature depolarization: Secondary | ICD-10-CM

## 2016-04-01 DIAGNOSIS — R0602 Shortness of breath: Secondary | ICD-10-CM | POA: Diagnosis not present

## 2016-04-02 LAB — ECHOCARDIOGRAM COMPLETE
CHL CUP STROKE VOLUME: 27 mL
E/e' ratio: 7.77
EWDT: 194 ms
FS: 29 % (ref 28–44)
IV/PV OW: 0.81
LA ID, A-P, ES: 40 mm
LA diam index: 2.05 cm/m2
LA vol A4C: 96.1 ml
LA vol index: 44.2 mL/m2
LAVOL: 86.1 mL
LDCA: 3.14 cm2
LEFT ATRIUM END SYS DIAM: 40 mm
LV E/e' medial: 7.77
LV E/e'average: 7.77
LV PW d: 9.28 mm — AB (ref 0.6–1.1)
LV SIMPSON'S DISK: 50
LV TDI E'LATERAL: 12.1
LV dias vol index: 27 mL/m2
LV dias vol: 53 mL (ref 46–106)
LV e' LATERAL: 12.1 cm/s
LVOT SV: 64 mL
LVOT VTI: 20.3 cm
LVOT peak vel: 95 cm/s
LVOTD: 20 mm
LVSYSVOL: 27 mL (ref 14–42)
LVSYSVOLIN: 14 mL/m2
MV Dec: 194
MV Peak grad: 4 mmHg
MVPKAVEL: 44.1 m/s
MVPKEVEL: 94 m/s
PISA EROA: 0.04 cm2
RV TAPSE: 23.5 mm
RV sys press: 17 mmHg
Reg peak vel: 188 cm/s
TDI e' medial: 8.38
TR max vel: 188 cm/s
VTI: 203 cm

## 2016-04-04 ENCOUNTER — Ambulatory Visit (INDEPENDENT_AMBULATORY_CARE_PROVIDER_SITE_OTHER): Payer: BLUE CROSS/BLUE SHIELD | Admitting: Internal Medicine

## 2016-04-04 ENCOUNTER — Encounter: Payer: Self-pay | Admitting: Internal Medicine

## 2016-04-04 VITALS — BP 118/74 | HR 66 | Ht 66.0 in | Wt 187.2 lb

## 2016-04-04 DIAGNOSIS — I493 Ventricular premature depolarization: Secondary | ICD-10-CM

## 2016-04-04 MED ORDER — FLECAINIDE ACETATE 100 MG PO TABS: ORAL_TABLET | ORAL | Status: DC

## 2016-04-04 MED ORDER — FLECAINIDE ACETATE 100 MG PO TABS: ORAL_TABLET | ORAL | 6 refills | Status: DC

## 2016-04-04 NOTE — Progress Notes (Signed)
ELECTROPHYSIOLOGY CONSULT NOTE  Patient ID: Stacey Cline, MRN: [id number], DOB/AGE: 22-May-1956 60 y.o. Admit date: (Not on file) Date of Consult: 04/04/2016  Primary Physician: Reginia Forts, MD Primary Cardiologist: MA Consulting Physician MA  Chief Complaint: PVCs   HPI Stacey Cline is a 60 y.o. 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  PVC morphology is quite unusual with a multifaceted component in lead V1 the terminal portions of which are negative suggesting a more left bundle branch block configuration the axis is inferior and largely upright in leads aVR and aVL  Holter monitor 7/17 demonstrated 41% PVCs  She initially noted impairment of exercise tolerance in April. This became increasingly problematic over months. She has scant palpitations. She has had no edema. She has no syncope. There is no family history of palpitations. She uses no stimulants.  Efforts tp suppress them with metoprolol failed because of fatigue She was then started on diltiazem and felt much much better until just this last week when there is been recurrence of symptoms.  7/17 LHC normal coronary arteries, normal LV function 8/17 echocardiogram EF 50-55% left atrial enlargement (40/1.9/37) right atrium is described as normal   Past Medical History:  Diagnosis Date  . Candidiasis of vulva and vagina   . Leiomyoma of uterus, unspecified   . Menopause age 45  . Pain in joint, multiple sites   . Pain in joint, shoulder region   . Papanicolaou smear of cervix with atypical squamous cells of undetermined significance (ASC-US)   . Postsurgical hypothyroidism   . Pure hypercholesterolemia   . Unspecified vitamin D deficiency   . Ventral hernia, unspecified, without mention of obstruction or gangrene       Surgical History:  Past Surgical History:  Procedure Laterality Date  . BREAST BIOPSY  2006   Right  Benign  . CARDIAC  CATHETERIZATION N/A 03/06/2016   Procedure: Right/Left Heart Cath and Coronary Angiography;  Surgeon: Wellington Hampshire, MD;  Location: [place] [facility] [facility];  Service: Cardiovascular;  Laterality: N/A;  . COSMETIC SURGERY     feet  . TONSILLECTOMY AND ADENOIDECTOMY  1960's  . TOTAL THYROIDECTOMY  2008     Home Meds: Prior to Admission medications   Medication Sig Start Date End Date Taking? Authorizing Provider  Calcium Carbonate-Vitamin D (CALCIUM 500 + D) 500-125 MG-UNIT TABS Take 1 tablet by mouth daily. Reported on 11/28/2015   Yes Historical Provider, MD  diltiazem (CARDIZEM CD) 120 MG 24 hr capsule Take 1 capsule (120 mg total) by mouth daily. 03/14/16 04/13/16 Yes Wellington Hampshire, MD  levothyroxine (SYNTHROID, LEVOTHROID) 88 MCG tablet Take 88 mcg by mouth daily. 01/02/16  Yes Historical Provider, MD  simvastatin (ZOCOR) 20 MG tablet Take 1 tablet (20 mg total) by mouth daily at 6 PM. 03/14/16  Yes Wellington Hampshire, MD    Allergies:  Allergies  Allergen Reactions  . Fish Allergy Hives and Itching  . Isovue [Iopamidol] Hives    Pt developed 1 hive on her chest and 2 on her neck area appx 10 minutes post contrast injection. Given 50 mg oral benadryl.  Needs 13 hour pre meds in the future.   Marland Kitchen Penicillins Rash    Has patient had a PCN reaction causing immediate rash, facial/tongue/throat swelling, SOB or lightheadedness with hypotension: Yes Has patient had a PCN reaction causing severe rash involving mucus membranes or skin necrosis: No Has patient had a PCN reaction that required hospitalization  No Has patient had a PCN reaction occurring within the last 10 years: Yes If all of the above answers are "NO", then may proceed with Cephalosporin use.   . Shellfish Allergy Hives and Rash    Social History   Social History  . Marital status: Married    Spouse name: N/A  . Number of children: 0  . Years of education: college   Occupational History  . [employer]   Social  History Main Topics  . Smoking status: Never Smoker  . Smokeless tobacco: Never Used  . Alcohol use No  . Drug use: No  . Sexual activity: Yes   Other Topics Concern  . Not on file   Social History Narrative   Married x 15 years; Happily, no abuse. Husband is a Theme park manager.       Children: none      Lives: with husband.      Employment:  Retire 06/2012 from [employer] after 35 years; wants to volunteer.      Tobacco:        Alcohol:   Caffeine use: Moderate amount.       Exercise: gym three days per week.          Family History  Problem Relation Age of Onset  . Heart disease Father     CAD/3  AMI in 21's   . Stroke Father     several  . Lung disease Father   . Hypertension Mother   . Diabetes Mother   . Heart disease Mother 72    AMI several; first unsure age.  Pacemaker  . Hyperlipidemia Mother   . Kidney failure Mother   . Diabetes Sister   . Cancer Sister 60    Breast cancer  . Hypertension Brother   . Breast cancer    . Hypertension    . Diabetes type II       ROS:  Please see the history of present illness.     All other systems reviewed and negative.    Physical Exam: Blood pressure 118/74, pulse 66, height 5\' 6"  (1.676 m), weight 187 lb 4 oz (84.9 kg). General: Well developed, well nourished female in no acute distress. Head: Normocephalic, atraumatic, sclera non-icteric, no xanthomas, nares are without discharge. EENT: normal  Lymph Nodes:  none Neck: Negative for carotid bruits. JVD not elevated. Back:without scoliosis kyphosis Lungs: Clear bilaterally to auscultation without wheezes, rales, or rhonchi. Breathing is unlabored. Heart: Irregular RR with S1 S2.  2/6 systolic  murmur . No rubs, or gallops appreciated. Abdomen: Soft, non-tender, non-distended with normoactive bowel sounds. No hepatomegaly. No rebound/guarding. No obvious abdominal masses. Msk:  Strength and tone appear normal for age. Extremities: No clubbing or cyanosis. No  edema.  Distal pedal  pulses are 2+ and equal bilaterally. Skin: Warm and Dry Neuro: Alert and oriented X 3. CN III-XII intact Grossly normal sensory and motor function . Psych:  Responds to questions appropriately with a normal affect.      Labs: Cardiac Enzymes No results for input(s): CKTOTAL, CKMB, TROPONINI in the last 72 hours. CBC Lab Results  Component Value Date   WBC 7.1 03/05/2016   HGB 14.1 03/05/2016   HCT 41.5 03/05/2016   MCV 84.8 03/05/2016   PLT 158 03/05/2016   PROTIME: No results for input(s): LABPROT, INR in the last 72 hours. Chemistry No results for input(s): NA, K, CL, CO2, BUN, CREATININE, CALCIUM, PROT, BILITOT, ALKPHOS, ALT, AST, GLUCOSE in the last  168 hours.  Invalid input(s): LABALBU Lipids Lab Results  Component Value Date   CHOL 183 11/28/2015   HDL 49 11/28/2015   LDLCALC 107 11/28/2015   TRIG 134 11/28/2015   BNP No results found for: PROBNP Thyroid Function Tests: No results for input(s): TSH, T4TOTAL, T3FREE, THYROIDAB in the last 72 hours.  Invalid input(s): FREET3 Miscellaneous Lab Results  Component Value Date   DDIMER 0.26 02/07/2016    Radiology/Studies:  No results found.  EKG: Sinus with infrequent PVC   Assessment and Plan:  PVCs-unusual morphology superior axis and a largely nondescript QRS morphology in lead V1  Dyspnea on exertion  LV dysfunction-mild/minimal  Abnormal ECG    The patient has frequent PVCs. They have been quite symptomatic. There may also be some small impact on LV function with an EF of 50-55%. She was improved initially with diltiazem. We will add flecainide to that for rhythm control. We have discussed pro arrhythmia and anticipate treadmill testing in about 10 days. We'll start off on 50 mg twice daily and she is instructed increase the dose to 100 mg twice daily if the PVCs are not significantly suppressed.  Once we have some degree of suppression of PVCs, I would undertake cardiac MR given the unusual  morphology of the PVCs. Her ECG also suggests right atrial abnormalities and her echo describes left atrial abnormalities. This can be corroborated by MRI scanning  I have explained to her that her PVC morphology is unusual. I've also suggested though that in the context of cardiac evaluation to date the likelihood that this represents a potentially life-threatening condition is minimal. She is encouraged to resume activities as tolerated     Virl Axe

## 2016-04-04 NOTE — Patient Instructions (Addendum)
Medication Instructions: - Your physician has recommended you make the following change in your medication:  1) Start flecainide 100 mg take 1/2 tablet (50 mg) by mouth twice daily to start, if you are tolerating and symptoms are still persistent, then increase to 100 mg twice daily.  Labwork: - none  Procedures/Testing: - Your physician has requested that you have an exercise tolerance test (Tuesday 8/29- while Dr. Caryl Comes is in the office that [place] per [employer]man).   ** wear comfortable clothes and tennis shoes to the test **  Follow-Up: - pending treadmill results.  Any Additional Special Instructions Will Be Listed Below (If Applicable).     If you need a refill on your cardiac medications before your next appointment, please call your pharmacy.

## 2016-04-09 ENCOUNTER — Telehealth: Payer: Self-pay | Admitting: Internal Medicine

## 2016-04-09 NOTE — Telephone Encounter (Signed)
Patient would like a different day for her treadmill test.  Anything else available that's not too far out?

## 2016-04-09 NOTE — Telephone Encounter (Signed)
Pt requests to change 8/29 GXT to later date. She is agreeable w/ Aug 31 @ 11:30am. Dr. Caryl Comes will be in the office. Pt had no further questions.

## 2016-04-18 ENCOUNTER — Ambulatory Visit (INDEPENDENT_AMBULATORY_CARE_PROVIDER_SITE_OTHER): Payer: BLUE CROSS/BLUE SHIELD

## 2016-04-18 DIAGNOSIS — I493 Ventricular premature depolarization: Secondary | ICD-10-CM | POA: Diagnosis not present

## 2016-04-25 ENCOUNTER — Encounter: Payer: Self-pay | Admitting: Cardiovascular Disease

## 2016-04-25 ENCOUNTER — Other Ambulatory Visit
Admission: RE | Admit: 2016-04-25 | Discharge: 2016-04-25 | Disposition: A | Discharge status: Home / Self Care | Payer: BLUE CROSS/BLUE SHIELD | Source: Ambulatory Visit | Attending: Cardiovascular Disease | Admitting: Cardiovascular Disease

## 2016-04-25 ENCOUNTER — Ambulatory Visit (INDEPENDENT_AMBULATORY_CARE_PROVIDER_SITE_OTHER): Payer: BLUE CROSS/BLUE SHIELD | Admitting: Cardiovascular Disease

## 2016-04-25 VITALS — BP 118/68 | HR 64 | Ht 66.0 in | Wt 188.0 lb

## 2016-04-25 DIAGNOSIS — Z01812 Encounter for preprocedural laboratory examination: Secondary | ICD-10-CM

## 2016-04-25 DIAGNOSIS — I493 Ventricular premature depolarization: Secondary | ICD-10-CM

## 2016-04-25 DIAGNOSIS — E785 Hyperlipidemia, unspecified: Secondary | ICD-10-CM | POA: Diagnosis not present

## 2016-04-25 LAB — BASIC METABOLIC PANEL
Anion gap: 5 (ref 5–15)
BUN: 20 mg/dL (ref 6–20)
CHLORIDE: 107 mmol/L (ref 101–111)
CO2: 29 mmol/L (ref 22–32)
CREATININE: 1.12 mg/dL — AB (ref 0.44–1.00)
Calcium: 9.1 mg/dL (ref 8.9–10.3)
GFR calc Af Amer: 60 mL/min — ABNORMAL LOW (ref >60)
GFR, EST NON AFRICAN AMERICAN: 52 mL/min — AB (ref >60)
GLUCOSE: 78 mg/dL (ref 65–99)
POTASSIUM: 4.1 mmol/L (ref 3.5–5.1)
Sodium: 141 mmol/L (ref 135–145)

## 2016-04-25 NOTE — Patient Instructions (Addendum)
Medication Instructions:  Your physician recommends that you continue on your current medications as directed. Please refer to the Current Medication list given to you today.   Labwork: BMET at [facility]   Testing/Procedures: Your physician has recommended that you wear a holter monitor. Holter monitors are medical devices that record the heart's electrical activity. Doctors most often use these monitors to diagnose arrhythmias. Arrhythmias are problems with the speed or rhythm of the heartbeat. The monitor is a small, portable device. You can wear one while you do your normal daily activities. This is usually used to diagnose what is causing palpitations/syncope (passing out).  Date & Time: _______________________________  Your physician has requested that you have a cardiac MRI. Cardiac MRI uses a computer to create images of your heart as its beating, producing both still and moving pictures of your heart and major blood vessels. For further information please visit [email or web address]    [facility] [place] [id number]  Thursday, Sept 14 at 1:30pm  Come into the Main Entrance and check in at Radiology.      Follow-Up: Your physician recommends that you schedule a follow-up appointment with Dr. Caryl Comes after your tests.   Any Other Special Instructions Will Be Listed Below (If Applicable).     If you need a refill on your cardiac medications before your next appointment, please call your pharmacy.   Cardiac Event Monitoring A cardiac event monitor is a small recording device used to help detect abnormal heart rhythms (arrhythmias). The monitor is used to record heart rhythm when noticeable symptoms such as the following occur:  Fast heartbeats (palpitations), such as heart racing or fluttering.  Dizziness.  Fainting or light-headedness.  Unexplained weakness. The monitor is wired to two electrodes placed on your chest. Electrodes are  flat, sticky disks that attach to your skin. The monitor can be worn for up to 30 days. You will wear the monitor at all times, except when bathing.  HOW TO USE YOUR CARDIAC EVENT MONITOR A technician will prepare your chest for the electrode placement. The technician will show you how to place the electrodes, how to work the monitor, and how to replace the batteries. Take time to practice using the monitor before you leave the office. Make sure you understand how to send the information from the monitor to your health care provider. This requires a telephone with a landline, not a cell phone. You need to:  Wear your monitor at all times, except when you are in water:  Do not get the monitor wet.  Take the monitor off when bathing. Do not swim or use a hot tub with it on.  Keep your skin clean. Do not put body lotion or moisturizer on your chest.  Change the electrodes daily or any time they stop sticking to your skin. You might need to use tape to keep them on.  It is possible that your skin under the electrodes could become irritated. To keep this from happening, try to put the electrodes in slightly different places on your chest. However, they must remain in the area under your left breast and in the upper right section of your chest.  Make sure the monitor is safely clipped to your clothing or in a location close to your body that your health care provider recommends.  Press the button to record when you feel symptoms of heart trouble, such as dizziness, weakness, light-headedness, palpitations, thumping, shortness of breath, unexplained weakness, or a fluttering  or racing heart. The monitor is always on and records what happened slightly before you pressed the button, so do not worry about being too late to get good information.  Keep a diary of your activities, such as walking, doing chores, and taking medicine. It is especially important to note what you were doing when you pushed the  button to record your symptoms. This will help your health care provider determine what might be contributing to your symptoms. The information stored in your monitor will be reviewed by your health care provider alongside your diary entries.  Send the recorded information as recommended by your health care provider. It is important to understand that it will take some time for your health care provider to process the results.  Change the batteries as recommended by your health care provider. SEEK IMMEDIATE MEDICAL CARE IF:   You have chest pain.  You have extreme difficulty breathing or shortness of breath.  You develop a very fast heartbeat that persists.  You develop dizziness that does not go away.  You faint or constantly feel you are about to faint.   This information is not intended to replace advice given to you by your health care provider. Make sure you discuss any questions you have with your health care provider.   Document Released: 05/14/2008 Document Revised: 08/26/2014 Document Reviewed: 02/01/2013 Elsevier Interactive Patient Education [employer].

## 2016-04-25 NOTE — Progress Notes (Signed)
Cardiology Office Note   Date:  04/25/2016   ID:  Stacey Cline, DOB [birth year], MRN [id number]  PCP:  Reginia Forts, MD  Cardiologist:   [name], MD   Chief Complaint  Patient presents with  . Other    1 month f/u c/o pvc. Meds verbally reviewed with pt.       History of Present Illness: Stacey Cline is a 60 y.o. female who is here today for a follow-up visit regarding excessive symptomatic PVCs.   She has known history of treated hyperlipidemia and hypothyroidism. She is a lifelong nonsmoker. She does have family history of premature coronary artery disease.  Right and left cardiac catheterization in July showed no evidence of pulmonary hypertension, normal cardiac output and borderline elevation of pulmonary wedge pressure at 14 mmHg. Coronary arteries showed only minimal irregularities with no evidence of obstructive disease. LV systolic function was normal.   Echocardiogram showed an ejection fraction of 50-55%, mild to moderate mitral regurgitation and moderately dilated left atrium.   Holter monitor showed 46,000 PVCs in 48 hours to presenting 41% burden.   she was seen by Dr. Caryl Comes and was started on small dose flecainide with significant improvement in symptoms. She feels the palpitations now mainly in the morning after she wakes up but very minimal symptoms throughout the day. She underwent a treadmill stress test which showed no evidence of arrhythmia. Her fatigue is improving.   Past Medical History:  Diagnosis Date  . Candidiasis of vulva and vagina   . Leiomyoma of uterus, unspecified   . Menopause age 22  . Pain in joint, multiple sites   . Pain in joint, shoulder region   . Papanicolaou smear of cervix with atypical squamous cells of undetermined significance (ASC-US)   . Postsurgical hypothyroidism   . Pure hypercholesterolemia   . Unspecified vitamin D deficiency   . Ventral hernia, unspecified, without mention of obstruction or gangrene      Past Surgical History:  Procedure Laterality Date  . BREAST BIOPSY  2006   Right  Benign  . CARDIAC CATHETERIZATION N/A 03/06/2016   Procedure: Right/Left Heart Cath and Coronary Angiography;  Surgeon: Wellington Hampshire, MD;  Location: [place] [facility];  Service: Cardiovascular;  Laterality: N/A;  . COSMETIC SURGERY     feet  . TONSILLECTOMY AND ADENOIDECTOMY  1960's  . TOTAL THYROIDECTOMY  2008     Current Outpatient Prescriptions  Medication Sig Dispense Refill  . Calcium Carbonate-Vitamin D (CALCIUM 500 + D) 500-125 MG-UNIT TABS Take 1 tablet by mouth daily. Reported on 11/28/2015    . diltiazem (CARDIZEM CD) 120 MG 24 hr capsule Take 1 capsule (120 mg total) by mouth daily. 30 capsule 3  . flecainide (TAMBOCOR) 100 MG tablet Take one tablet (100 mg) by mouth twice daily as directed 60 tablet 6  . levothyroxine (SYNTHROID, LEVOTHROID) 88 MCG tablet Take 88 mcg by mouth daily.  0  . simvastatin (ZOCOR) 20 MG tablet Take 1 tablet (20 mg total) by mouth daily at 6 PM. 30 tablet 3   No current facility-administered medications for this visit.     Allergies:   Fish allergy; Isovue [iopamidol]; Penicillins; and Shellfish allergy    Social History:  The patient  reports that she has never smoked. She has never used smokeless tobacco. She reports that she does not drink alcohol or use drugs.   Family History:  The patient's family history includes Cancer (age of onset: 71) in her sister; Diabetes  in her mother and sister; Heart disease in her father; Heart disease (age of onset: 65) in her mother; Hyperlipidemia in her mother; Hypertension in her brother and mother; Kidney failure in her mother; Lung disease in her father; Stroke in her father.    ROS:  Please see the history of present illness.   Otherwise, review of systems are positive for none.   All other systems are reviewed and negative.    PHYSICAL EXAM: VS:  BP 118/68 (BP Location: Left Arm, Patient Position: Sitting,  Cuff Size: Normal)   Pulse 64   Ht 5\' 6"  (1.676 m)   Wt 188 lb (85.3 kg)   BMI 30.34 kg/m  , BMI Body mass index is 30.34 kg/m. GEN: Well nourished, well developed, in no acute distress  HEENT: normal  Neck: no JVD, carotid bruits, or masses Cardiac: RRR; no murmurs, rubs, or gallops,no edema  Respiratory:  clear to auscultation bilaterally, normal work of breathing GI: soft, nontender, nondistended, + BS MS: no deformity or atrophy  Skin: warm and dry, no rash Neuro:  Strength and sensation are intact Psych: euthymic mood, full affect Right radial pulse is normal with no hematoma.the site of left antecubital vein is also intact.  EKG:  EKG is ordered today. The ekg ordered today demonstrates normal sinus rhythm with  no PVCs. Right atrial enlargement  Recent Labs: 11/28/2015: TSH 0.73 02/07/2016: ALT 25 03/05/2016: BUN 18; Creatinine, Ser 1.10; Hemoglobin 14.1; Platelets 158; Potassium 4.4; Sodium 142    Lipid Panel    Component Value Date/Time   CHOL 183 11/28/2015 0903   TRIG 134 11/28/2015 0903   HDL 49 11/28/2015 0903   CHOLHDL 3.7 11/28/2015 0903   VLDL 27 11/28/2015 0903   LDLCALC 107 11/28/2015 0903      Wt Readings from Last 3 Encounters:  04/25/16 188 lb (85.3 kg)  04/04/16 187 lb 4 oz (84.9 kg)  03/14/16 188 lb (85.3 kg)        ASSESSMENT AND PLAN:  1. Symptomatic frequent PVCs: Significant improvement on current regimen of flecainide 50 mg twice daily and diltiazem extended release 120 mg once daily. I discussed the case with Dr. Caryl Comes and the recommendation is to proceed with a cardiac MRI to ensure that she does not have structural heart disease that might be responsible for these frequent PVCs. We have to exclude infiltrative heart disease such as sarcoidosis. We also requested a 24-hour Holter monitor to ensure improvement in the burden of PVCs. Ejection fraction was 50-55% on echocardiogram and would be evaluated with a cardiac MRI.  2.  Hyperlipidemia: Currently on simvastatin.  Disposition:   FU with Dr. Caryl Comes after cardiac MRI and Holter monitor. She can follow-up with me as needed.  Signed,  [name], MD  04/25/2016 10:08 AM    [place]

## 2016-04-26 LAB — EXERCISE TOLERANCE TEST
CHL CUP MPHR: 158 {beats}/min
CSEPED: 7 min
CSEPEW: 9.7 METS
CSEPHR: 91 %
Exercise duration (sec): 45 s
Peak HR: 144 {beats}/min
Rest HR: 76 {beats}/min

## 2016-04-30 ENCOUNTER — Telehealth: Payer: Self-pay | Admitting: Cardiovascular Disease

## 2016-04-30 ENCOUNTER — Ambulatory Visit (INDEPENDENT_AMBULATORY_CARE_PROVIDER_SITE_OTHER): Payer: BLUE CROSS/BLUE SHIELD

## 2016-04-30 DIAGNOSIS — I493 Ventricular premature depolarization: Secondary | ICD-10-CM | POA: Diagnosis not present

## 2016-04-30 NOTE — Telephone Encounter (Signed)
Dr. Fletcher Anon agreeable to pt taking benadryl prior to cardiac MRI. Pt verbalized understanding with no further questions.

## 2016-04-30 NOTE — Telephone Encounter (Signed)
Pt in office today for labs. Expressed concern regarding contrast to be used for Thursday's cardiac MRI as she has an allergy to isovue. She developed hives during a chest MRI. Took benadryl and sx relieved. Per Willette Cluster, [place] cardiac MRI, this is not the same contrast as used for CAT scan. Gadolineum contrast used for cardiac MRI.  Will review with Dr. Fletcher Anon and advise.

## 2016-05-02 ENCOUNTER — Other Ambulatory Visit: Payer: Self-pay

## 2016-05-02 ENCOUNTER — Ambulatory Visit (HOSPITAL_COMMUNITY)
Admission: RE | Admit: 2016-05-02 | Discharge: 2016-05-02 | Disposition: A | Discharge status: Home / Self Care | Payer: BLUE CROSS/BLUE SHIELD | Source: Ambulatory Visit | Attending: Cardiovascular Disease | Admitting: Cardiovascular Disease

## 2016-05-02 DIAGNOSIS — I493 Ventricular premature depolarization: Secondary | ICD-10-CM | POA: Insufficient documentation

## 2016-05-02 MED ORDER — GADOBENATE DIMEGLUMINE 529 MG/ML IV SOLN
28.0000 mL | Freq: Once | INTRAVENOUS | Status: AC | PRN
  Administered 2016-05-02: 28 mL via INTRAVENOUS

## 2016-05-02 MED ORDER — DILTIAZEM HCL ER COATED BEADS 120 MG PO CP24: 120.0000 mg | ORAL_CAPSULE | Freq: Every day | ORAL | 6 refills | Status: DC

## 2016-05-03 ENCOUNTER — Other Ambulatory Visit: Payer: Self-pay | Admitting: *Deleted

## 2016-05-03 MED ORDER — DILTIAZEM HCL ER COATED BEADS 120 MG PO CP24: 120.0000 mg | ORAL_CAPSULE | Freq: Every day | ORAL | 3 refills | Status: DC

## 2016-05-09 ENCOUNTER — Other Ambulatory Visit: Payer: Self-pay

## 2016-05-09 MED ORDER — SIMVASTATIN 20 MG PO TABS: 20.0000 mg | ORAL_TABLET | Freq: Every day | ORAL | 3 refills | Status: DC

## 2016-05-14 ENCOUNTER — Ambulatory Visit
Admission: RE | Admit: 2016-05-14 | Discharge: 2016-05-14 | Disposition: A | Discharge status: Home / Self Care | Payer: BLUE CROSS/BLUE SHIELD | Source: Ambulatory Visit | Attending: Cardiovascular Disease | Admitting: Cardiovascular Disease

## 2016-05-14 DIAGNOSIS — I493 Ventricular premature depolarization: Secondary | ICD-10-CM | POA: Diagnosis present

## 2016-05-30 ENCOUNTER — Encounter: Payer: Self-pay | Admitting: Internal Medicine

## 2016-05-30 ENCOUNTER — Ambulatory Visit (INDEPENDENT_AMBULATORY_CARE_PROVIDER_SITE_OTHER): Payer: BLUE CROSS/BLUE SHIELD | Admitting: Internal Medicine

## 2016-05-30 VITALS — BP 130/64 | HR 50 | Ht 66.0 in | Wt 188.1 lb

## 2016-05-30 DIAGNOSIS — R0609 Other forms of dyspnea: Secondary | ICD-10-CM

## 2016-05-30 DIAGNOSIS — I493 Ventricular premature depolarization: Secondary | ICD-10-CM | POA: Diagnosis not present

## 2016-05-30 DIAGNOSIS — R0600 Dyspnea, unspecified: Secondary | ICD-10-CM

## 2016-05-30 DIAGNOSIS — I519 Heart disease, unspecified: Secondary | ICD-10-CM

## 2016-05-30 MED ORDER — FLECAINIDE ACETATE 100 MG PO TABS: ORAL_TABLET | ORAL | Status: DC

## 2016-05-30 NOTE — Progress Notes (Signed)
Patient Care Team: Wardell Honour, MD as PCP - General (Family Medicine)   HPI  Stacey Cline is a 60 y.o. 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 There have a somewhat unusual morphology with nondescript QRS in V1 and a superior axis.   There were frequent couplets and short runs on Holter monitoring. Burden was 41%.  They have persisted Despite calcium blockers. At the last visit we started her on flecainide.    Records and Results Reviewed  she saw Dr. Gaylyn Cheers in the interim. She is feeling significantly improved.  Repeat Holter monitor however demonstrated 37% PVCs  DATE TEST    8/17    Echo   EF 55 %   MR  Mild mod  9/17    MRI   EF normal     Normal without DE MR moderateHe of course    She has flet much Much better but still has some limitations. She continues to feel palpitations abdominally in the morning. On one occasion she s missed a dose and then later on to 2 doses. She felt significant palpitations around this juncture and wonders whether was related to the higher dose.  Past Medical History:  Diagnosis Date  . Candidiasis of vulva and vagina   . Leiomyoma of uterus, unspecified   . Menopause age 43  . Pain in joint, multiple sites   . Pain in joint, shoulder region   . Papanicolaou smear of cervix with atypical squamous cells of undetermined significance (ASC-US)   . Postsurgical hypothyroidism   . Pure hypercholesterolemia   . Unspecified vitamin D deficiency   . Ventral hernia, unspecified, without mention of obstruction or gangrene     Past Surgical History:  Procedure Laterality Date  . BREAST BIOPSY  2006   Right  Benign  . CARDIAC CATHETERIZATION N/A 03/06/2016   Procedure: Right/Left Heart Cath and Coronary Angiography;  Surgeon: Wellington Hampshire, MD;  Location: [facility];  Service: Cardiovascular;  Laterality: N/A;  . COSMETIC SURGERY     feet  . TONSILLECTOMY AND ADENOIDECTOMY  1960's  .  TOTAL THYROIDECTOMY  2008    Current Outpatient Prescriptions  Medication Sig Dispense Refill  . Calcium Carbonate-Vitamin D (CALCIUM 500 + D) 500-125 MG-UNIT TABS Take 1 tablet by mouth daily. Reported on 11/28/2015    . diltiazem (CARDIZEM CD) 120 MG 24 hr capsule Take 1 capsule (120 mg total) by mouth daily. 90 capsule 3  . flecainide (TAMBOCOR) 100 MG tablet Take one tablet (100 mg) by mouth twice daily as directed 60 tablet 6  . levothyroxine (SYNTHROID, LEVOTHROID) 88 MCG tablet Take 88 mcg by mouth daily.  0  . simvastatin (ZOCOR) 20 MG tablet Take 1 tablet (20 mg total) by mouth daily at 6 PM. 90 tablet 3   No current facility-administered medications for this visit.     Allergies  Allergen Reactions  . Fish Allergy Hives and Itching  . Isovue [Iopamidol] Hives    Pt developed 1 hive on her chest and 2 on her neck area appx 10 minutes post contrast injection. Given 50 mg oral benadryl.  Needs 13 hour pre meds in the future.   Marland Kitchen Penicillins Rash    Has patient had a PCN reaction causing immediate rash, facial/tongue/throat swelling, SOB or lightheadedness with hypotension: Yes Has patient had a PCN reaction causing severe rash involving mucus membranes or skin necrosis: No Has patient had a PCN  reaction that required hospitalization No Has patient had a PCN reaction occurring within the last 10 years: Yes If all of the above answers are "NO", then may proceed with Cephalosporin use.   . Shellfish Allergy Hives and Rash      Review of Systems negative except from HPI and PMH  Physical Exam BP 130/64 (BP Location: Left Arm, Patient Position: Sitting, Cuff Size: Normal)   Pulse (!) 50   Ht 5\' 6"  (1.676 m)   Wt 188 lb 1.9 oz (85.3 kg)   BMI 30.36 kg/m  Well developed and well nourished in no acute distress HENT normal E scleral and icterus clear Neck Supple JVP flat; carotids brisk and full  Regular rate and rhythm, no murmurs gallops or rub Soft with active bowel  sounds No clubbing cyanosis Repleted was then switched today Edema Alert and oriented, grossly normal motor and sensory function Skin Warm and Dry    Assessment and  Plan  PVCs-unusual morphology superior axis and a largely nondescript QRS morphology in lead V1  Dyspnea on exertion  LV dysfunction-mild/minimal  Abnormal ECG  She is much improved despite persitent high burden of PVCs which makes me think that the symptoms were related to the beta blockers  Her holter continues to demonstrate 30+% PVCs     We reviewed the MRI together as the patient and her husband are concerned about the cause of her PVCs. 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 Hence, given her ongoing symptoms of exercise intolerance and her mild LV dysfunction further therapy to suppress the PVCs is appropriate. We broached the subject of ablation; she is not inclined at this juncture to want to consider that.  We will increase her flecainide from 50--100 mg twice daily with intermittent 150 mg a day in divided doses. I have reviewed with her how to take her pulse and I have also given her the websites for AliveCor both operative stools as to how she can assess the burden of her PVCs.  If we can suppress them, we will hold her her to clarify degree of suppression and if we have accomplished this then consider echocardiogram. If we cannot suppress them with the current drugs, alternative drug therapy and/or ablation will be considered at her next visit  More than 50% of 45 min was spent in counseling related to the above     Current medicines are reviewed at length with the patient today .  The patient does not  have concerns regarding medicines.

## 2016-05-30 NOTE — Patient Instructions (Signed)
Medication Instructions: - Your physician has recommended you make the following change in your medication:  1) take flecainide 1/2 tablet (50 mg) three times a day x 1 week, then 2) take flecainide one tablet (100 mg) twice daily  Labwork: - none ordered  Procedures/Testing: - none ordered  Follow-Up: - Your physician recommends that you schedule a follow-up appointment in: 4-5 weeks with Dr. Caryl Comes.  Any Additional Special Instructions Will Be Listed Below (If Applicable). ** Alive Cor monitor **     If you need a refill on your cardiac medications before your next appointment, please call your pharmacy.

## 2016-06-11 ENCOUNTER — Encounter: Payer: Self-pay | Admitting: Family Medicine

## 2016-06-11 ENCOUNTER — Ambulatory Visit (INDEPENDENT_AMBULATORY_CARE_PROVIDER_SITE_OTHER): Payer: BLUE CROSS/BLUE SHIELD | Admitting: Family Medicine

## 2016-06-11 ENCOUNTER — Other Ambulatory Visit: Payer: Self-pay | Admitting: Family Medicine

## 2016-06-11 VITALS — BP 122/72 | HR 51 | Temp 98.2°F | Resp 18 | Ht 66.0 in | Wt 188.6 lb

## 2016-06-11 DIAGNOSIS — R0609 Other forms of dyspnea: Secondary | ICD-10-CM

## 2016-06-11 DIAGNOSIS — E034 Atrophy of thyroid (acquired): Secondary | ICD-10-CM

## 2016-06-11 DIAGNOSIS — R0600 Dyspnea, unspecified: Secondary | ICD-10-CM

## 2016-06-11 DIAGNOSIS — R7302 Impaired glucose tolerance (oral): Secondary | ICD-10-CM

## 2016-06-11 DIAGNOSIS — R21 Rash and other nonspecific skin eruption: Secondary | ICD-10-CM

## 2016-06-11 DIAGNOSIS — Z114 Encounter for screening for human immunodeficiency virus [HIV]: Secondary | ICD-10-CM

## 2016-06-11 DIAGNOSIS — Z1159 Encounter for screening for other viral diseases: Secondary | ICD-10-CM

## 2016-06-11 DIAGNOSIS — I493 Ventricular premature depolarization: Secondary | ICD-10-CM | POA: Diagnosis not present

## 2016-06-11 DIAGNOSIS — E7800 Pure hypercholesterolemia, unspecified: Secondary | ICD-10-CM

## 2016-06-11 MED ORDER — KETOCONAZOLE 2 % EX CREA: 1.0000 "application " | TOPICAL_CREAM | Freq: Two times a day (BID) | CUTANEOUS | 0 refills | Status: DC

## 2016-06-11 MED ORDER — SIMVASTATIN 20 MG PO TABS: 20.0000 mg | ORAL_TABLET | Freq: Every day | ORAL | 3 refills | Status: DC

## 2016-06-11 NOTE — Patient Instructions (Addendum)
IF you received an x-ray today, you will receive an invoice from [facility]. Please contact [facility] at [phone] with questions or concerns regarding your invoice.   IF you received labwork today, you will receive an invoice from [employer]. Please contact [facility] at [phone] with questions or concerns regarding your invoice.   Our billing staff will not be able to assist you with questions regarding bills from these companies.  You will be contacted with the lab results as soon as they are available. The fastest way to get your results is to activate your My Chart account. Instructions are located on the last page of this paperwork. If you have not heard from [place] regarding the results in 2 weeks, please contact this office.      High Cholesterol High cholesterol refers to having a high level of cholesterol in your blood. Cholesterol is a white, waxy, fat-like protein that your body needs in small amounts. Your liver makes all the cholesterol you need. Excess cholesterol comes from the food you eat. Cholesterol travels in your bloodstream through your blood vessels. If you have high cholesterol, deposits (plaque) may build up on the walls of your blood vessels. This makes the arteries narrower and stiffer. Plaque increases your risk of heart attack and stroke. Work with your health care provider to keep your cholesterol levels in a healthy range. RISK FACTORS Several things can make you more likely to have high cholesterol. These include:   Eating foods high in animal fat (saturated fat) or cholesterol.  Being overweight.  Not getting enough exercise.  Having a family history of high cholesterol. SIGNS AND SYMPTOMS High cholesterol does not cause symptoms. DIAGNOSIS  Your health care provider can do a blood test to check whether you have high cholesterol. If you are older than 20, your health care provider may check your  cholesterol every 4-6 years. You may be checked more often if you already have high cholesterol or other risk factors for heart disease. The blood test for cholesterol measures the following:  Bad cholesterol (LDL cholesterol). This is the type of cholesterol that causes heart disease. This number should be less than 100.  Good cholesterol (HDL cholesterol). This type helps protect against heart disease. A healthy level of HDL cholesterol is 60 or higher.  Total cholesterol. This is the combined number of LDL cholesterol and HDL cholesterol. A healthy number is less than 200. TREATMENT  High cholesterol can be treated with diet changes, lifestyle changes, and medicine.   Diet changes may include eating more whole grains, fruits, vegetables, nuts, and fish. You may also have to cut back on red meat and foods with a lot of added sugar.  Lifestyle changes may include getting at least 40 minutes of aerobic exercise three times a week. Aerobic exercises include walking, biking, and swimming. Aerobic exercise along with a healthy diet can help you maintain a healthy weight. Lifestyle changes may also include quitting smoking.  If diet and lifestyle changes are not enough to lower your cholesterol, your health care provider may prescribe a statin medicine. This medicine has been shown to lower cholesterol and also lower the risk of heart disease. HOME CARE INSTRUCTIONS  Only take over-the-counter or prescription medicines as directed by your health care provider.   Follow a healthy diet as directed by your health care provider. For instance:   Eat chicken (without skin), fish, veal, shellfish, ground [place] breast, and round or loin cuts  of red meat.  Do not eat fried foods and fatty meats, such as hot dogs and salami.   Eat plenty of fruits, such as apples.   Eat plenty of vegetables, such as broccoli, potatoes, and carrots.   Eat beans, peas, and lentils.   Eat grains, such as barley,  rice, couscous, and bulgur wheat.   Eat pasta without cream sauces.   Use skim or nonfat milk and low-fat or nonfat yogurt and cheeses. Do not eat or drink whole milk, cream, ice cream, egg yolks, and hard cheeses.   Do not eat stick margarine or tub margarines that contain trans fats (also called partially hydrogenated oils).   Do not eat cakes, cookies, crackers, or other baked goods that contain trans fats.   Do not eat saturated tropical oils, such as coconut and palm oil.   Exercise as directed by your health care provider. Increase your activity level with activities such as gardening or walking.   Keep all follow-up appointments.  SEEK MEDICAL CARE IF:  You are struggling to maintain a healthy diet or weight.  You need help starting an exercise program.  You need help to stop smoking. SEEK IMMEDIATE MEDICAL CARE IF:  You have chest pain.  You have trouble breathing.   This information is not intended to replace advice given to you by your health care provider. Make sure you discuss any questions you have with your health care provider.   Document Released: 08/05/2005 Document Revised: 08/26/2014 Document Reviewed: 05/28/2013 Elsevier Interactive Patient Education [employer].

## 2016-06-11 NOTE — Progress Notes (Signed)
Subjective:    Patient ID: Stacey Cline, female    DOB: [birth year], 60 y.o.   MRN: [id number]  06/11/2016  Follow-up (for heart follow up)   HPI This 60 y.o. female presents for six month follow-up for hypercholesterolemia, DOE, PVCs with bigeminy.  S/p CT chest angio negative for pulmonary embolism.  No pulmonary etiology.  S/p cardiology consultation.  S/p Holter monitor.  S/p MRI cardiac.  S/p CT angio chest.  Stress testing. Cardiac catheterization.  Not sure of why.   Just follow-up with cardiology; DOE was much improved; now able to walk up stairs; this is a major improvement; now has times where not thinking about it.  Progression.  Now has energy to do things.  Cleared to exercise at last visit.  The faster heart is beating, the more regular and less PVCs.  Hyperpigementation on R lateral abdomen.  Now occurring under breast B.  Has hyperpigmentation in lower back for the past six months.  Intermittent itching. +scaling.  Sister with CVA with slurred speech in July 2017.  Age 19.  Review of Systems  Constitutional: Negative for chills, diaphoresis, fatigue and fever.  Eyes: Negative for visual disturbance.  Respiratory: Positive for shortness of breath. Negative for cough and wheezing.   Cardiovascular: Positive for palpitations. Negative for chest pain and leg swelling.  Gastrointestinal: Negative for abdominal pain, constipation, diarrhea, nausea and vomiting.  Endocrine: Negative for cold intolerance, heat intolerance, polydipsia, polyphagia and polyuria.  Neurological: Negative for dizziness, tremors, seizures, syncope, facial asymmetry, speech difficulty, weakness, light-headedness, numbness and headaches.    Past Medical History:  Diagnosis Date  . Candidiasis of vulva and vagina   . Leiomyoma of uterus, unspecified   . Menopause age 12  . Pain in joint, multiple sites   . Pain in joint, shoulder region   . Papanicolaou smear of cervix with atypical squamous  cells of undetermined significance (ASC-US)   . Postsurgical hypothyroidism   . Pure hypercholesterolemia   . Unspecified vitamin D deficiency   . Ventral hernia, unspecified, without mention of obstruction or gangrene    Past Surgical History:  Procedure Laterality Date  . BREAST BIOPSY  2006   Right  Benign  . CARDIAC CATHETERIZATION N/A 03/06/2016   Procedure: Right/Left Heart Cath and Coronary Angiography;  Surgeon: Wellington Hampshire, MD;  Location: [facility];  Service: Cardiovascular;  Laterality: N/A;  . COSMETIC SURGERY     feet  . TONSILLECTOMY AND ADENOIDECTOMY  1960's  . TOTAL THYROIDECTOMY  2008   Allergies  Allergen Reactions  . Fish Allergy Hives and Itching  . Isovue [Iopamidol] Hives    Pt developed 1 hive on her chest and 2 on her neck area appx 10 minutes post contrast injection. Given 50 mg oral benadryl.  Needs 13 hour pre meds in the future.   Marland Kitchen Penicillins Rash    Has patient had a PCN reaction causing immediate rash, facial/tongue/throat swelling, SOB or lightheadedness with hypotension: Yes Has patient had a PCN reaction causing severe rash involving mucus membranes or skin necrosis: No Has patient had a PCN reaction that required hospitalization No Has patient had a PCN reaction occurring within the last 10 years: Yes If all of the above answers are "NO", then may proceed with Cephalosporin use.   . Shellfish Allergy Hives and Rash   Current Outpatient Prescriptions  Medication Sig Dispense Refill  . Calcium Carbonate-Vitamin D (CALCIUM 500 + D) 500-125 MG-UNIT TABS Take 1 tablet by mouth  daily. Reported on 11/28/2015    . flecainide (TAMBOCOR) 100 MG tablet Take one tablet (100 mg) by mouth twice daily (Patient taking differently: Take one tablet (100 mg) by mouth 3 times daily)    . levothyroxine (SYNTHROID, LEVOTHROID) 88 MCG tablet Take 88 mcg by mouth daily.  0  . simvastatin (ZOCOR) 20 MG tablet Take 1 tablet (20 mg total) by mouth daily at 6  PM. 90 tablet 3  . diltiazem (CARDIZEM CD) 120 MG 24 hr capsule Take 1 capsule (120 mg total) by mouth daily. 90 capsule 3  . ketoconazole (NIZORAL) 2 % cream Apply 1 application topically 2 (two) times daily. 60 g 0   No current facility-administered medications for this visit.    Social History   Social History  . Marital status: Married    Spouse name: N/A  . Number of children: 0  . Years of education: college   Occupational History  . [employer]   Social History Main Topics  . Smoking status: Never Smoker  . Smokeless tobacco: Never Used  . Alcohol use No  . Drug use: No  . Sexual activity: Yes   Other Topics Concern  . Not on file   Social History Narrative   Married x 15 years; Happily, no abuse. Husband is a Theme park manager.       Children: none      Lives: with husband.      Employment:  Retire 06/2012 from [employer] after 35 years; wants to volunteer.      Tobacco:        Alcohol:   Caffeine use: Moderate amount.       Exercise: gym three days per week.        Family History  Problem Relation Age of Onset  . Heart disease Father     CAD/3  AMI in 110's   . Stroke Father     several  . Lung disease Father   . Hypertension Mother   . Diabetes Mother   . Heart disease Mother 24    AMI several; first unsure age.  Pacemaker  . Hyperlipidemia Mother   . Kidney failure Mother   . Diabetes Sister   . Cancer Sister 90    Breast cancer  . Hypertension Brother   . Breast cancer    . Hypertension    . Diabetes type II         Objective:    BP 122/72   Pulse (!) 51   Temp 98.2 F (36.8 C) (Oral)   Resp 18   Ht 5\' 6"  (1.676 m)   Wt 188 lb 9.6 oz (85.5 kg)   SpO2 98%   BMI 30.44 kg/m  Physical Exam  Constitutional: She is oriented to person, place, and time. She appears well-developed and well-nourished. No distress.  HENT:  Head: Normocephalic and atraumatic.  Right Ear: External ear normal.  Left Ear: External ear normal.  Nose: Nose normal.    Mouth/Throat: Oropharynx is clear and moist.  Eyes: Conjunctivae and EOM are normal. Pupils are equal, round, and reactive to light.  Neck: Normal range of motion. Neck supple. Carotid bruit is not present. No thyromegaly present.  Cardiovascular: Normal rate, regular rhythm, normal heart sounds and intact distal pulses.  Exam reveals no gallop and no friction rub.   No murmur heard. Pulmonary/Chest: Effort normal and breath sounds normal. She has no wheezes. She has no rales.  Abdominal: Soft. Bowel sounds are normal. She exhibits  no distension and no mass. There is no tenderness. There is no rebound and no guarding.  Lymphadenopathy:    She has no cervical adenopathy.  Neurological: She is alert and oriented to person, place, and time. No cranial nerve deficit.  Skin: Skin is warm and dry. Rash noted. She is not diaphoretic. No erythema. No pallor.  Scaling rash along torso and proximal legs.  Hyperpigmented rash lower back without scaling.  Psychiatric: She has a normal mood and affect. Her behavior is normal.   Results for orders placed or performed during the hospital encounter of 0000000  Basic Metabolic Panel (BMET)  Result Value Ref Range   Sodium 141 135 - 145 mmol/L   Potassium 4.1 3.5 - 5.1 mmol/L   Chloride 107 101 - 111 mmol/L   CO2 29 22 - 32 mmol/L   Glucose, Bld 78 65 - 99 mg/dL   BUN 20 6 - 20 mg/dL   Creatinine, Ser 1.12 (H) 0.44 - 1.00 mg/dL   Calcium 9.1 8.9 - 10.3 mg/dL   GFR calc non Af Amer 52 (L) >60 mL/min   GFR calc Af Amer 60 (L) >60 mL/min   Anion gap 5 5 - 15       Assessment & Plan:   1. Pure hypercholesterolemia   2. Glucose intolerance (impaired glucose tolerance)   3. Hypothyroidism due to acquired atrophy of thyroid   4. PVC's -- frequent with bigeminy   5. Dyspnea on exertion   6. Rash and nonspecific skin eruption   7. Need for hepatitis C screening test   8. Screening for HIV (human immunodeficiency virus)    -improved DOE; s/p  extensive cardiac evaluation; suffering with frequent PVCs with bigeminy with significant symptoms; improvement with current treatment.  S/p extensive pulmonology evaluation as well including CT angio and PFTs. -obtain labs. -refills provided. -cleared to exercise; recommend weight loss, exercise, low-fat food choices. -rx for Ketoconazole cream for rash.   Orders Placed This Encounter  Procedures  . HIV antibody  . Hepatitis C antibody   Meds ordered this encounter  Medications  . ketoconazole (NIZORAL) 2 % cream    Sig: Apply 1 application topically 2 (two) times daily.    Dispense:  60 g    Refill:  0  . simvastatin (ZOCOR) 20 MG tablet    Sig: Take 1 tablet (20 mg total) by mouth daily at 6 PM.    Dispense:  90 tablet    Refill:  3    Return in about 6 months (around 12/10/2016) for complete physical examiniation.   Brayten Komar Elayne Guerin, M.D. [facility] [address] [place], [place]  [address] [phone] phone [phone] fax

## 2016-06-12 LAB — HIV ANTIBODY (ROUTINE TESTING W REFLEX): HIV 1&2 Ab, 4th Generation: NONREACTIVE

## 2016-06-12 LAB — HEPATITIS C ANTIBODY: HCV Ab: NEGATIVE

## 2016-06-14 LAB — COMPREHENSIVE METABOLIC PANEL
ALBUMIN: 4.4 g/dL (ref 3.6–5.1)
ALK PHOS: 51 U/L (ref 33–130)
ALT: 18 U/L (ref 6–29)
AST: 19 U/L (ref 10–35)
BUN: 13 mg/dL (ref 7–25)
CO2: 27 mmol/L (ref 20–31)
CREATININE: 1.05 mg/dL — AB (ref 0.50–0.99)
Calcium: 9.5 mg/dL (ref 8.6–10.4)
Chloride: 106 mmol/L (ref 98–110)
Glucose, Bld: 94 mg/dL (ref 65–99)
POTASSIUM: 4.4 mmol/L (ref 3.5–5.3)
Sodium: 141 mmol/L (ref 135–146)
TOTAL PROTEIN: 7.3 g/dL (ref 6.1–8.1)
Total Bilirubin: 0.5 mg/dL (ref 0.2–1.2)

## 2016-06-14 LAB — LIPID PANEL
CHOL/HDL RATIO: 3.9 ratio (ref <=5.0)
CHOLESTEROL: 201 mg/dL — AB (ref 125–200)
HDL: 52 mg/dL (ref >=46)
LDL Cholesterol: 121 mg/dL (ref <130)
TRIGLYCERIDES: 141 mg/dL (ref <150)
VLDL: 28 mg/dL (ref <30)

## 2016-06-27 ENCOUNTER — Telehealth: Payer: Self-pay | Admitting: *Deleted

## 2016-06-27 NOTE — Telephone Encounter (Signed)
Patient would like lab results from 06/11/16 .  Can you please comment?

## 2016-07-01 ENCOUNTER — Other Ambulatory Visit: Payer: Self-pay

## 2016-07-02 NOTE — Telephone Encounter (Signed)
She should be taking flecainide 100 mg BID. Per Dr. Olin Pia office note, he was going to allow her some PRN flecainide as needed. I would refill for flecainide 100 mg BID as directed # 90 w/ 6 refills.  But would call patient to confirm- She should not be taking flecainide 100 mg TID.  Thanks!

## 2016-07-03 ENCOUNTER — Telehealth: Payer: Self-pay | Admitting: Internal Medicine

## 2016-07-03 ENCOUNTER — Other Ambulatory Visit: Payer: Self-pay | Admitting: *Deleted

## 2016-07-03 MED ORDER — FLECAINIDE ACETATE 100 MG PO TABS: ORAL_TABLET | ORAL | 3 refills | Status: DC

## 2016-07-03 NOTE — Telephone Encounter (Signed)
Pt calling stating she just received a notification from [facility] in [place]  The heart medication Flecainide should be called in for about 90 days for it would be cheaper for her  Can we please send in a refill on this.  Please advise

## 2016-07-18 ENCOUNTER — Ambulatory Visit (INDEPENDENT_AMBULATORY_CARE_PROVIDER_SITE_OTHER): Payer: BLUE CROSS/BLUE SHIELD | Admitting: Internal Medicine

## 2016-07-18 ENCOUNTER — Encounter: Payer: Self-pay | Admitting: Internal Medicine

## 2016-07-18 VITALS — BP 118/78 | HR 70 | Ht 66.0 in | Wt 193.8 lb

## 2016-07-18 DIAGNOSIS — I200 Unstable angina: Secondary | ICD-10-CM | POA: Diagnosis not present

## 2016-07-18 DIAGNOSIS — R0609 Other forms of dyspnea: Secondary | ICD-10-CM

## 2016-07-18 DIAGNOSIS — I493 Ventricular premature depolarization: Secondary | ICD-10-CM

## 2016-07-18 DIAGNOSIS — R0600 Dyspnea, unspecified: Secondary | ICD-10-CM

## 2016-07-18 NOTE — Patient Instructions (Signed)
Medication Instructions: - Your physician recommends that you continue on your current medications as directed. Please refer to the Current Medication list given to you today.  Labwork: - none ordered  Procedures/Testing: - Your physician has requested that you have an echocardiogram- in 6 months & 12 months. Echocardiography is a painless test that uses sound waves to create images of your heart. It provides your doctor with information about the size and shape of your heart and how well your heart's chambers and valves are working. This procedure takes approximately one hour. There are no restrictions for this procedure.  Follow-Up: - Your physician wants you to follow-up in: 1 year with Dr. Caryl Comes. You will receive a reminder letter in the mail two months in advance. If you don't receive a letter, please call our office to schedule the follow-up appointment.  Any Additional Special Instructions Will Be Listed Below (If Applicable).     If you need a refill on your cardiac medications before your next appointment, please call your pharmacy.

## 2016-07-18 NOTE — Progress Notes (Signed)
Patient Care Team: Wardell Honour, MD as PCP - General (Family Medicine)   HPI  Stacey Cline is a 60 y.o. 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 There have a somewhat unusual morphology with nondescript QRS in V1 and a superior axis.   There were frequent couplets and short runs on Holter monitoring. Burden was 41%.  They have persisted Despite calcium blockers. She was started her on flecainide.    Records and Results Reviewed  she saw Dr. Gaylyn Cheers in the interim. She was feeling significantly improved.  Repeat Holter monitor however demonstrated 37% PVCs  DATE TEST    8/17    Echo   EF 55 %   MR  Mild mod  9/17    MRI   EF normal     Normal without DE MR moderateHe of course   7/17  CATH     EF55-60%     Normal Cors    She has flet much Much better but still has some limitations. She continues to feel skips when she takes hre pulse, but no palpitations   She had modest intermittent sharp pains    DATE PR interval QRSduration Dose  6/17 136 86   0   11/17 180 98 100  Flec       Past Medical History:  Diagnosis Date  . Candidiasis of vulva and vagina   . Leiomyoma of uterus, unspecified   . Menopause age 22  . Pain in joint, multiple sites   . Pain in joint, shoulder region   . Papanicolaou smear of cervix with atypical squamous cells of undetermined significance (ASC-US)   . Postsurgical hypothyroidism   . Pure hypercholesterolemia   . Unspecified vitamin D deficiency   . Ventral hernia, unspecified, without mention of obstruction or gangrene     Past Surgical History:  Procedure Laterality Date  . BREAST BIOPSY  2006   Right  Benign  . CARDIAC CATHETERIZATION N/A 03/06/2016   Procedure: Right/Left Heart Cath and Coronary Angiography;  Surgeon: Wellington Hampshire, MD;  Location: [facility];  Service: Cardiovascular;  Laterality: N/A;  . COSMETIC SURGERY     feet  . TONSILLECTOMY AND ADENOIDECTOMY  1960's   . TOTAL THYROIDECTOMY  2008    Current Outpatient Prescriptions  Medication Sig Dispense Refill  . Calcium Carbonate-Vitamin D (CALCIUM 500 + D) 500-125 MG-UNIT TABS Take 1 tablet by mouth daily. Reported on 11/28/2015    . flecainide (TAMBOCOR) 100 MG tablet Take one tablet (100 mg) by mouth twice daily 180 tablet 3  . ketoconazole (NIZORAL) 2 % cream Apply 1 application topically 2 (two) times daily. 60 g 0  . levothyroxine (SYNTHROID, LEVOTHROID) 88 MCG tablet Take 88 mcg by mouth daily.  0  . simvastatin (ZOCOR) 20 MG tablet Take 1 tablet (20 mg total) by mouth daily at 6 PM. 90 tablet 3  . diltiazem (CARDIZEM CD) 120 MG 24 hr capsule Take 1 capsule (120 mg total) by mouth daily. 90 capsule 3   No current facility-administered medications for this visit.     Allergies  Allergen Reactions  . Fish Allergy Hives and Itching  . Isovue [Iopamidol] Hives    Pt developed 1 hive on her chest and 2 on her neck area appx 10 minutes post contrast injection. Given 50 mg oral benadryl.  Needs 13 hour pre meds in the future.   Marland Kitchen Penicillins Rash  Has patient had a PCN reaction causing immediate rash, facial/tongue/throat swelling, SOB or lightheadedness with hypotension: Yes Has patient had a PCN reaction causing severe rash involving mucus membranes or skin necrosis: No Has patient had a PCN reaction that required hospitalization No Has patient had a PCN reaction occurring within the last 10 years: Yes If all of the above answers are "NO", then may proceed with Cephalosporin use.   . Shellfish Allergy Hives and Rash      Review of Systems negative except from HPI and PMH  Physical Exam BP 118/78 (BP Location: Left Arm, Patient Position: Sitting, Cuff Size: Normal)   Pulse 70   Ht 5\' 6"  (1.676 m)   Wt 193 lb 12 oz (87.9 kg)   BMI 31.27 kg/m  Well developed and well nourished in no acute distress HENT normal E scleral and icterus clear Neck Supple JVP flat; carotids brisk and  full  Regular rate and rhythm with skips every  avg 9  no murmurs gallops or rub Soft with active bowel sounds No clubbing cyanosis Repleted was then switched today Edema Alert and oriented, grossly normal motor and sensory function Skin Warm and Dry  ECG sinus 70 18/10/43 PVC LBBB Super axis with modest fragmentation   Assessment and  Plan  PVCs-unusual morphology superior axis and a largely nondescript QRS morphology in lead V1- fragmented   Dyspnea on exertion  LV dysfunction-mild/minimal  Abnormal ECG  She is much improved despite persistent high burden of PVCs which makes me think that the symptoms were related to the beta blockers.   Her ECG and palpation suggests about 10% PVC  They are fragmented.   A literature search acknowledges increased risk of adverse events, but only identified in pts with SHD.  There is an ongoing trial to look at various fQRS patterns for prognosis in otherwise normal people  We discussed strategies;  In the absence of symptoms of note, we will continue current plans of meds and surveillance   With the possibility of different meds or ablation left on the table   We spent more than 50% of our >25 min visit in face to face counseling regarding the above   Her atypical CP are not likely cardiac given her evaluation here to date--reassurance   Her holter continues to demonstrate 30+% PVCs       More than 50% of 25 min was spent in counseling related to the above     Current medicines are reviewed at length with the patient today .  The patient does not  have concerns regarding medicines.

## 2016-12-10 ENCOUNTER — Encounter: Payer: BLUE CROSS/BLUE SHIELD | Admitting: Family Medicine

## 2016-12-31 ENCOUNTER — Ambulatory Visit (INDEPENDENT_AMBULATORY_CARE_PROVIDER_SITE_OTHER): Payer: BLUE CROSS/BLUE SHIELD | Admitting: Family Medicine

## 2016-12-31 ENCOUNTER — Encounter: Payer: Self-pay | Admitting: Family Medicine

## 2016-12-31 VITALS — BP 144/86 | HR 60 | Temp 98.0°F | Resp 18 | Ht 66.5 in | Wt 199.0 lb

## 2016-12-31 DIAGNOSIS — Z1211 Encounter for screening for malignant neoplasm of colon: Secondary | ICD-10-CM

## 2016-12-31 DIAGNOSIS — R7302 Impaired glucose tolerance (oral): Secondary | ICD-10-CM | POA: Diagnosis not present

## 2016-12-31 DIAGNOSIS — E663 Overweight: Secondary | ICD-10-CM | POA: Diagnosis not present

## 2016-12-31 DIAGNOSIS — Z0000 Encounter for general adult medical examination without abnormal findings: Secondary | ICD-10-CM | POA: Diagnosis not present

## 2016-12-31 DIAGNOSIS — E034 Atrophy of thyroid (acquired): Secondary | ICD-10-CM | POA: Diagnosis not present

## 2016-12-31 DIAGNOSIS — Z1231 Encounter for screening mammogram for malignant neoplasm of breast: Secondary | ICD-10-CM | POA: Diagnosis not present

## 2016-12-31 DIAGNOSIS — I493 Ventricular premature depolarization: Secondary | ICD-10-CM

## 2016-12-31 DIAGNOSIS — Z23 Encounter for immunization: Secondary | ICD-10-CM

## 2016-12-31 DIAGNOSIS — E7800 Pure hypercholesterolemia, unspecified: Secondary | ICD-10-CM | POA: Diagnosis not present

## 2016-12-31 LAB — POCT URINALYSIS DIP (MANUAL ENTRY)
BILIRUBIN UA: NEGATIVE mg/dL
Bilirubin, UA: NEGATIVE
Blood, UA: NEGATIVE
GLUCOSE UA: NEGATIVE mg/dL
Leukocytes, UA: NEGATIVE
Nitrite, UA: NEGATIVE
PROTEIN UA: NEGATIVE mg/dL
Spec Grav, UA: 1.025 (ref 1.010–1.025)
UROBILINOGEN UA: 0.2 U/dL
pH, UA: 5.5 (ref 5.0–8.0)

## 2016-12-31 MED ORDER — HYDROCHLOROTHIAZIDE 12.5 MG PO TABS: 12.5000 mg | ORAL_TABLET | Freq: Every day | ORAL | 1 refills | Status: DC

## 2016-12-31 NOTE — Patient Instructions (Addendum)
Preventive Care 40-64 Years, Female Preventive care refers to lifestyle choices and visits with your health care provider that can promote health and wellness. What does preventive care include?  A yearly physical exam. This is also called an annual well check.  Dental exams once or twice a year.  Routine eye exams. Ask your health care provider how often you should have your eyes checked.  Personal lifestyle choices, including:  Daily care of your teeth and gums.  Regular physical activity.  Eating a healthy diet.  Avoiding tobacco and drug use.  Limiting alcohol use.  Practicing safe sex.  Taking low-dose aspirin daily starting at age 50.  Taking vitamin and mineral supplements as recommended by your health care provider. What happens during an annual well check? The services and screenings done by your health care provider during your annual well check will depend on your age, overall health, lifestyle risk factors, and family history of disease. Counseling  Your health care provider may ask you questions about your:  Alcohol use.  Tobacco use.  Drug use.  Emotional well-being.  Home and relationship well-being.  Sexual activity.  Eating habits.  Work and work environment.  Method of birth control.  Menstrual cycle.  Pregnancy history. Screening  You may have the following tests or measurements:  Height, weight, and BMI.  Blood pressure.  Lipid and cholesterol levels. These may be checked every 5 years, or more frequently if you are over 50 years old.  Skin check.  Lung cancer screening. You may have this screening every year starting at age 55 if you have a 30-pack-year history of smoking and currently smoke or have quit within the past 15 years.  Fecal occult blood test (FOBT) of the stool. You may have this test every year starting at age 50.  Flexible sigmoidoscopy or colonoscopy. You may have a sigmoidoscopy every 5 years or a colonoscopy  every 10 years starting at age 50.  Hepatitis C blood test.  Hepatitis B blood test.  Sexually transmitted disease (STD) testing.  Diabetes screening. This is done by checking your blood sugar (glucose) after you have not eaten for a while (fasting). You may have this done every 1-3 years.  Mammogram. This may be done every 1-2 years. Talk to your health care provider about when you should start having regular mammograms. This may depend on whether you have a family history of breast cancer.  BRCA-related cancer screening. This may be done if you have a family history of breast, ovarian, tubal, or peritoneal cancers.  Pelvic exam and Pap test. This may be done every 3 years starting at age 21. Starting at age 30, this may be done every 5 years if you have a Pap test in combination with an HPV test.  Bone density scan. This is done to screen for osteoporosis. You may have this scan if you are at high risk for osteoporosis. Discuss your test results, treatment options, and if necessary, the need for more tests with your health care provider. Vaccines  Your health care provider may recommend certain vaccines, such as:  Influenza vaccine. This is recommended every year.  Tetanus, diphtheria, and acellular pertussis (Tdap, Td) vaccine. You may need a Td booster every 10 years.  Varicella vaccine. You may need this if you have not been vaccinated.  Zoster vaccine. You may need this after age 60.  Measles, mumps, and rubella (MMR) vaccine. You may need at least one dose of MMR if you were born   in 1957 or later. You may also need a second dose.  Pneumococcal 13-valent conjugate (PCV13) vaccine. You may need this if you have certain conditions and were not previously vaccinated.  Pneumococcal polysaccharide (PPSV23) vaccine. You may need one or two doses if you smoke cigarettes or if you have certain conditions.  Meningococcal vaccine. You may need this if you have certain  conditions.  Hepatitis A vaccine. You may need this if you have certain conditions or if you travel or work in places where you may be exposed to hepatitis A.  Hepatitis B vaccine. You may need this if you have certain conditions or if you travel or work in places where you may be exposed to hepatitis B.  Haemophilus influenzae type b (Hib) vaccine. You may need this if you have certain conditions. Talk to your health care provider about which screenings and vaccines you need and how often you need them. This information is not intended to replace advice given to you by your health care provider. Make sure you discuss any questions you have with your health care provider. Document Released: 09/01/2015 Document Revised: 04/24/2016 Document Reviewed: 06/06/2015 Elsevier Interactive Patient Education  2017 [employer].     IF you received an x-ray today, you will receive an invoice from [facility]. Please contact [facility] at [phone] with questions or concerns regarding your invoice.   IF you received labwork today, you will receive an invoice from [facility]. Please contact [facility] at [phone] with questions or concerns regarding your invoice.   Our billing staff will not be able to assist you with questions regarding bills from these companies.  You will be contacted with the lab results as soon as they are available. The fastest way to get your results is to activate your My Chart account. Instructions are located on the last page of this paperwork. If you have not heard from [place] regarding the results in 2 weeks, please contact this office.     We recommend that you schedule a mammogram for breast cancer screening. Typically, you do not need a referral to do this. Please contact a local imaging center to schedule your mammogram.  [facility] - [phone]  *ask for the Radiology Department [facility] ([place]) - [phone]  or [phone]  [facility] - [phone] [place] [phone] [facility] - [phone]  *ask for the [facility] - [phone]  *ask for the Radiology Department [facility] - [phone]  *ask for the [facility] - [phone]

## 2016-12-31 NOTE — Progress Notes (Signed)
Subjective:    Patient ID: Stacey Cline, female    DOB: 07-28-1956, 61 y.o.   MRN: [id number]  12/31/2016  Annual Exam (Not due for Pap until 09/2017)   HPI This 61 y.o. female presents for Complete Physical Examination.  Last physical:  11-28-2015 Pap smear:  09-28-2014  WNL HPV negative. Mammogram:  07-01-2014 Colonoscopy:  08/19/2006 Bone density:  n/d Eye exam:  none Dental exam:  Every six months   Visual Acuity Screening   Right eye Left eye Both eyes  Without correction:     With correction: 20/13 -2 20/20 -1 20/13 -1     Immunization History  Administered Date(s) Administered  . Hepatitis B 09/19/2004  . Td 08/19/2006  . Tdap 12/31/2016   BP Readings from Last 3 Encounters:  12/31/16 (!) 144/86  07/18/16 118/78  06/11/16 122/72   Wt Readings from Last 3 Encounters:  12/31/16 199 lb (90.3 kg)  07/18/16 193 lb 12 oz (87.9 kg)  06/11/16 188 lb 9.6 oz (85.5 kg)   Hypertension: blood pressure readings have been high lately; running 160-180. No exercise since heat issues.  Due for MRI for PVCs with bigeminy.  Cannot tolerate exercise due to DOE due to bigeminy. Unable to exercise.  Not sure how high weight increased.  Has been gaining weight.  209 pounds.  Refuses blood pressure medication.    Review of Systems  Constitutional: Negative for activity change, appetite change, chills, diaphoresis, fatigue, fever and unexpected weight change.  HENT: Negative for congestion, dental problem, drooling, ear discharge, ear pain, facial swelling, hearing loss, mouth sores, nosebleeds, postnasal drip, rhinorrhea, sinus pressure, sneezing, sore throat, tinnitus, trouble swallowing and voice change.   Eyes: Negative for photophobia, pain, discharge, redness, itching and visual disturbance.  Respiratory: Negative for apnea, cough, choking, chest tightness, shortness of breath, wheezing and stridor.   Cardiovascular: Negative for chest pain, palpitations and leg swelling.    Gastrointestinal: Negative for abdominal distention, abdominal pain, anal bleeding, blood in stool, constipation, diarrhea, nausea, rectal pain and vomiting.  Endocrine: Negative for cold intolerance, heat intolerance, polydipsia, polyphagia and polyuria.  Genitourinary: Negative for decreased urine volume, difficulty urinating, dyspareunia, dysuria, enuresis, flank pain, frequency, genital sores, hematuria, menstrual problem, pelvic pain, urgency, vaginal bleeding, vaginal discharge and vaginal pain.       Nocturia x 0-6 depending on intake.  No urinary leakage.  Musculoskeletal: Negative for arthralgias, back pain, gait problem, joint swelling, myalgias, neck pain and neck stiffness.  Skin: Negative for color change, pallor, rash and wound.  Allergic/Immunologic: Negative for environmental allergies, food allergies and immunocompromised state.  Neurological: Negative for dizziness, tremors, seizures, syncope, facial asymmetry, speech difficulty, weakness, light-headedness, numbness and headaches.  Hematological: Negative for adenopathy. Does not bruise/bleed easily.  Psychiatric/Behavioral: Negative for agitation, behavioral problems, confusion, decreased concentration, dysphoric mood, hallucinations, self-injury, sleep disturbance and suicidal ideas. The patient is not nervous/anxious and is not hyperactive.        Bedtime 1:30am; wakes up 10:30am.    Past Medical History:  Diagnosis Date  . Candidiasis of vulva and vagina   . Leiomyoma of uterus, unspecified   . Menopause age 60  . Pain in joint, multiple sites   . Pain in joint, shoulder region   . Papanicolaou smear of cervix with atypical squamous cells of undetermined significance (ASC-US)   . Postsurgical hypothyroidism   . Pure hypercholesterolemia   . Unspecified vitamin D deficiency   . Ventral hernia, unspecified, without mention of obstruction or gangrene  Past Surgical History:  Procedure Laterality Date  . BREAST  BIOPSY  2006   Right  Benign  . CARDIAC CATHETERIZATION N/A 03/06/2016   Procedure: Right/Left Heart Cath and Coronary Angiography;  Surgeon: Wellington Hampshire, MD;  Location: [facility];  Service: Cardiovascular;  Laterality: N/A;  . COSMETIC SURGERY     feet  . TONSILLECTOMY AND ADENOIDECTOMY  1960's  . TOTAL THYROIDECTOMY  2008   Allergies  Allergen Reactions  . Fish Allergy Hives and Itching  . Isovue [Iopamidol] Hives    Pt developed 1 hive on her chest and 2 on her neck area appx 10 minutes post contrast injection. Given 50 mg oral benadryl.  Needs 13 hour pre meds in the future.   Marland Kitchen Penicillins Rash    Has patient had a PCN reaction causing immediate rash, facial/tongue/throat swelling, SOB or lightheadedness with hypotension: Yes Has patient had a PCN reaction causing severe rash involving mucus membranes or skin necrosis: No Has patient had a PCN reaction that required hospitalization No Has patient had a PCN reaction occurring within the last 10 years: Yes If all of the above answers are "NO", then may proceed with Cephalosporin use.   . Shellfish Allergy Hives and Rash    Social History   Social History  . Marital status: Married    Spouse name: N/A  . Number of children: 0  . Years of education: college   Occupational History  . [employer]   Social History Main Topics  . Smoking status: Never Smoker  . Smokeless tobacco: Never Used  . Alcohol use No  . Drug use: No  . Sexual activity: Yes   Other Topics Concern  . Not on file   Social History Narrative   Married x 15 years; Happily, no abuse. Husband is a Theme park manager.       Children: none      Lives: with husband.      Employment:  Retire 06/2012 from [employer] after 35 years; wants to volunteer.      Tobacco:        Alcohol:   Caffeine use: Moderate amount.       Exercise: gym three days per week.        Family History  Problem Relation Age of Onset  . Heart disease Father        CAD/3   AMI in 61's   . Stroke Father        several  . Lung disease Father   . Hypertension Mother   . Diabetes Mother   . Heart disease Mother 57       AMI several; first unsure age.  Pacemaker  . Hyperlipidemia Mother   . Kidney failure Mother   . Diabetes Sister   . Cancer Sister 3       Breast cancer  . Hypertension Brother   . Stroke Sister   . Hypertension Sister   . Breast cancer Unknown   . Hypertension Unknown   . Diabetes type II Unknown        Objective:    BP (!) 144/86   Pulse 60   Temp 98 F (36.7 C) (Oral)   Resp 18   Ht 5' 6.5" (1.689 m)   Wt 199 lb (90.3 kg)   SpO2 100%   BMI 31.64 kg/m  Physical Exam  Constitutional: She is oriented to person, place, and time. She appears well-developed and well-nourished. No distress.  HENT:  Head:  Normocephalic and atraumatic.  Right Ear: External ear normal.  Left Ear: External ear normal.  Nose: Nose normal.  Mouth/Throat: Oropharynx is clear and moist.  Eyes: Conjunctivae and EOM are normal. Pupils are equal, round, and reactive to light.  Neck: Normal range of motion and full passive range of motion without pain. Neck supple. No JVD present. Carotid bruit is not present. No thyromegaly present.  Cardiovascular: Normal rate, regular rhythm and normal heart sounds.  Exam reveals no gallop and no friction rub.   No murmur heard. Pulmonary/Chest: Effort normal and breath sounds normal. She has no wheezes. She has no rales. Right breast exhibits no inverted nipple, no mass, no nipple discharge, no skin change and no tenderness. Left breast exhibits no inverted nipple, no mass, no nipple discharge, no skin change and no tenderness. Breasts are symmetrical.  Abdominal: Soft. Bowel sounds are normal. She exhibits no distension and no mass. There is no tenderness. There is no rebound and no guarding.  Musculoskeletal:       Right shoulder: Normal.       Left shoulder: Normal.       Cervical back: Normal.  Lymphadenopathy:     She has no cervical adenopathy.  Neurological: She is alert and oriented to person, place, and time. She has normal reflexes. No cranial nerve deficit. She exhibits normal muscle tone. Coordination normal.  Skin: Skin is warm and dry. No rash noted. She is not diaphoretic. No erythema. No pallor.  Psychiatric: She has a normal mood and affect. Her behavior is normal. Judgment and thought content normal.  Nursing note and vitals reviewed.  Results for orders placed or performed in visit on 12/31/16  POCT urinalysis dipstick  Result Value Ref Range   Color, UA straw (A) yellow   Clarity, UA clear clear   Glucose, UA negative negative mg/dL   Bilirubin, UA negative negative   Ketones, POC UA negative negative mg/dL   Spec Grav, UA 1.025 1.010 - 1.025   Blood, UA negative negative   pH, UA 5.5 5.0 - 8.0   Protein Ur, POC negative negative mg/dL   Urobilinogen, UA 0.2 0.2 or 1.0 E.U./dL   Nitrite, UA Negative Negative   Leukocytes, UA Negative Negative   Depression screen [facility] 2/9 12/31/2016 06/11/2016 02/07/2016 11/28/2015 03/29/2015  Decreased Interest 0 0 0 0 0  Down, Depressed, Hopeless 0 0 0 0 0  PHQ - 2 Score 0 0 0 0 0   Fall Risk  12/31/2016 06/11/2016 02/07/2016 02/07/2016 11/28/2015  Falls in the past year? No No No No No       Assessment & Plan:   1. Routine physical examination   2. PVC's -- frequent with bigeminy   3. Glucose intolerance (impaired glucose tolerance)   4. Hypothyroidism due to acquired atrophy of thyroid   5. Overweight (BMI 25.0-29.9)   6. Pure hypercholesterolemia   7. Colon cancer screening   8. Need for Tdap vaccination   9. Encounter for screening mammogram for breast cancer    -anticipatory guidance provided --- exercise, weight loss, safe driving practices, aspirin 81mg  daily. -obtain age appropriate screening labs and labs for chronic disease management. -s/p TDAP -refer for mammogram and for colonoscopy. -new onset hypertension; rx for HCTZ  12.5mg  provided.   Orders Placed This Encounter  Procedures  . MM SCREENING BREAST TOMO BILATERAL    Standing Status:   Future    Standing Expiration Date:   03/03/2018    Order Specific Question:  Reason for Exam (SYMPTOM  OR DIAGNOSIS REQUIRED)    Answer:   annual screening    Order Specific Question:   Preferred imaging location?    Answer:   [facility]  . Tdap vaccine greater than or equal to 7yo IM  . CBC with Differential/Platelet  . Comprehensive metabolic panel    Order Specific Question:   Has the patient fasted?    Answer:   Yes  . Hemoglobin A1c  . Lipid panel    Order Specific Question:   Has the patient fasted?    Answer:   Yes  . TSH  . T4, free  . Ambulatory referral to Gastroenterology    Referral Priority:   Routine    Referral Type:   Consultation    Referral Reason:   Specialty Services Required    Number of Visits Requested:   1  . POCT urinalysis dipstick   Meds ordered this encounter  Medications  . diltiazem (CARDIZEM CD) 120 MG 24 hr capsule    Sig: TAKE 1 CAPSULE (120 MG TOTAL) BY MOUTH DAILY.    Refill:  3  . hydrochlorothiazide (HYDRODIURIL) 12.5 MG tablet    Sig: Take 1 tablet (12.5 mg total) by mouth daily.    Dispense:  90 tablet    Refill:  1    Return in about 6 months (around 07/03/2017) for recheck high blood pressure.   Lynlee Stratton Elayne Guerin, M.D. Primary Care at [facility] [address] [place], [place]  [address] [phone] phone [phone] fax

## 2017-01-01 LAB — COMPREHENSIVE METABOLIC PANEL
A/G RATIO: 1.4 (ref 1.2–2.2)
ALBUMIN: 4 g/dL (ref 3.6–4.8)
ALK PHOS: 59 IU/L (ref 39–117)
ALT: 24 IU/L (ref 0–32)
AST: 23 IU/L (ref 0–40)
BILIRUBIN TOTAL: 0.2 mg/dL (ref 0.0–1.2)
BUN / CREAT RATIO: 11 — AB (ref 12–28)
BUN: 10 mg/dL (ref 8–27)
CHLORIDE: 102 mmol/L (ref 96–106)
CO2: 26 mmol/L (ref 18–29)
Calcium: 9.4 mg/dL (ref 8.7–10.3)
Creatinine, Ser: 0.91 mg/dL (ref 0.57–1.00)
GFR calc non Af Amer: 68 mL/min/{1.73_m2} (ref >59)
GFR, EST AFRICAN AMERICAN: 78 mL/min/{1.73_m2} (ref >59)
GLUCOSE: 92 mg/dL (ref 65–99)
Globulin, Total: 2.8 g/dL (ref 1.5–4.5)
POTASSIUM: 4.2 mmol/L (ref 3.5–5.2)
Sodium: 142 mmol/L (ref 134–144)
TOTAL PROTEIN: 6.8 g/dL (ref 6.0–8.5)

## 2017-01-01 LAB — LIPID PANEL
Chol/HDL Ratio: 3.8 ratio (ref 0.0–4.4)
Cholesterol, Total: 195 mg/dL (ref 100–199)
HDL: 51 mg/dL (ref >39)
LDL CALC: 114 mg/dL — AB (ref 0–99)
TRIGLYCERIDES: 150 mg/dL — AB (ref 0–149)
VLDL Cholesterol Cal: 30 mg/dL (ref 5–40)

## 2017-01-01 LAB — CBC WITH DIFFERENTIAL/PLATELET
BASOS ABS: 0 10*3/uL (ref 0.0–0.2)
BASOS: 0 %
EOS (ABSOLUTE): 0.1 10*3/uL (ref 0.0–0.4)
Eos: 1 %
Hematocrit: 40.2 % (ref 34.0–46.6)
Hemoglobin: 13.4 g/dL (ref 11.1–15.9)
IMMATURE GRANS (ABS): 0 10*3/uL (ref 0.0–0.1)
Immature Granulocytes: 0 %
LYMPHS: 41 %
Lymphocytes Absolute: 2.3 10*3/uL (ref 0.7–3.1)
MCH: 28.3 pg (ref 26.6–33.0)
MCHC: 33.3 g/dL (ref 31.5–35.7)
MCV: 85 fL (ref 79–97)
Monocytes Absolute: 0.3 10*3/uL (ref 0.1–0.9)
Monocytes: 6 %
NEUTROS ABS: 2.9 10*3/uL (ref 1.4–7.0)
Neutrophils: 52 %
PLATELETS: 241 10*3/uL (ref 150–379)
RBC: 4.73 x10E6/uL (ref 3.77–5.28)
RDW: 15.6 % — ABNORMAL HIGH (ref 12.3–15.4)
WBC: 5.6 10*3/uL (ref 3.4–10.8)

## 2017-01-01 LAB — TSH: TSH: 2.71 u[IU]/mL (ref 0.450–4.500)

## 2017-01-01 LAB — HEMOGLOBIN A1C
Est. average glucose Bld gHb Est-mCnc: 126 mg/dL
HEMOGLOBIN A1C: 6 % — AB (ref 4.8–5.6)

## 2017-01-06 LAB — T4, FREE

## 2017-01-07 LAB — T4, FREE: FREE T4: 1.51 ng/dL (ref 0.82–1.77)

## 2017-01-07 LAB — SPECIMEN STATUS REPORT

## 2017-01-09 ENCOUNTER — Other Ambulatory Visit: Payer: Self-pay

## 2017-01-09 ENCOUNTER — Telehealth: Payer: Self-pay | Admitting: Family Medicine

## 2017-01-09 ENCOUNTER — Ambulatory Visit (INDEPENDENT_AMBULATORY_CARE_PROVIDER_SITE_OTHER): Payer: BLUE CROSS/BLUE SHIELD

## 2017-01-09 DIAGNOSIS — I493 Ventricular premature depolarization: Secondary | ICD-10-CM | POA: Diagnosis not present

## 2017-01-09 NOTE — Telephone Encounter (Signed)
Pt wants labs read to her and she also wants her thyroid doctor to have a copy of her thyroid test results.   Please advise: [phone]

## 2017-01-10 ENCOUNTER — Telehealth: Payer: Self-pay | Admitting: Family Medicine

## 2017-01-10 NOTE — Telephone Encounter (Signed)
Pt is looking for lab results   Best number [phone]

## 2017-01-14 ENCOUNTER — Telehealth: Payer: Self-pay

## 2017-01-14 NOTE — Telephone Encounter (Signed)
Patient called back for echo normal echo results. Patient was pleases that results were normal but is dissatisfied with current symptoms.   She states "she still has no/reduced stamina. DOE is increasingly getting worse. She cannot get through a song in the choir without feeling winded and that is not like her. She is still taking the flecainide as prescribed BID and she is still taking the Dilt as prescribed QD but with no relief of symptoms. Wants to know if she can be scheduled to have a follow up conversation with Dr. Caryl Comes for another plan of action because all her labs and tests are coming back normal but she is progressively getting worse."  Patient had yearly check up with her PCP and had labs done including TSH which all came back normal. Instructed me to call PCP and get labs faxed over for Dr. Caryl Comes review. She states "she knows that she has gained weight and is uncomfortable with weight gain". PCP prescribed hydrochlorothiazide 12.5 mg QD for patient to take for border line hypertension until patient "gets the weight off".   Overall patient feels she's "progressivley gotten worse over the last 6 months" and wants to revamp care plan because "she feels there is something going on and if her labs and tests are saying its not her heart then what is it?" I informed her I would get all of this information to Dr. Caryl Comes for his review and someone form his office would contact him if she needed to be scheduled from his recommendations. We are both agreeable to plans.

## 2017-01-14 NOTE — Telephone Encounter (Signed)
Left message for patient to call back for echo results 

## 2017-01-14 NOTE — Telephone Encounter (Signed)
Pt given lab results and advised to watch carbohydrates intake along with exercise Verbalized understanding

## 2017-01-22 ENCOUNTER — Telehealth: Payer: Self-pay | Admitting: Internal Medicine

## 2017-01-22 NOTE — Telephone Encounter (Signed)
Lmov for patient to call back and schedule appt with Dr Caryl Comes

## 2017-01-22 NOTE — Telephone Encounter (Signed)
-----   Message from Emily Filbert, RN sent at 01/21/2017  2:39 PM EDT ----- Per Dr. Caryl Comes- patient had a recent normal echo, but still with SOB.  Please call to arrange follow up with him next available.   Thanks!!

## 2017-01-27 NOTE — Telephone Encounter (Signed)
Spoke with patient she was on the other line and she stated she would call us back

## 2017-02-03 ENCOUNTER — Other Ambulatory Visit: Payer: Self-pay

## 2017-02-03 ENCOUNTER — Telehealth: Payer: Self-pay

## 2017-02-03 DIAGNOSIS — Z1211 Encounter for screening for malignant neoplasm of colon: Secondary | ICD-10-CM

## 2017-02-03 NOTE — Telephone Encounter (Signed)
Gastroenterology Pre-Procedure Review  Request Date: 04/14/17 Requesting Physician: Dr. Allen Norris  PATIENT REVIEW QUESTIONS: The patient responded to the following health history questions as indicated:    1. Are you having any GI issues? no 2. Do you have a personal history of Polyps? no 3. Do you have a family history of Colon Cancer or Polyps? yes (mother and father) 73. Diabetes Mellitus? no 5. Joint replacements in the past 12 months?no 6. Major health problems in the past 3 months?yes (Pt stated she has something going on with her heart-I am sending Cardiac clearance to [facility]) 7. Any artificial heart valves, MVP, or defibrillator?no    MEDICATIONS & ALLERGIES:    Patient reports the following regarding taking any anticoagulation/antiplatelet therapy:   Plavix, Coumadin, Eliquis, Xarelto, Lovenox, Pradaxa, Brilinta, or Effient? no Aspirin? no  Patient confirms/reports the following medications:  Current Outpatient Prescriptions  Medication Sig Dispense Refill  . Calcium Carbonate-Vitamin D (CALCIUM 500 + D) 500-125 MG-UNIT TABS Take 1 tablet by mouth daily. Reported on 11/28/2015    . diltiazem (CARDIZEM CD) 120 MG 24 hr capsule Take 1 capsule (120 mg total) by mouth daily. 90 capsule 3  . diltiazem (CARDIZEM CD) 120 MG 24 hr capsule TAKE 1 CAPSULE (120 MG TOTAL) BY MOUTH DAILY.  3  . flecainide (TAMBOCOR) 100 MG tablet Take one tablet (100 mg) by mouth twice daily 180 tablet 3  . hydrochlorothiazide (HYDRODIURIL) 12.5 MG tablet Take 1 tablet (12.5 mg total) by mouth daily. 90 tablet 1  . ketoconazole (NIZORAL) 2 % cream Apply 1 application topically 2 (two) times daily. (Patient not taking: Reported on 12/31/2016) 60 g 0  . levothyroxine (SYNTHROID, LEVOTHROID) 88 MCG tablet Take 88 mcg by mouth daily.  0  . simvastatin (ZOCOR) 20 MG tablet Take 1 tablet (20 mg total) by mouth daily at 6 PM. 90 tablet 3   No current facility-administered medications for this visit.     Patient  confirms/reports the following allergies:  Allergies  Allergen Reactions  . Fish Allergy Hives and Itching  . Isovue [Iopamidol] Hives    Pt developed 1 hive on her chest and 2 on her neck area appx 10 minutes post contrast injection. Given 50 mg oral benadryl.  Needs 13 hour pre meds in the future.   Marland Kitchen Penicillins Rash    Has patient had a PCN reaction causing immediate rash, facial/tongue/throat swelling, SOB or lightheadedness with hypotension: Yes Has patient had a PCN reaction causing severe rash involving mucus membranes or skin necrosis: No Has patient had a PCN reaction that required hospitalization No Has patient had a PCN reaction occurring within the last 10 years: Yes If all of the above answers are "NO", then may proceed with Cephalosporin use.   . Shellfish Allergy Hives and Rash    No orders of the defined types were placed in this encounter.   AUTHORIZATION INFORMATION Primary Insurance: 1D#: Group #:  Secondary Insurance: 1D#: Group #:  SCHEDULE INFORMATION: Date: 04/14/17 Time: Location:[facility]

## 2017-03-11 ENCOUNTER — Other Ambulatory Visit: Payer: Self-pay | Admitting: Cardiovascular Disease

## 2017-03-12 ENCOUNTER — Other Ambulatory Visit: Payer: Self-pay | Admitting: Cardiovascular Disease

## 2017-03-13 ENCOUNTER — Encounter: Payer: Self-pay | Admitting: Internal Medicine

## 2017-03-13 ENCOUNTER — Ambulatory Visit (INDEPENDENT_AMBULATORY_CARE_PROVIDER_SITE_OTHER): Payer: BLUE CROSS/BLUE SHIELD | Admitting: Internal Medicine

## 2017-03-13 VITALS — BP 128/84 | HR 65 | Ht 66.0 in | Wt 197.2 lb

## 2017-03-13 DIAGNOSIS — R0609 Other forms of dyspnea: Secondary | ICD-10-CM | POA: Diagnosis not present

## 2017-03-13 DIAGNOSIS — I519 Heart disease, unspecified: Secondary | ICD-10-CM | POA: Diagnosis not present

## 2017-03-13 DIAGNOSIS — R0600 Dyspnea, unspecified: Secondary | ICD-10-CM

## 2017-03-13 DIAGNOSIS — I493 Ventricular premature depolarization: Secondary | ICD-10-CM | POA: Diagnosis not present

## 2017-03-13 NOTE — Patient Instructions (Signed)
Medication Instructions: - Your physician has recommended you make the following change in your medication:  1) hold cardizem (diltiazem) x 2 weeks to see if you notice an improvement with your shortness of breath- please call the office at that time to update Dr. Caryl Comes on your symptoms  Labwork: - none ordered  Procedures/Testing: - none ordered  Follow-Up: - Your physician wants you to follow-up in: 6 months with Dr. Caryl Comes. You will receive a reminder letter in the mail two months in advance. If you don't receive a letter, please call our office to schedule the follow-up appointment.   Any Additional Special Instructions Will Be Listed Below (If Applicable).     If you need a refill on your cardiac medications before your next appointment, please call your pharmacy.

## 2017-03-13 NOTE — Progress Notes (Signed)
Patient Care Team: Wardell Honour, MD as PCP - General (Family Medicine)   HPI  Stacey Cline is a 61 y.o. 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 There have a somewhat unusual morphology with nondescript QRS in V1 and a superior axis.   There were frequent couplets and short runs on Holter monitoring. Burden was 41%.  They have persisted Despite calcium blockers. She was started her on flecainide.    Records and Results Reviewed  she saw Dr. Gaylyn Cheers in the interim. She was feeling significantly improved.  Repeat Holter monitor however demonstrated 37% PVCs   QRS morphology demonstrated significant fragmentation DATE TEST    8/17 Echo   EF 55 %   MR  Mild mod  9/17 MRI   EF normal     Normal without DE MR moderateHe of course   7/17  CATH     EF55-60%     Normal Cors   5/18 Echo EF 55-65%         She felt much improved with discontinuation of beta blockers   She has had few problems with PVCs. She continues however to struggle with exercise intolerance.  DATE PR interval QRSduration Dose  6/17 136 86   0   11/17 180 98 100  Flec  6/18               Past Medical History:  Diagnosis Date  . Candidiasis of vulva and vagina   . Leiomyoma of uterus, unspecified   . Menopause age 53  . Pain in joint, multiple sites   . Pain in joint, shoulder region   . Papanicolaou smear of cervix with atypical squamous cells of undetermined significance (ASC-US)   . Postsurgical hypothyroidism   . Pure hypercholesterolemia   . Unspecified vitamin D deficiency   . Ventral hernia, unspecified, without mention of obstruction or gangrene     Past Surgical History:  Procedure Laterality Date  . BREAST BIOPSY  2006   Right  Benign  . CARDIAC CATHETERIZATION N/A 03/06/2016   Procedure: Right/Left Heart Cath and Coronary Angiography;  Surgeon: Wellington Hampshire, MD;  Location: [facility];  Service: Cardiovascular;  Laterality: N/A;    . COSMETIC SURGERY     feet  . TONSILLECTOMY AND ADENOIDECTOMY  1960's  . TOTAL THYROIDECTOMY  2008    Current Outpatient Prescriptions  Medication Sig Dispense Refill  . diltiazem (CARDIZEM CD) 120 MG 24 hr capsule TAKE 1 CAPSULE (120 MG TOTAL) BY MOUTH DAILY.  3  . flecainide (TAMBOCOR) 100 MG tablet Take one tablet (100 mg) by mouth twice daily 180 tablet 3  . hydrochlorothiazide (HYDRODIURIL) 12.5 MG tablet Take 1 tablet (12.5 mg total) by mouth daily. 90 tablet 1  . ketoconazole (NIZORAL) 2 % cream Apply 1 application topically 2 (two) times daily. 60 g 0  . levothyroxine (SYNTHROID, LEVOTHROID) 88 MCG tablet Take 88 mcg by mouth daily.  0  . simvastatin (ZOCOR) 20 MG tablet Take 1 tablet (20 mg total) by mouth daily at 6 PM. 90 tablet 3   No current facility-administered medications for this visit.     Allergies  Allergen Reactions  . Fish Allergy Hives and Itching  . Isovue [Iopamidol] Hives    Pt developed 1 hive on her chest and 2 on her neck area appx 10 minutes post contrast injection. Given 50 mg oral benadryl.  Needs 13 hour pre meds in the  future.   Marland Kitchen Penicillins Rash    Has patient had a PCN reaction causing immediate rash, facial/tongue/throat swelling, SOB or lightheadedness with hypotension: Yes Has patient had a PCN reaction causing severe rash involving mucus membranes or skin necrosis: No Has patient had a PCN reaction that required hospitalization No Has patient had a PCN reaction occurring within the last 10 years: Yes If all of the above answers are "NO", then may proceed with Cephalosporin use.   . Shellfish Allergy Hives and Rash      Review of Systems negative except from HPI and PMH  Physical Exam BP 128/84 (BP Location: Left Arm, Patient Position: Sitting, Cuff Size: Large)   Pulse 65   Ht 5\' 6"  (1.676 m)   Wt 197 lb 4 oz (89.5 kg)   BMI 31.84 kg/m  Well developed and well nourished in no acute distress HENT normal E scleral and icterus  clear Neck Supple JVP flat; carotids brisk and full  Regular rate and rhythm with skips every  avg 9  no murmurs gallops or rub Soft with active bowel sounds No clubbing cyanosis Repleted was then switched today Edema Alert and oriented, grossly normal motor and sensory function Skin Warm and Dry  ECG sinus 70 18/10/43    Assessment and  Plan  PVCs-unusual morphology superior axis and a largely nondescript QRS morphology in lead V1- fragmented   Dyspnea on exertion  LV dysfunction-mild/minimal  Abnormal ECG  PVCs are largely quiescent.  I'm not sure why she remains short of breath. We will have her hold her Cardizem for couple of weeks to see if this has any impact on her symptoms.    Current medicines are reviewed at length with the patient today .  The patient does not  have concerns regarding medicines.

## 2017-03-20 ENCOUNTER — Encounter: Payer: Self-pay | Admitting: Internal Medicine

## 2017-03-20 NOTE — Progress Notes (Signed)
Cardiac Clearance form received from [facility] for colonoscopy pending 04/14/17 with Dr. Allen Norris.  Clearance signed by Dr. Caryl Comes and faxed back to [facility] at [phone]. Confirmation received.

## 2017-04-08 ENCOUNTER — Encounter: Payer: Self-pay | Admitting: *Deleted

## 2017-04-11 NOTE — Discharge Instructions (Signed)
General Anesthesia, Adult, Care After °These instructions provide you with information about caring for yourself after your procedure. Your health care provider may also give you more specific instructions. Your treatment has been planned according to current medical practices, but problems sometimes occur. Call your health care provider if you have any problems or questions after your procedure. °What can I expect after the procedure? °After the procedure, it is common to have: °· Vomiting. °· A sore throat. °· Mental slowness. ° °It is common to feel: °· Nauseous. °· Cold or shivery. °· Sleepy. °· Tired. °· Sore or achy, even in parts of your body where you did not have surgery. ° °Follow these instructions at home: °For at least 24 hours after the procedure: °· Do not: °? Participate in activities where you could fall or become injured. °? Drive. °? Use heavy machinery. °? Drink alcohol. °? Take sleeping pills or medicines that cause drowsiness. °? Make important decisions or sign legal documents. °? Take care of children on your own. °· Rest. °Eating and drinking °· If you vomit, drink water, juice, or soup when you can drink without vomiting. °· Drink enough fluid to keep your urine clear or pale yellow. °· Make sure you have little or no nausea before eating solid foods. °· Follow the diet recommended by your health care provider. °General instructions °· Have a responsible adult stay with you until you are awake and alert. °· Return to your normal activities as told by your health care provider. Ask your health care provider what activities are safe for you. °· Take over-the-counter and prescription medicines only as told by your health care provider. °· If you smoke, do not smoke without supervision. °· Keep all follow-up visits as told by your health care provider. This is important. °Contact a health care provider if: °· You continue to have nausea or vomiting at home, and medicines are not helpful. °· You  cannot drink fluids or start eating again. °· You cannot urinate after 8-12 hours. °· You develop a skin rash. °· You have fever. °· You have increasing redness at the site of your procedure. °Get help right away if: °· You have difficulty breathing. °· You have chest pain. °· You have unexpected bleeding. °· You feel that you are having a life-threatening or urgent problem. °This information is not intended to replace advice given to you by your health care provider. Make sure you discuss any questions you have with your health care provider. °Document Released: 11/11/2000 Document Revised: 01/08/2016 Document Reviewed: 07/20/2015 °Elsevier Interactive Patient Education © 2018 [employer] ° °

## 2017-04-14 ENCOUNTER — Ambulatory Visit: Payer: BLUE CROSS/BLUE SHIELD | Admitting: Anesthesiology

## 2017-04-14 ENCOUNTER — Encounter
Admission: RE | Disposition: A | Discharge status: Home / Self Care | Payer: Self-pay | Source: Ambulatory Visit | Attending: Gastroenterology

## 2017-04-14 ENCOUNTER — Ambulatory Visit
Admission: RE | Admit: 2017-04-14 | Discharge: 2017-04-14 | Disposition: A | Discharge status: Home / Self Care | Payer: BLUE CROSS/BLUE SHIELD | Source: Ambulatory Visit | Attending: Gastroenterology | Admitting: Gastroenterology

## 2017-04-14 DIAGNOSIS — E890 Postprocedural hypothyroidism: Secondary | ICD-10-CM | POA: Insufficient documentation

## 2017-04-14 DIAGNOSIS — E559 Vitamin D deficiency, unspecified: Secondary | ICD-10-CM | POA: Insufficient documentation

## 2017-04-14 DIAGNOSIS — K5730 Diverticulosis of large intestine without perforation or abscess without bleeding: Secondary | ICD-10-CM | POA: Insufficient documentation

## 2017-04-14 DIAGNOSIS — Z1211 Encounter for screening for malignant neoplasm of colon: Secondary | ICD-10-CM | POA: Diagnosis present

## 2017-04-14 DIAGNOSIS — K640 First degree hemorrhoids: Secondary | ICD-10-CM | POA: Insufficient documentation

## 2017-04-14 DIAGNOSIS — E7800 Pure hypercholesterolemia, unspecified: Secondary | ICD-10-CM | POA: Insufficient documentation

## 2017-04-14 DIAGNOSIS — Z79899 Other long term (current) drug therapy: Secondary | ICD-10-CM | POA: Diagnosis not present

## 2017-04-14 HISTORY — DX: Ventricular premature depolarization: I49.3

## 2017-04-14 HISTORY — DX: Presence of dental prosthetic device (complete) (partial): Z97.2

## 2017-04-14 HISTORY — PX: COLONOSCOPY WITH PROPOFOL: SHX5780

## 2017-04-14 HISTORY — DX: Dyspnea, unspecified: R06.00

## 2017-04-14 SURGERY — COLONOSCOPY WITH PROPOFOL
Anesthesia: General | Site: Rectum | Wound class: Dirty or Infected

## 2017-04-14 MED ORDER — OXYCODONE HCL 5 MG PO TABS: 5.0000 mg | ORAL_TABLET | Freq: Once | ORAL | Status: DC | PRN

## 2017-04-14 MED ORDER — PROPOFOL 10 MG/ML IV BOLUS
INTRAVENOUS | Status: DC | PRN
  Administered 2017-04-14: 20 mg via INTRAVENOUS
  Administered 2017-04-14 (×2): 50 mg via INTRAVENOUS
  Administered 2017-04-14: 100 mg via INTRAVENOUS

## 2017-04-14 MED ORDER — FENTANYL CITRATE (PF) 100 MCG/2ML IJ SOLN: 25.0000 ug | INTRAMUSCULAR | Status: DC | PRN

## 2017-04-14 MED ORDER — MEPERIDINE HCL 25 MG/ML IJ SOLN: 6.2500 mg | INTRAMUSCULAR | Status: DC | PRN

## 2017-04-14 MED ORDER — PROMETHAZINE HCL 25 MG/ML IJ SOLN: 6.2500 mg | INTRAMUSCULAR | Status: DC | PRN

## 2017-04-14 MED ORDER — OXYCODONE HCL 5 MG/5ML PO SOLN: 5.0000 mg | Freq: Once | ORAL | Status: DC | PRN

## 2017-04-14 MED ORDER — LIDOCAINE HCL (CARDIAC) 20 MG/ML IV SOLN
INTRAVENOUS | Status: DC | PRN
  Administered 2017-04-14: 50 mg via INTRAVENOUS

## 2017-04-14 MED ORDER — STERILE WATER FOR IRRIGATION IR SOLN
Status: DC | PRN
  Administered 2017-04-14: 09:00:00

## 2017-04-14 MED ORDER — LACTATED RINGERS IV SOLN
10.0000 mL/h | INTRAVENOUS | Status: DC
  Administered 2017-04-14: 10 mL/h via INTRAVENOUS

## 2017-04-14 SURGICAL SUPPLY — 23 items
CANISTER SUCT 1200ML W/VALVE (MISCELLANEOUS) ×2 IMPLANT
CLIP HMST 235XBRD CATH ROT (MISCELLANEOUS) IMPLANT
CLIP RESOLUTION 360 11X235 (MISCELLANEOUS)
FCP ESCP3.2XJMB 240X2.8X (MISCELLANEOUS)
FORCEPS BIOP RAD 4 LRG CAP 4 (CUTTING FORCEPS) IMPLANT
FORCEPS BIOP RJ4 240 W/NDL (MISCELLANEOUS)
FORCEPS ESCP3.2XJMB 240X2.8X (MISCELLANEOUS) IMPLANT
GOWN CVR UNV OPN BCK APRN NK (MISCELLANEOUS) ×2 IMPLANT
GOWN ISOL THUMB LOOP REG UNIV (MISCELLANEOUS) ×4
INJECTOR VARIJECT VIN23 (MISCELLANEOUS) IMPLANT
KIT DEFENDO VALVE AND CONN (KITS) IMPLANT
KIT ENDO PROCEDURE OLY (KITS) ×2 IMPLANT
MARKER SPOT ENDO TATTOO 5ML (MISCELLANEOUS) IMPLANT
PAD GROUND ADULT SPLIT (MISCELLANEOUS) IMPLANT
PROBE APC STR FIRE (PROBE) IMPLANT
RETRIEVER NET ROTH 2.5X230 LF (MISCELLANEOUS) IMPLANT
SNARE SHORT THROW 13M SML OVAL (MISCELLANEOUS) IMPLANT
SNARE SHORT THROW 30M LRG OVAL (MISCELLANEOUS) IMPLANT
SNARE SNG USE RND 15MM (INSTRUMENTS) IMPLANT
SPOT EX ENDOSCOPIC TATTOO (MISCELLANEOUS)
TRAP ETRAP POLY (MISCELLANEOUS) IMPLANT
VARIJECT INJECTOR VIN23 (MISCELLANEOUS)
WATER STERILE IRR 250ML POUR (IV SOLUTION) ×2 IMPLANT

## 2017-04-14 NOTE — Anesthesia Procedure Notes (Signed)
Performed by: [place] Latulippe Pre-anesthesia Checklist: Patient identified, Emergency Drugs available, Suction available, Timeout performed and Patient being monitored Patient Re-evaluated:Patient Re-evaluated prior to induction Oxygen Delivery Method: Nasal cannula Placement Confirmation: positive ETCO2       

## 2017-04-14 NOTE — Anesthesia Preprocedure Evaluation (Signed)
Anesthesia Evaluation  Patient identified by MRN, date of birth, ID band Patient awake    Reviewed: Allergy & Precautions, H&P , NPO status , Patient's Chart, lab work & pertinent test results, reviewed documented beta blocker date and time   Airway Mallampati: II  TM Distance: >3 FB Neck ROM: full    Dental  (+) Partial Upper   Pulmonary shortness of breath,    Pulmonary exam normal breath sounds clear to auscultation       Cardiovascular Exercise Tolerance: Good negative cardio ROS   Rhythm:regular Rate:Normal     Neuro/Psych negative neurological ROS  negative psych ROS   GI/Hepatic negative GI ROS, Neg liver ROS,   Endo/Other  Hypothyroidism   Renal/GU negative Renal ROS  negative genitourinary   Musculoskeletal   Abdominal   Peds  Hematology negative hematology ROS (+)   Anesthesia Other Findings   Reproductive/Obstetrics negative OB ROS                             Anesthesia Physical Anesthesia Plan  ASA: II  Anesthesia Plan: General   Post-op Pain Management:    Induction:   PONV Risk Score and Plan: Propofol infusion  Airway Management Planned:   Additional Equipment:   Intra-op Plan:   Post-operative Plan:   Informed Consent: I have reviewed the patients History and Physical, chart, labs and discussed the procedure including the risks, benefits and alternatives for the proposed anesthesia with the patient or authorized representative who has indicated his/her understanding and acceptance.   Dental Advisory Given  Plan Discussed with: CRNA  Anesthesia Plan Comments:         Anesthesia Quick Evaluation

## 2017-04-14 NOTE — Transfer of Care (Signed)
Immediate Anesthesia Transfer of Care Note  Patient: Stacey Cline  Procedure(s) Performed: Procedure(s): COLONOSCOPY WITH PROPOFOL (N/A)  Patient Location: PACU  Anesthesia Type: General  Level of Consciousness: awake, alert  and patient cooperative  Airway and Oxygen Therapy: Patient Spontanous Breathing and Patient connected to supplemental oxygen  Post-op Assessment: Post-op Vital signs reviewed, Patient's Cardiovascular Status Stable, Respiratory Function Stable, Patent Airway and No signs of Nausea or vomiting  Post-op Vital Signs: Reviewed and stable  Complications: No apparent anesthesia complications

## 2017-04-14 NOTE — Anesthesia Postprocedure Evaluation (Signed)
Anesthesia Post Note  Patient: Stacey Cline  Procedure(s) Performed: Procedure(s) (LRB): COLONOSCOPY WITH PROPOFOL (N/A)  Patient location during evaluation: PACU Anesthesia Type: General Level of consciousness: awake and alert Pain management: pain level controlled Vital Signs Assessment: post-procedure vital signs reviewed and stable Respiratory status: spontaneous breathing, nonlabored ventilation, respiratory function stable and patient connected to nasal cannula oxygen Cardiovascular status: blood pressure returned to baseline and stable Postop Assessment: no signs of nausea or vomiting Anesthetic complications: no    Sephira Zellman ELAINE

## 2017-04-14 NOTE — Op Note (Signed)
[facility] Patient Name: Stacey Cline Procedure Date: 04/14/2017 8:35 AM MRN: [id number] Account #: [id number] Date of Birth: 06/06/1956 Admit Type: Outpatient Age: 61 Room: [facility] OR ROOM 01 Gender: Female Note Status: Finalized Procedure:            Colonoscopy Indications:          Screening for colorectal malignant neoplasm Providers:            Lucilla Lame MD, MD Referring MD:         [name]. Tamala Julian, MD (Referring MD) Medicines:            Propofol per Anesthesia Complications:        No immediate complications. Procedure:            Pre-Anesthesia Assessment:                       - Prior to the procedure, a History and Physical was                        performed, and patient medications and allergies were                        reviewed. The patient's tolerance of previous                        anesthesia was also reviewed. The risks and benefits of                        the procedure and the sedation options and risks were                        discussed with the patient. All questions were                        answered, and informed consent was obtained. Prior                        Anticoagulants: The patient has taken no previous                        anticoagulant or antiplatelet agents. ASA Grade                        Assessment: II - A patient with mild systemic disease.                        After reviewing the risks and benefits, the patient was                        deemed in satisfactory condition to undergo the                        procedure.                       After obtaining informed consent, the colonoscope was                        passed under direct vision. Throughout the procedure,  the patient's blood pressure, pulse, and oxygen                        saturations were monitored continuously. The Morris [id number]) was introduced through  the                        anus and advanced to the the cecum, identified by                        appendiceal orifice and ileocecal valve. The                        colonoscopy was performed without difficulty. The                        patient tolerated the procedure well. The quality of                        the bowel preparation was excellent. Findings:      The perianal and digital rectal examinations were normal.      A few small-mouthed diverticula were found in the ascending colon and       cecum.      Non-bleeding internal hemorrhoids were found during retroflexion. The       hemorrhoids were Grade I (internal hemorrhoids that do not prolapse). Impression:           - Diverticulosis in the ascending colon and in the                        cecum.                       - Non-bleeding internal hemorrhoids.                       - No specimens collected. Recommendation:       - Discharge patient to home.                       - Resume previous diet.                       - Continue present medications.                       - Repeat colonoscopy in 10 years for screening unless                        any change in family history or lower GI problems. Procedure Code(s):    --- Professional ---                       [phone], Colonoscopy, flexible; diagnostic, including                        collection of specimen(s) by brushing or washing, when                        performed (separate procedure) Diagnosis Code(s):    ---  Professional ---                       Z12.11, Encounter for screening for malignant neoplasm                        of colon CPT copyright 2016 [employer]. All rights reserved. The codes documented in this report are preliminary and upon coder review may  be revised to meet current compliance requirements. Lucilla Lame MD, MD 04/14/2017 8:55:13 AM This report has been signed electronically. Number of Addenda: 0 Note Initiated On: 04/14/2017  8:35 AM Scope Withdrawal Time: 0 hours 6 minutes 56 seconds  Total Procedure Duration: 0 hours 8 minutes 59 seconds       [facility]

## 2017-04-14 NOTE — H&P (Signed)
Stacey Lame, MD [facility] [address] [place], [place] [address] Phone: [phone] Fax : [phone]  Primary Care Physician:  Wardell Honour, MD Primary Gastroenterologist:  Dr. Allen Norris  Pre-Procedure History & Physical: HPI:  Stacey Cline is a 61 y.o. female is here for a screening colonoscopy.   Past Medical History:  Diagnosis Date  . Candidiasis of vulva and vagina   . Dyspnea   . Leiomyoma of uterus, unspecified   . Menopause age 77  . Pain in joint, multiple sites   . Pain in joint, shoulder region   . Papanicolaou smear of cervix with atypical squamous cells of undetermined significance (ASC-US)   . Postsurgical hypothyroidism   . Pure hypercholesterolemia   . PVCs (premature ventricular contractions)   . Unspecified vitamin D deficiency   . Ventral hernia, unspecified, without mention of obstruction or gangrene   . Wears dentures    partial upper    Past Surgical History:  Procedure Laterality Date  . BREAST BIOPSY  2006   Right  Benign  . CARDIAC CATHETERIZATION N/A 03/06/2016   Procedure: Right/Left Heart Cath and Coronary Angiography;  Surgeon: Wellington Hampshire, MD;  Location: [place] [facility];  Service: Cardiovascular;  Laterality: N/A;  . COSMETIC SURGERY     feet  . TONSILLECTOMY AND ADENOIDECTOMY  1960's  . TOTAL THYROIDECTOMY  2008    Prior to Admission medications   Medication Sig Start Date End Date Taking? Authorizing Provider  flecainide (TAMBOCOR) 100 MG tablet Take one tablet (100 mg) by mouth twice daily 07/03/16  Yes Deboraha Sprang, MD  hydrochlorothiazide (HYDRODIURIL) 12.5 MG tablet Take 1 tablet (12.5 mg total) by mouth daily. 12/31/16  Yes Wardell Honour, MD  levothyroxine (SYNTHROID, LEVOTHROID) 88 MCG tablet Take 88 mcg by mouth daily. 01/02/16  Yes [provider]  simvastatin (ZOCOR) 20 MG tablet Take 1 tablet (20 mg total) by mouth daily at 6 PM. 06/11/16  Yes Wardell Honour, MD  diltiazem (CARDIZEM CD) 120  MG 24 hr capsule TAKE 1 CAPSULE (120 MG TOTAL) BY MOUTH DAILY. 12/12/16   [provider]  ketoconazole (NIZORAL) 2 % cream Apply 1 application topically 2 (two) times daily. Patient not taking: Reported on 04/08/2017 06/11/16   Wardell Honour, MD    Allergies as of 02/03/2017 - Review Complete 12/31/2016  Allergen Reaction Noted  . Fish allergy Hives and Itching 06/03/2013  . Isovue [iopamidol] Hives 02/09/2016  . Penicillins Rash 04/28/2013  . Shellfish allergy Hives and Rash 03/05/2016    Family History  Problem Relation Age of Onset  . Heart disease Father        CAD/3  AMI in 78's   . Stroke Father        several  . Lung disease Father   . Hypertension Mother   . Diabetes Mother   . Heart disease Mother 85       AMI several; first unsure age.  Pacemaker  . Hyperlipidemia Mother   . Kidney failure Mother   . Diabetes Sister   . Cancer Sister 36       Breast cancer  . Hypertension Brother   . Stroke Sister   . Hypertension Sister   . Breast cancer Unknown   . Hypertension Unknown   . Diabetes type II Unknown     Social History   Social History  . Marital status: Married    Spouse name: N/A  . Number of children: 0  .  Years of education: college   Occupational History  . [employer]   Social History Main Topics  . Smoking status: Never Smoker  . Smokeless tobacco: Never Used  . Alcohol use No  . Drug use: No  . Sexual activity: Yes   Other Topics Concern  . Not on file   Social History Narrative   Married x 15 years; Happily, no abuse. Husband is a Theme park manager.       Children: none      Lives: with husband.      Employment:  Retire 06/2012 from [employer] after 35 years; wants to volunteer.      Tobacco:        Alcohol:   Caffeine use: Moderate amount.       Exercise: gym three days per week.         Review of Systems: See HPI, otherwise negative ROS  Physical Exam: BP (!) 145/79   Pulse 87   Temp (!) 97.2 F (36.2 C) (Tympanic)    Resp 16   Ht 5\' 6"  (1.676 m)   Wt 187 lb (84.8 kg)   SpO2 98%   BMI 30.18 kg/m  General:   Alert,  pleasant and cooperative in NAD Head:  Normocephalic and atraumatic. Neck:  Supple; no masses or thyromegaly. Lungs:  Clear throughout to auscultation.    Heart:  Regular rate and rhythm. Abdomen:  Soft, nontender and nondistended. Normal bowel sounds, without guarding, and without rebound.   Neurologic:  Alert and  oriented x4;  grossly normal neurologically.  Impression/Plan: Stacey Cline is now here to undergo a screening colonoscopy.  Risks, benefits, and alternatives regarding colonoscopy have been reviewed with the patient.  Questions have been answered.  All parties agreeable.

## 2017-04-15 ENCOUNTER — Encounter: Payer: Self-pay | Admitting: Gastroenterology

## 2017-06-24 ENCOUNTER — Ambulatory Visit: Payer: BLUE CROSS/BLUE SHIELD | Admitting: Family Medicine

## 2017-07-03 ENCOUNTER — Other Ambulatory Visit: Payer: Self-pay | Admitting: Family Medicine

## 2017-07-14 ENCOUNTER — Other Ambulatory Visit: Payer: Self-pay

## 2017-07-14 ENCOUNTER — Encounter: Payer: Self-pay | Admitting: Family Medicine

## 2017-07-14 ENCOUNTER — Ambulatory Visit: Payer: BLUE CROSS/BLUE SHIELD | Admitting: Family Medicine

## 2017-07-14 VITALS — BP 126/78 | HR 78 | Temp 97.8°F | Wt 193.8 lb

## 2017-07-14 DIAGNOSIS — I10 Essential (primary) hypertension: Secondary | ICD-10-CM | POA: Diagnosis not present

## 2017-07-14 DIAGNOSIS — R7302 Impaired glucose tolerance (oral): Secondary | ICD-10-CM | POA: Diagnosis not present

## 2017-07-14 DIAGNOSIS — E7800 Pure hypercholesterolemia, unspecified: Secondary | ICD-10-CM | POA: Diagnosis not present

## 2017-07-14 DIAGNOSIS — E663 Overweight: Secondary | ICD-10-CM | POA: Diagnosis not present

## 2017-07-14 DIAGNOSIS — I493 Ventricular premature depolarization: Secondary | ICD-10-CM

## 2017-07-14 DIAGNOSIS — E034 Atrophy of thyroid (acquired): Secondary | ICD-10-CM

## 2017-07-14 MED ORDER — HYDROCHLOROTHIAZIDE 12.5 MG PO TABS: 12.5000 mg | ORAL_TABLET | Freq: Every day | ORAL | 1 refills | Status: DC

## 2017-07-14 MED ORDER — SIMVASTATIN 20 MG PO TABS: 20.0000 mg | ORAL_TABLET | Freq: Every day | ORAL | 3 refills | Status: DC

## 2017-07-14 NOTE — Progress Notes (Signed)
Subjective:    Patient ID: Stacey Cline, female    DOB: April 27, 1956, 61 y.o.   MRN: [id number]  07/14/2017  Follow-up (6 month f/u); Hyperlipidemia; and Hypothyroidism    HPI This 61 y.o. female presents for six month follow-up hypercholesterolemia, glucose intolerance, hypertension,  and hypothyroidism.  Patient reports good compliance with medication, good tolerance to medication, and good symptom control.  Blood pressure is doing well with HCTZ.    PVCs with bigeminy: Cardiologist Stopped one medication at last visit.  Still suffering with DOE.  No improvement with DOE with cessation of medication.  S/p extensive work up.  If goes places, can walk all day if walking flat.  Cannot walk steps at church or in the house.  Cruise ship steps are difficult.  Learning to live with it.  Not exercising; however,  not happy with weight.  Starting to exercise this week.  Hernia interfere with sit ups.  Down loaded exercises.    Mom is really sick; caring for mother twice weekly; using mother's machine; never under 140-170.     BP Readings from Last 3 Encounters:  07/14/17 126/78  04/14/17 121/80  03/13/17 128/84   Wt Readings from Last 3 Encounters:  07/14/17 193 lb 12.8 oz (87.9 kg)  04/14/17 187 lb (84.8 kg)  03/13/17 197 lb 4 oz (89.5 kg)   Immunization History  Administered Date(s) Administered  . Hepatitis B 09/19/2004  . Td 08/19/2006  . Tdap 12/31/2016    Review of Systems  Constitutional: Negative for chills, diaphoresis, fatigue and fever.  Eyes: Negative for visual disturbance.  Respiratory: Positive for shortness of breath. Negative for cough and wheezing.   Cardiovascular: Negative for chest pain, palpitations and leg swelling.  Gastrointestinal: Negative for abdominal pain, constipation, diarrhea, nausea and vomiting.  Endocrine: Negative for cold intolerance, heat intolerance, polydipsia, polyphagia and polyuria.  Neurological: Negative for dizziness, tremors,  seizures, syncope, facial asymmetry, speech difficulty, weakness, light-headedness, numbness and headaches.  Psychiatric/Behavioral: Negative for decreased concentration, dysphoric mood and sleep disturbance. The patient is not nervous/anxious.     Past Medical History:  Diagnosis Date  . Dyspnea   . Hypertension   . Leiomyoma of uterus, unspecified   . Menopause age 21  . Papanicolaou smear of cervix with atypical squamous cells of undetermined significance (ASC-US)   . Postsurgical hypothyroidism   . Pure hypercholesterolemia   . PVCs (premature ventricular contractions)   . Unspecified vitamin D deficiency   . Ventral hernia, unspecified, without mention of obstruction or gangrene   . Wears dentures    partial upper   Past Surgical History:  Procedure Laterality Date  . BREAST BIOPSY  2006   Right  Benign  . CARDIAC CATHETERIZATION N/A 03/06/2016   Procedure: Right/Left Heart Cath and Coronary Angiography;  Surgeon: Wellington Hampshire, MD;  Location: [place] [facility];  Service: Cardiovascular;  Laterality: N/A;  . COLONOSCOPY WITH PROPOFOL N/A 04/14/2017   Procedure: COLONOSCOPY WITH PROPOFOL;  Surgeon: Lucilla Lame, MD;  Location: [facility];  Service: Endoscopy;  Laterality: N/A;  . COSMETIC SURGERY     feet  . TONSILLECTOMY AND ADENOIDECTOMY  1960's  . TOTAL THYROIDECTOMY  2008   Allergies  Allergen Reactions  . Fish Allergy Hives and Itching  . Garlic Swelling    angioedema  . Isovue [Iopamidol] Hives    Pt developed 1 hive on her chest and 2 on her neck area appx 10 minutes post contrast injection. Given 50 mg oral benadryl.  Needs 13 hour pre meds in the future.   Marland Kitchen Penicillins Rash    Has patient had a PCN reaction causing immediate rash, facial/tongue/throat swelling, SOB or lightheadedness with hypotension: Yes Has patient had a PCN reaction causing severe rash involving mucus membranes or skin necrosis: No Has patient had a PCN reaction that required  hospitalization No Has patient had a PCN reaction occurring within the last 10 years: Yes If all of the above answers are "NO", then may proceed with Cephalosporin use.   . Shellfish Allergy Hives and Rash   Current Outpatient Medications on File Prior to Visit  Medication Sig Dispense Refill  . flecainide (TAMBOCOR) 100 MG tablet Take one tablet (100 mg) by mouth twice daily 180 tablet 3  . levothyroxine (SYNTHROID, LEVOTHROID) 88 MCG tablet Take 88 mcg by mouth daily.  0   No current facility-administered medications on file prior to visit.    Social History   Socioeconomic History  . Marital status: Married    Spouse name: Not on file  . Number of children: 0  . Years of education: college  . Highest education level: Not on file  Social Needs  . Financial resource strain: Not on file  . Food insecurity - worry: Not on file  . Food insecurity - inability: Not on file  . Transportation needs - medical: Not on file  . Transportation needs - non-medical: Not on file  Occupational History  . Occupation: Ambulance person: [employer]  Tobacco Use  . Smoking status: Never Smoker  . Smokeless tobacco: Never Used  Substance and Sexual Activity  . Alcohol use: No  . Drug use: No  . Sexual activity: Yes  Other Topics Concern  . Not on file  Social History Narrative   Married x 15 years; Happily, no abuse. Husband is a Theme park manager.       Children: none      Lives: with husband.      Employment:  Retire 06/2012 from [employer] after 35 years; wants to volunteer.      Tobacco:        Alcohol:   Caffeine use: Moderate amount.       Exercise: gym three days per week.     Family History  Problem Relation Age of Onset  . Heart disease Father        CAD/3  AMI in 55's   . Stroke Father        several  . Lung disease Father   . Hypertension Mother   . Diabetes Mother   . Heart disease Mother 74       AMI several; first unsure age.  Pacemaker  . Hyperlipidemia Mother   . Kidney  failure Mother   . Diabetes Sister   . Cancer Sister 84       Breast cancer  . Hypertension Brother   . Stroke Sister   . Hypertension Sister   . Breast cancer Unknown   . Hypertension Unknown   . Diabetes type II Unknown        Objective:    BP 126/78 (BP Location: Left Arm, Patient Position: Sitting, Cuff Size: Large)   Pulse 78   Temp 97.8 F (36.6 C) (Oral)   Wt 193 lb 12.8 oz (87.9 kg)   SpO2 91%   BMI 31.28 kg/m  Physical Exam  Constitutional: She is oriented to person, place, and time. She appears well-developed and well-nourished. No distress.  HENT:  Head:  Normocephalic and atraumatic.  Right Ear: External ear normal.  Left Ear: External ear normal.  Nose: Nose normal.  Mouth/Throat: Oropharynx is clear and moist.  Eyes: Conjunctivae and EOM are normal. Pupils are equal, round, and reactive to light.  Neck: Normal range of motion. Neck supple. Carotid bruit is not present. No thyromegaly present.  Cardiovascular: Normal rate, regular rhythm, normal heart sounds and intact distal pulses. Exam reveals no gallop and no friction rub.  No murmur heard. Pulmonary/Chest: Effort normal and breath sounds normal. She has no wheezes. She has no rales.  Abdominal: Soft. Bowel sounds are normal. She exhibits no distension and no mass. There is no tenderness. There is no rebound and no guarding.  Lymphadenopathy:    She has no cervical adenopathy.  Neurological: She is alert and oriented to person, place, and time. No cranial nerve deficit.  Skin: Skin is warm and dry. No rash noted. She is not diaphoretic. No erythema. No pallor.  Psychiatric: She has a normal mood and affect. Her behavior is normal.   No results found. Depression screen [facility] 2/9 07/14/2017 12/31/2016 06/11/2016 02/07/2016 11/28/2015  Decreased Interest 0 0 0 0 0  Down, Depressed, Hopeless 0 0 0 0 0  PHQ - 2 Score 0 0 0 0 0   Fall Risk  07/14/2017 12/31/2016 06/11/2016 02/07/2016 02/07/2016  Falls in the past  year? No No No No No        Assessment & Plan:   1. Pure hypercholesterolemia   2. Glucose intolerance (impaired glucose tolerance)   3. Hypothyroidism due to acquired atrophy of thyroid   4. PVC's -- frequent with bigeminy   5. Overweight (BMI 25.0-29.9)   6. Essential hypertension     Controlled hypertension and hypercholesterolemia.  Obtain labs for chronic disease management.  Refills provided.  No change to management at this time.  Continues to suffer with frequent bigeminy.  Continues to have dyspnea on exertion.  His undergone extensive evaluation including cardiology and pulmonology evaluations.  Status post cardiac catheterization and CT Angio  to evaluate for dyspnea.  Adjustments in medications of been made as well.  Close follow-up with cardiology.  Glucose intolerance be ideally controlled with weight loss, exercise, low carbohydrate food choices.  -recommend weight loss, exercise for 30-60 minutes five days per week; recommend 1200 kcal restriction per day with a minimum of 60 grams of protein per day.     Orders Placed This Encounter  Procedures  . CBC with Differential/Platelet  . Comprehensive metabolic panel    Order Specific Question:   Has the patient fasted?    Answer:   No  . Hemoglobin A1c  . Lipid panel    Order Specific Question:   Has the patient fasted?    Answer:   No   Meds ordered this encounter  Medications  . simvastatin (ZOCOR) 20 MG tablet    Sig: Take 1 tablet (20 mg total) by mouth daily at 6 PM.    Dispense:  90 tablet    Refill:  3  . hydrochlorothiazide (HYDRODIURIL) 12.5 MG tablet    Sig: Take 1 tablet (12.5 mg total) by mouth daily.    Dispense:  90 tablet    Refill:  1    Return in about 6 months (around 01/11/2018) for complete physical examiniation.   Stacey Cline Elayne Guerin, M.D. Primary Care at [facility] [address] [place], [place]  [address] [phone] phone [phone])  [phone] fax

## 2017-07-14 NOTE — Patient Instructions (Signed)
     IF you received an x-ray today, you will receive an invoice from [facility]. Please contact [facility] at [phone] with questions or concerns regarding your invoice.   IF you received labwork today, you will receive an invoice from [facility]. Please contact [facility] at [phone] with questions or concerns regarding your invoice.   Our billing staff will not be able to assist you with questions regarding bills from these companies.  You will be contacted with the lab results as soon as they are available. The fastest way to get your results is to activate your My Chart account. Instructions are located on the last page of this paperwork. If you have not heard from us regarding the results in 2 weeks, please contact this office.     

## 2017-07-15 LAB — COMPREHENSIVE METABOLIC PANEL
ALT: 26 IU/L (ref 0–32)
AST: 24 IU/L (ref 0–40)
Albumin/Globulin Ratio: 1.5 (ref 1.2–2.2)
Albumin: 4.3 g/dL (ref 3.6–4.8)
Alkaline Phosphatase: 59 IU/L (ref 39–117)
BILIRUBIN TOTAL: 0.4 mg/dL (ref 0.0–1.2)
BUN/Creatinine Ratio: 13 (ref 12–28)
BUN: 12 mg/dL (ref 8–27)
CHLORIDE: 104 mmol/L (ref 96–106)
CO2: 26 mmol/L (ref 20–29)
Calcium: 9.3 mg/dL (ref 8.7–10.3)
Creatinine, Ser: 0.95 mg/dL (ref 0.57–1.00)
GFR calc non Af Amer: 64 mL/min/{1.73_m2} (ref >59)
GFR, EST AFRICAN AMERICAN: 74 mL/min/{1.73_m2} (ref >59)
GLUCOSE: 84 mg/dL (ref 65–99)
Globulin, Total: 2.8 g/dL (ref 1.5–4.5)
Potassium: 4 mmol/L (ref 3.5–5.2)
Sodium: 142 mmol/L (ref 134–144)
TOTAL PROTEIN: 7.1 g/dL (ref 6.0–8.5)

## 2017-07-15 LAB — CBC WITH DIFFERENTIAL/PLATELET
BASOS ABS: 0 10*3/uL (ref 0.0–0.2)
Basos: 0 %
EOS (ABSOLUTE): 0.1 10*3/uL (ref 0.0–0.4)
Eos: 1 %
Hematocrit: 42.1 % (ref 34.0–46.6)
Hemoglobin: 13.6 g/dL (ref 11.1–15.9)
IMMATURE GRANS (ABS): 0 10*3/uL (ref 0.0–0.1)
IMMATURE GRANULOCYTES: 0 %
LYMPHS: 38 %
Lymphocytes Absolute: 2.2 10*3/uL (ref 0.7–3.1)
MCH: 27.5 pg (ref 26.6–33.0)
MCHC: 32.3 g/dL (ref 31.5–35.7)
MCV: 85 fL (ref 79–97)
Monocytes Absolute: 0.5 10*3/uL (ref 0.1–0.9)
Monocytes: 9 %
NEUTROS PCT: 52 %
Neutrophils Absolute: 3 10*3/uL (ref 1.4–7.0)
PLATELETS: 261 10*3/uL (ref 150–379)
RBC: 4.94 x10E6/uL (ref 3.77–5.28)
RDW: 15.5 % — AB (ref 12.3–15.4)
WBC: 5.8 10*3/uL (ref 3.4–10.8)

## 2017-07-15 LAB — LIPID PANEL
CHOL/HDL RATIO: 4 ratio (ref 0.0–4.4)
Cholesterol, Total: 205 mg/dL — ABNORMAL HIGH (ref 100–199)
HDL: 51 mg/dL (ref >39)
LDL Calculated: 123 mg/dL — ABNORMAL HIGH (ref 0–99)
TRIGLYCERIDES: 154 mg/dL — AB (ref 0–149)
VLDL Cholesterol Cal: 31 mg/dL (ref 5–40)

## 2017-07-15 LAB — HEMOGLOBIN A1C
Est. average glucose Bld gHb Est-mCnc: 126 mg/dL
HEMOGLOBIN A1C: 6 % — AB (ref 4.8–5.6)

## 2017-07-17 ENCOUNTER — Encounter: Payer: Self-pay | Admitting: Family Medicine

## 2017-07-17 DIAGNOSIS — I10 Essential (primary) hypertension: Secondary | ICD-10-CM | POA: Insufficient documentation

## 2017-07-25 ENCOUNTER — Telehealth: Payer: Self-pay | Admitting: Family Medicine

## 2017-07-25 NOTE — Telephone Encounter (Signed)
Copied from [place]. Topic: Quick Communication - See Telephone Encounter >> Jul 25, 2017  9:47 AM Boyd Kerbs wrote: CRM for notification. See Telephone encounter for:  Patient is checking on Lab results Do Not see a CRM to release results 07/25/17.

## 2017-07-31 NOTE — Telephone Encounter (Signed)
Provider, please advise regarding 07/14/17 lab results. Thanks!

## 2017-08-01 MED ORDER — SIMVASTATIN 40 MG PO TABS
40.0000 mg | ORAL_TABLET | Freq: Every day | ORAL | 1 refills | Status: DC
Start: 2017-08-01 — End: 2017-08-22

## 2017-08-01 NOTE — Telephone Encounter (Signed)
Call --- 1. Cholesterol remains elevated; I recommend increasing Simvastatin to 40mg  daily.  2.  Three month average of sugars remain in PREDIABETIC RANGE.   I recommend weight loss, exercise, and low-carbohydrate low-sugar food choices. You should AVOID: regular sodas, sweetened tea, fruit juices.  You should LIMIT: breads, pastas, rice, potatoes, and desserts/sweets.  I would recommend limiting your total carbohydrate intake per meal to 45 grams; I would limit your total carbohydrate intake per snack to 30 grams.  I would also have a goal of 60 grams of protein intake per day; this would equal 10-15 grams of protein per meal and 5-10 grams of protein per snack.  3.  Liver and kidney functions are normal.

## 2017-08-02 NOTE — Telephone Encounter (Signed)
Phone call to patient. Relayed recommendations from Dr. Tamala Julian. Patient agreeable, no further questions.

## 2017-08-07 ENCOUNTER — Telehealth: Payer: Self-pay | Admitting: Family Medicine

## 2017-08-07 NOTE — Telephone Encounter (Signed)
Copied from [place] [id number]. Topic: Quick Communication - Rx Refill/Question >> Aug 07, 2017 12:14 PM Oliver Pila B wrote: Pt called to clarify if she is needing to take the simvastatin Rx @ 40mg  b/c her heart doctor changed it to 20mg  b/c of the Tambocor that she is taking, contact pt to advise if its safe to go back to 40mg  of simvastatin,

## 2017-08-15 NOTE — Telephone Encounter (Signed)
Call --- Cholesterol is not controlled on 20mg  of Simvastatin.  Thus, I recommend she switch to Atorvastatin or Crestor which is more effective than Simvastatin.  Please let me know if patient is comfortable with this switch.

## 2017-08-16 NOTE — Telephone Encounter (Signed)
Called pt. She is concerned that she may have an interaction with the flecanide because she was changed from 40mg  to 20mg  of simvastatin by her cardiologist. She does not care what she switches to but wants to make sure that these medicines will cause the same concern as the 40mg  of simvastatin.

## 2017-08-22 MED ORDER — ATORVASTATIN CALCIUM 20 MG PO TABS: 20.0000 mg | ORAL_TABLET | Freq: Every day | ORAL | 1 refills | Status: DC

## 2017-08-22 NOTE — Telephone Encounter (Signed)
Left voicemail on patient's cell phone. Advised that I will leave a detailed myChart message.

## 2017-10-03 ENCOUNTER — Other Ambulatory Visit: Payer: Self-pay | Admitting: *Deleted

## 2017-10-03 ENCOUNTER — Telehealth: Payer: Self-pay | Admitting: Internal Medicine

## 2017-10-03 MED ORDER — FLECAINIDE ACETATE 100 MG PO TABS: ORAL_TABLET | ORAL | 1 refills | Status: DC

## 2017-10-03 NOTE — Telephone Encounter (Signed)
Requested Prescriptions   Signed Prescriptions Disp Refills  . flecainide (TAMBOCOR) 100 MG tablet 60 tablet 1    Sig: Take one tablet (100 mg) by mouth twice daily    Authorizing Provider: Deboraha Sprang    Ordering User: Britt Bottom

## 2017-10-03 NOTE — Telephone Encounter (Signed)
°*  STAT* If patient is at the pharmacy, call can be transferred to refill team.   1. Which medications need to be refilled? (please list name of each medication and dose if known) Flecainide 100 mg po BID   2. Which pharmacy/location (including street and city if local pharmacy) is medication to be sent to?[facility]    3. Do they need a 30 day or 90 day supply? [place]

## 2017-11-30 ENCOUNTER — Other Ambulatory Visit: Payer: Self-pay | Admitting: Internal Medicine

## 2017-12-15 ENCOUNTER — Telehealth: Payer: Self-pay | Admitting: Family Medicine

## 2017-12-15 NOTE — Telephone Encounter (Signed)
ret'd call to pt.  Informed that last tetanus was given through Tdap on 12/31/2016.  Pt. verb. understanding.

## 2017-12-15 NOTE — Telephone Encounter (Signed)
This encounter was created in error - please disregard.

## 2017-12-15 NOTE — Telephone Encounter (Signed)
Message from Conception Chancy, NT sent at 12/15/2017 11:41 AM EDT   Patient appt is at [facility] and would like to know by then  ----- Message from Conception Chancy, NT sent at 12/15/2017 11:41 AM EDT ----- Patient states she is going to [place] and needs to know when she had her last tetanus shot. She states she is going to a clinic to get the injections today and needs to know if she needs this one.

## 2017-12-18 ENCOUNTER — Ambulatory Visit: Payer: BLUE CROSS/BLUE SHIELD | Admitting: Internal Medicine

## 2017-12-18 ENCOUNTER — Encounter: Payer: Self-pay | Admitting: Internal Medicine

## 2017-12-18 VITALS — BP 134/70 | HR 69 | Ht 66.0 in | Wt 193.0 lb

## 2017-12-18 DIAGNOSIS — I519 Heart disease, unspecified: Secondary | ICD-10-CM

## 2017-12-18 DIAGNOSIS — I493 Ventricular premature depolarization: Secondary | ICD-10-CM | POA: Diagnosis not present

## 2017-12-18 DIAGNOSIS — R0600 Dyspnea, unspecified: Secondary | ICD-10-CM

## 2017-12-18 DIAGNOSIS — R0609 Other forms of dyspnea: Secondary | ICD-10-CM

## 2017-12-18 NOTE — Progress Notes (Signed)
Patient Care Team: Wardell Honour, MD as PCP - General (Family Medicine)   HPI  Stacey Cline is a 62 y.o. 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 There have a somewhat unusual morphology with nondescript QRS in V1 and a superior axis.   There were frequent couplets and short runs on Holter monitoring. Burden was 41%.  They have persisted Despite calcium blockers. She was started her on flecainide.    Records and Results Reviewed  she saw Dr. Gaylyn Cheers in the interim. She was feeling significantly improved.  Repeat Holter monitor however demonstrated 37% PVCs   QRS morphology demonstrated significant fragmentation DATE TEST    8/17 Echo   EF 55 %   MR  Mild mod  9/17 MRI   EF normal     Normal without DE MR moderateHe of course   7/17  CATH     EF55-60%     Normal Cors   5/18 Echo EF 55-65%           DATE PR interval QRSduration Dose  6/17 136 86   0   11/17 180 98 100  Flec  6/18 178 88 100  5/19 166 98 100   She continues with scant PVCs.  She will continue to complain however of shortness of breath.  Most notable on steps.  Unassociated with chest discomfort.  PFTs were last done 2013.  Pulmonary note from 2017 was reviewed; it was impression at that time that the PVCs were the culprit.  No edema.  No lightheadedness.    Past Medical History:  Diagnosis Date  . Dyspnea   . Hypertension   . Leiomyoma of uterus, unspecified   . Menopause age 54  . Papanicolaou smear of cervix with atypical squamous cells of undetermined significance (ASC-US)   . Postsurgical hypothyroidism   . Pure hypercholesterolemia   . PVCs (premature ventricular contractions)   . Unspecified vitamin D deficiency   . Ventral hernia, unspecified, without mention of obstruction or gangrene   . Wears dentures    partial upper    Past Surgical History:  Procedure Laterality Date  . BREAST BIOPSY  2006   Right  Benign  . CARDIAC CATHETERIZATION  N/A 03/06/2016   Procedure: Right/Left Heart Cath and Coronary Angiography;  Surgeon: Wellington Hampshire, MD;  Location: [facility];  Service: Cardiovascular;  Laterality: N/A;  . COLONOSCOPY WITH PROPOFOL N/A 04/14/2017   Procedure: COLONOSCOPY WITH PROPOFOL;  Surgeon: Lucilla Lame, MD;  Location: [facility];  Service: Endoscopy;  Laterality: N/A;  . COSMETIC SURGERY     feet  . TONSILLECTOMY AND ADENOIDECTOMY  1960's  . TOTAL THYROIDECTOMY  2008    Current Outpatient Medications  Medication Sig Dispense Refill  . atorvastatin (LIPITOR) 20 MG tablet Take 1 tablet (20 mg total) by mouth daily. 90 tablet 1  . flecainide (TAMBOCOR) 100 MG tablet TAKE ONE TABLET (100 MG) BY MOUTH TWICE DAILY 60 tablet 0  . hydrochlorothiazide (HYDRODIURIL) 12.5 MG tablet Take 1 tablet (12.5 mg total) by mouth daily. 90 tablet 1  . levothyroxine (SYNTHROID, LEVOTHROID) 88 MCG tablet Take 88 mcg by mouth daily.  0   No current facility-administered medications for this visit.     Allergies  Allergen Reactions  . Fish Allergy Hives and Itching  . Garlic Swelling    angioedema  . Isovue [Iopamidol] Hives    Pt developed 1 hive on her chest  and 2 on her neck area appx 10 minutes post contrast injection. Given 50 mg oral benadryl.  Needs 13 hour pre meds in the future.   Marland Kitchen Penicillins Rash    Has patient had a PCN reaction causing immediate rash, facial/tongue/throat swelling, SOB or lightheadedness with hypotension: Yes Has patient had a PCN reaction causing severe rash involving mucus membranes or skin necrosis: No Has patient had a PCN reaction that required hospitalization No Has patient had a PCN reaction occurring within the last 10 years: Yes If all of the above answers are "NO", then may proceed with Cephalosporin use.   . Shellfish Allergy Hives and Rash      Review of Systems negative except from HPI and PMH  Physical Exam BP 134/70 (BP Location: Left Arm, Patient Position:  Sitting, Cuff Size: Normal)   Pulse 69   Ht 5\' 6"  (1.676 m)   Wt 193 lb (87.5 kg)   BMI 31.15 kg/m  Well developed and nourished in no acute distress HENT normal Neck supple with JVP-flat Clear Regular rate and rhythm, no murmurs or gallops Abd-soft with active BS No Clubbing cyanosis edema Skin-warm and dry A & Oriented  Grossly normal sensory and motor function   ECG sinus at 69 Intervals 17/10/32 Nonspecific T wave  Assessment and  Plan  PVCs-unusual morphology superior axis and a largely nondescript QRS morphology in lead V1- fragmented   Dyspnea on exertion  PVCs remain quiescent we will continue flecainide.  ECG does not show evidence of excessive QRS or PR prolongation.  Her dyspnea remains an issue.  There is no evidence of volume overload.  Review of her echocardiogram 5/18 demonstrated a normal E/E' suggesting that volume overload i.e. diastolic dysfunction is not the contributor either.  Hence, we will arrange for her to go back and see Dr. MR to see if he is here to further insights   She is traveling to [place] with her husband.  Encouraged her to take her antimalarials  We spent more than 50% of our >25 min visit in face to face counseling regarding the above    Current medicines are reviewed at length with the patient today .  The patient does not  have concerns regarding medicines.

## 2017-12-18 NOTE — Patient Instructions (Addendum)
Medication Instructions: - Your physician recommends that you continue on your current medications as directed. Please refer to the Current Medication list given to you today.  Labwork: - none ordered  Procedures/Testing: - none ordered  Follow-Up: - Your physician recommends that you schedule a follow-up appointment: with Dr. Chase Caller for shortness of breath  - Your physician wants you to follow-up in: 1 year with Dr. Caryl Comes. You will receive a reminder letter in the mail two months in advance. If you don't receive a letter, please call our office to schedule the follow-up appointment.   Any Additional Special Instructions Will Be Listed Below (If Applicable).     If you need a refill on your cardiac medications before your next appointment, please call your pharmacy.

## 2017-12-18 NOTE — Addendum Note (Signed)
Addended by: Alvis Lemmings C on: 12/18/2017 03:58 PM   Modules accepted: Orders

## 2017-12-31 ENCOUNTER — Other Ambulatory Visit: Payer: Self-pay | Admitting: Internal Medicine

## 2018-01-05 ENCOUNTER — Encounter: Payer: Self-pay | Admitting: Family Medicine

## 2018-01-05 ENCOUNTER — Other Ambulatory Visit: Payer: Self-pay

## 2018-01-05 ENCOUNTER — Ambulatory Visit (INDEPENDENT_AMBULATORY_CARE_PROVIDER_SITE_OTHER): Payer: BLUE CROSS/BLUE SHIELD | Admitting: Family Medicine

## 2018-01-05 VITALS — BP 132/80 | HR 64 | Temp 98.0°F | Resp 16 | Ht 65.95 in | Wt 189.8 lb

## 2018-01-05 DIAGNOSIS — I10 Essential (primary) hypertension: Secondary | ICD-10-CM

## 2018-01-05 DIAGNOSIS — Z6831 Body mass index (BMI) 31.0-31.9, adult: Secondary | ICD-10-CM | POA: Diagnosis not present

## 2018-01-05 DIAGNOSIS — Z1231 Encounter for screening mammogram for malignant neoplasm of breast: Secondary | ICD-10-CM

## 2018-01-05 DIAGNOSIS — Z124 Encounter for screening for malignant neoplasm of cervix: Secondary | ICD-10-CM

## 2018-01-05 DIAGNOSIS — E7800 Pure hypercholesterolemia, unspecified: Secondary | ICD-10-CM

## 2018-01-05 DIAGNOSIS — E6609 Other obesity due to excess calories: Secondary | ICD-10-CM

## 2018-01-05 DIAGNOSIS — R7302 Impaired glucose tolerance (oral): Secondary | ICD-10-CM

## 2018-01-05 DIAGNOSIS — Z1239 Encounter for other screening for malignant neoplasm of breast: Secondary | ICD-10-CM

## 2018-01-05 DIAGNOSIS — I493 Ventricular premature depolarization: Secondary | ICD-10-CM

## 2018-01-05 DIAGNOSIS — Z0000 Encounter for general adult medical examination without abnormal findings: Secondary | ICD-10-CM | POA: Diagnosis not present

## 2018-01-05 DIAGNOSIS — E034 Atrophy of thyroid (acquired): Secondary | ICD-10-CM

## 2018-01-05 DIAGNOSIS — E66811 Obesity, class 1: Secondary | ICD-10-CM

## 2018-01-05 LAB — POCT URINALYSIS DIP (MANUAL ENTRY)
Bilirubin, UA: NEGATIVE
Blood, UA: NEGATIVE
Glucose, UA: NEGATIVE mg/dL
Ketones, POC UA: NEGATIVE mg/dL
Leukocytes, UA: NEGATIVE
Nitrite, UA: NEGATIVE
PH UA: 5.5 (ref 5.0–8.0)
PROTEIN UA: NEGATIVE mg/dL
SPEC GRAV UA: 1.025 (ref 1.010–1.025)
UROBILINOGEN UA: 0.2 U/dL

## 2018-01-05 MED ORDER — HYDROCHLOROTHIAZIDE 12.5 MG PO TABS: 12.5000 mg | ORAL_TABLET | Freq: Every day | ORAL | 1 refills | Status: DC

## 2018-01-05 MED ORDER — ATORVASTATIN CALCIUM 20 MG PO TABS: 20.0000 mg | ORAL_TABLET | Freq: Every day | ORAL | 1 refills | Status: DC

## 2018-01-05 NOTE — Progress Notes (Signed)
Subjective:    Patient ID: Stacey Cline, female    DOB: 1955/09/16, 62 y.o.   MRN: [id number]  01/05/2018  Annual Exam    HPI This 62 y.o. female presents for COMPLETE PHYSICAL EXAMINATION. Management changes made last visit include the following: Controlled hypertension and hypercholesterolemia.  Obtain labs for chronic disease management.  Refills provided.  No change to management at this time.  Continues to suffer with frequent bigeminy.  Continues to have dyspnea on exertion.  His undergone extensive evaluation including cardiology and pulmonology evaluations.  Status post cardiac catheterization and CT Angio  to evaluate for dyspnea.  Adjustments in medications of been made as well.  Close follow-up with cardiology.  Glucose intolerance be ideally controlled with weight loss, exercise, low carbohydrate food choices.  -recommend weight loss, exercise for 30-60 minutes five days per week; recommend 1200 kcal restriction per day with a minimum of 60 grams of protein per day.   Last physical: Pap smear:  2016 Mammogram:  2015 Colonoscopy:  03-2017 Eye exam:  Several years. Dental exam:  Every 3-6 months.   Visual Acuity Screening   Right eye Left eye Both eyes  Without correction:     With correction: 20/20 20/20 20/20     BP Readings from Last 3 Encounters:  01/08/18 130/72  01/05/18 132/80  12/18/17 134/70   Wt Readings from Last 3 Encounters:  01/08/18 192 lb 6.4 oz (87.3 kg)  01/05/18 189 lb 12.8 oz (86.1 kg)  12/18/17 193 lb (87.5 kg)   Immunization History  Administered Date(s) Administered  . Hepatitis B 09/19/2004  . Td 08/19/2006  . Tdap 12/31/2016   Health Maintenance  Topic Date Due  . INFLUENZA VACCINE  03/19/2018  . MAMMOGRAM  01/01/2019  . PAP SMEAR  01/05/2021  . TETANUS/TDAP  01/01/2027  . COLONOSCOPY  04/15/2027  . Hepatitis C Screening  Completed  . HIV Screening  Completed    Travel to [place]: travel to [place].  Needs typhoid  oral vaccine every other day for 4 days.  Prescribed Doxycycline for malaria prophylaxis.   Review of Systems  Constitutional: Negative for activity change, appetite change, chills, diaphoresis, fatigue, fever and unexpected weight change.  HENT: Negative for congestion, dental problem, drooling, ear discharge, ear pain, facial swelling, hearing loss, mouth sores, nosebleeds, postnasal drip, rhinorrhea, sinus pressure, sneezing, sore throat, tinnitus, trouble swallowing and voice change.   Eyes: Negative for photophobia, pain, discharge, redness, itching and visual disturbance.  Respiratory: Negative for apnea, cough, choking, chest tightness, shortness of breath, wheezing and stridor.   Cardiovascular: Negative for chest pain, palpitations and leg swelling.  Gastrointestinal: Negative for abdominal distention, abdominal pain, anal bleeding, blood in stool, constipation, diarrhea, nausea, rectal pain and vomiting.  Endocrine: Negative for cold intolerance, heat intolerance, polydipsia, polyphagia and polyuria.  Genitourinary: Negative for decreased urine volume, difficulty urinating, dyspareunia, dysuria, enuresis, flank pain, frequency, genital sores, hematuria, menstrual problem, pelvic pain, urgency, vaginal bleeding, vaginal discharge and vaginal pain.       Nocturia x 1.  Urinary frequency; rare.   Musculoskeletal: Negative for arthralgias, back pain, gait problem, joint swelling, myalgias, neck pain and neck stiffness.  Skin: Negative for color change, pallor, rash and wound.  Allergic/Immunologic: Negative for environmental allergies, food allergies and immunocompromised state.  Neurological: Negative for dizziness, tremors, seizures, syncope, facial asymmetry, speech difficulty, weakness, light-headedness, numbness and headaches.  Hematological: Negative for adenopathy. Does not bruise/bleed easily.  Psychiatric/Behavioral: Negative for agitation, behavioral problems, confusion, decreased  concentration,  dysphoric mood, hallucinations, self-injury, sleep disturbance and suicidal ideas. The patient is not nervous/anxious and is not hyperactive.        12:00 bedtime; wakes up 10:00.    Past Medical History:  Diagnosis Date  . Dyspnea   . Hypertension   . Leiomyoma of uterus, unspecified   . Menopause age 73  . Papanicolaou smear of cervix with atypical squamous cells of undetermined significance (ASC-US)   . Postsurgical hypothyroidism   . Pure hypercholesterolemia   . PVCs (premature ventricular contractions)   . Unspecified vitamin D deficiency   . Ventral hernia, unspecified, without mention of obstruction or gangrene   . Wears dentures    partial upper   Past Surgical History:  Procedure Laterality Date  . BREAST BIOPSY  2006   Right  Benign  . CARDIAC CATHETERIZATION N/A 03/06/2016   Procedure: Right/Left Heart Cath and Coronary Angiography;  Surgeon: Wellington Hampshire, MD;  Location: [facility];  Service: Cardiovascular;  Laterality: N/A;  . COLONOSCOPY WITH PROPOFOL N/A 04/14/2017   Procedure: COLONOSCOPY WITH PROPOFOL;  Surgeon: Lucilla Lame, MD;  Location: [facility];  Service: Endoscopy;  Laterality: N/A;  . COSMETIC SURGERY     feet  . TONSILLECTOMY AND ADENOIDECTOMY  1960's  . TOTAL THYROIDECTOMY  2008   Allergies  Allergen Reactions  . Fish Allergy Hives and Itching  . Garlic Swelling    angioedema  . Isovue [Iopamidol] Hives    Pt developed 1 hive on her chest and 2 on her neck area appx 10 minutes post contrast injection. Given 50 mg oral benadryl.  Needs 13 hour pre meds in the future.   Marland Kitchen Penicillins Rash    Has patient had a PCN reaction causing immediate rash, facial/tongue/throat swelling, SOB or lightheadedness with hypotension: Yes Has patient had a PCN reaction causing severe rash involving mucus membranes or skin necrosis: No Has patient had a PCN reaction that required hospitalization No Has patient had a PCN reaction  occurring within the last 10 years: Yes If all of the above answers are "NO", then may proceed with Cephalosporin use.   . Shellfish Allergy Hives and Rash   Current Outpatient Medications on File Prior to Visit  Medication Sig Dispense Refill  . flecainide (TAMBOCOR) 100 MG tablet TAKE ONE TABLET (100 MG) BY MOUTH TWICE DAILY 60 tablet 6  . levothyroxine (SYNTHROID, LEVOTHROID) 75 MCG tablet Take 1 tablet (75 mcg) by mouth once daily on Saturday and Sunday    . levothyroxine (SYNTHROID, LEVOTHROID) 88 MCG tablet Take 1 tablet (88 mcg) by mouth once daily Monday through Friday     No current facility-administered medications on file prior to visit.    Social History   Socioeconomic History  . Marital status: Married    Spouse name: Not on file  . Number of children: 0  . Years of education: college  . Highest education level: Not on file  Occupational History  . Occupation: Ambulance person: [employer]  . Financial resource strain: Not on file  . Food insecurity:    Worry: Not on file    Inability: Not on file  . Transportation needs:    Medical: Not on file    Non-medical: Not on file  Tobacco Use  . Smoking status: Never Smoker  . Smokeless tobacco: Never Used  Substance and Sexual Activity  . Alcohol use: No  . Drug use: No  . Sexual activity: Yes  Birth control/protection: Post-menopausal  Lifestyle  . Physical activity:    Days per week: Not on file    Minutes per session: Not on file  . Stress: Not on file  Relationships  . Social connections:    Talks on phone: Not on file    Gets together: Not on file    Attends religious service: Not on file    Active member of club or organization: Not on file    Attends meetings of clubs or organizations: Not on file    Relationship status: Not on file  . Intimate partner violence:    Fear of current or ex partner: Not on file    Emotionally abused: Not on file    Physically abused: Not on file      Forced sexual activity: Not on file  Other Topics Concern  . Not on file  Social History Narrative   Marital status:  Married x 16 years; Happily, no abuse. Husband is a Theme park manager.       Children: none      Lives: with husband.      Employment:  Retire 06/2012 from [employer] after 35 years; wants to volunteer.      Tobacco:  never       Alcohol:  Rare       Drugs; none       Caffeine use: Moderate amount.       Exercise:  None in 2019.      ADLs: independent with ADLs.      Family History  Problem Relation Age of Onset  . Heart disease Father        CAD/3  AMI in 40's   . Stroke Father        several  . Lung disease Father   . Hypertension Mother   . Diabetes Mother   . Heart disease Mother 84       AMI several; first unsure age.  Pacemaker  . Hyperlipidemia Mother   . Kidney failure Mother   . Diabetes Sister   . Cancer Sister 6       Breast cancer  . Hypertension Brother   . Hypercholesterolemia Brother   . Stroke Sister   . Hypertension Sister   . Breast cancer Unknown   . Hypertension Unknown   . Diabetes type II Unknown        Objective:    BP 132/80   Pulse 64   Temp 98 F (36.7 C) (Oral)   Resp 16   Ht 5' 5.95" (1.675 m)   Wt 189 lb 12.8 oz (86.1 kg)   SpO2 98%   BMI 30.69 kg/m  Physical Exam  Constitutional: She is oriented to person, place, and time. She appears well-developed and well-nourished. No distress.  HENT:  Head: Normocephalic and atraumatic.  Right Ear: External ear normal.  Left Ear: External ear normal.  Nose: Nose normal.  Mouth/Throat: Oropharynx is clear and moist.  Eyes: Pupils are equal, round, and reactive to light. Conjunctivae and EOM are normal.  Neck: Normal range of motion and full passive range of motion without pain. Neck supple. No JVD present. Carotid bruit is not present. No thyromegaly present.  Cardiovascular: Normal rate, regular rhythm and normal heart sounds. Exam reveals no gallop and no friction rub.  No murmur  heard. Pulmonary/Chest: Effort normal and breath sounds normal. She has no wheezes. She has no rales. Right breast exhibits no inverted nipple, no mass, no nipple discharge, no skin change  and no tenderness. Left breast exhibits no inverted nipple, no mass, no nipple discharge, no skin change and no tenderness. No breast swelling, tenderness, discharge or bleeding. Breasts are symmetrical.  Abdominal: Soft. Bowel sounds are normal. She exhibits no distension and no mass. There is no tenderness. There is no rebound and no guarding.  Genitourinary: Vagina normal. No breast swelling, tenderness, discharge or bleeding. There is no rash, tenderness, lesion or injury on the right labia. There is no rash, tenderness, lesion or injury on the left labia. Uterus is enlarged. Uterus is not fixed and not tender. Cervix exhibits no motion tenderness, no discharge and no friability. Right adnexum displays no mass, no tenderness and no fullness. Left adnexum displays no mass, no tenderness and no fullness.  Musculoskeletal:       Right shoulder: Normal.       Left shoulder: Normal.       Cervical back: Normal.  Lymphadenopathy:    She has no cervical adenopathy.  Neurological: She is alert and oriented to person, place, and time. She has normal reflexes. No cranial nerve deficit. She exhibits normal muscle tone. Coordination normal.  Skin: Skin is warm and dry. No rash noted. She is not diaphoretic. No erythema. No pallor.  Psychiatric: She has a normal mood and affect. Her behavior is normal. Judgment and thought content normal.  Nursing note and vitals reviewed.  No results found. Depression screen [facility] 2/9 01/05/2018 07/14/2017 12/31/2016 06/11/2016 02/07/2016  Decreased Interest 0 0 0 0 0  Down, Depressed, Hopeless 0 0 0 0 0  PHQ - 2 Score 0 0 0 0 0   Fall Risk  01/05/2018 07/14/2017 12/31/2016 06/11/2016 02/07/2016  Falls in the past year? No No No No No        Assessment & Plan:   1. Routine physical  examination   2. Essential hypertension   3. Glucose intolerance (impaired glucose tolerance)   4. Hypothyroidism due to acquired atrophy of thyroid   5. Pure hypercholesterolemia   6. Cervical cancer screening   7. Breast cancer screening   8. Class 1 obesity due to excess calories with serious comorbidity and body mass index (BMI) of 31.0 to 31.9 in adult   9. PVC's -- frequent with bigeminy     -anticipatory guidance provided --- exercise, weight loss, safe driving practices, aspirin 81mg  daily, 3 servings of dairy daily. -obtain age appropriate screening labs and labs for chronic disease management. -Hypertension hypercholesterolemia: Well controlled.  Obtain labs for chronic disease management.  Refills provided. -Hypothyroidism: Managed by ear nose and throat.  Obtain labs.   -PVCs With bigeminy: Followed closely by cardiology. -obesity: Recommend weight loss, exercise for 30-60 minutes five days per week; recommend 1200 kcal restriction per day with a minimum of 60 grams of protein per day.  Eat 3 meals per day. Do not skip meals. Consider having a protein shake as a meal replacement to aid with eliminating meal skipping. Look for products with <220 calories, <7 gm sugar, and 20-30 gm protein.  Eat breakfast within 2 hours of getting up.   Make  your plate non-starchy vegetables,  protein, and  carbohydrates at lunch and dinner.   Aim for at least 64 oz. of calorie-free beverages daily (water, Crystal Light, diet green tea, etc.). Eliminate any sugary beverages such as regular soda, sweet tea, or fruit juice.   Pay attention to hunger and fullness cues.  Stop eating once you feel satisfied; don't wait until you feel full,  stuffed, or sick from eating.  Choose lean meats and low fat/fat free dairy products.  Choose foods high in fiber such as fruits, vegetables, and whole grains (brown rice, whole wheat pasta, whole wheat bread, etc.).  Limit foods with added sugar to <7 gm  per serving.  Always eat in the kitchen/dining room.  Never eat in the bedroom or in front of the TV.    Orders Placed This Encounter  Procedures  . CBC with Differential/Platelet  . Comprehensive metabolic panel    Order Specific Question:   Has the patient fasted?    Answer:   No  . Hemoglobin A1c  . Lipid panel    Order Specific Question:   Has the patient fasted?    Answer:   No  . POCT urinalysis dipstick   Meds ordered this encounter  Medications  . hydrochlorothiazide (HYDRODIURIL) 12.5 MG tablet    Sig: Take 1 tablet (12.5 mg total) by mouth daily.    Dispense:  90 tablet    Refill:  1  . atorvastatin (LIPITOR) 20 MG tablet    Sig: Take 1 tablet (20 mg total) by mouth daily.    Dispense:  90 tablet    Refill:  1    Return in about 6 months (around 07/08/2018) for follow-up chronic medical conditions.   Latese Dufault Elayne Guerin, M.D. Primary Care at [facility] [address] [place], [place]  [address] [phone] phone [phone] fax

## 2018-01-05 NOTE — Patient Instructions (Addendum)
We recommend that you schedule a mammogram for breast cancer screening. Typically, you do not need a referral to do this. Please contact a local imaging center to schedule your mammogram.  [facility] - [phone]  *ask for the Radiology Department [facility] ([place]) - [phone] or [phone]  [facility] - [phone] [place] [phone] [facility] - [phone]  *ask for the [facility] - [phone]  *ask for the Radiology Department [facility] - [phone]  *ask for the Mammography Department [facility] - [phone]   [facility] as of August 2019.   IF you received an x-ray today, you will receive an invoice from [facility] Radiology. Please contact [facility] Radiology at [phone] with questions or concerns regarding your invoice.   IF you received labwork today, you will receive an invoice from [facility]. Please contact [facility] at [phone] with questions or concerns regarding your invoice.   Our billing staff will not be able to assist you with questions regarding bills from these companies.  You will be contacted with the lab results as soon as they are available. The fastest way to get your results is to activate your My Chart account. Instructions are located on the last page of this paperwork. If you have not heard from [place] regarding the results in 2 weeks, please contact this office.      Preventive Care 40-64 Years, Female Preventive care refers to lifestyle choices and visits with your health care provider that can promote health and wellness. What does preventive care include?  A yearly physical exam. This is also called an annual well check.  Dental exams once or twice a year.  Routine eye exams. Ask your health care provider how often you should have your eyes checked.  Personal  lifestyle choices, including: ? Daily care of your teeth and gums. ? Regular physical activity. ? Eating a healthy diet. ? Avoiding tobacco and drug use. ? Limiting alcohol use. ? Practicing safe sex. ? Taking low-dose aspirin daily starting at age 30. ? Taking vitamin and mineral supplements as recommended by your health care provider. What happens during an annual well check? The services and screenings done by your health care provider during your annual well check will depend on your age, overall health, lifestyle risk factors, and family history of disease. Counseling Your health care provider may ask you questions about your:  Alcohol use.  Tobacco use.  Drug use.  Emotional well-being.  Home and relationship well-being.  Sexual activity.  Eating habits.  Work and work Statistician.  Method of birth control.  Menstrual cycle.  Pregnancy history.  Screening You may have the following tests or measurements:  Height, weight, and BMI.  Blood pressure.  Lipid and cholesterol levels. These may be checked every 5 years, or more frequently if you are over 34 years old.  Skin check.  Lung cancer screening. You may have this screening every year starting at age 32 if you have a 30-pack-year history of smoking and currently smoke or have quit within the past 15 years.  Fecal occult blood test (FOBT) of the stool. You may have this test every year starting at age 75.  Flexible sigmoidoscopy or colonoscopy. You may have a sigmoidoscopy every 5 years or a colonoscopy every 10 years starting at age 51.  Hepatitis C blood test.  Hepatitis B blood test.  Sexually  transmitted disease (STD) testing.  Diabetes screening. This is done by checking your blood sugar (glucose) after you have not eaten for a while (fasting). You may have this done every 1-3 years.  Mammogram. This may be done every 1-2 years. Talk to your health care provider about when you should start having  regular mammograms. This may depend on whether you have a family history of breast cancer.  BRCA-related cancer screening. This may be done if you have a family history of breast, ovarian, tubal, or peritoneal cancers.  Pelvic exam and Pap test. This may be done every 3 years starting at age 39. Starting at age 90, this may be done every 5 years if you have a Pap test in combination with an HPV test.  Bone density scan. This is done to screen for osteoporosis. You may have this scan if you are at high risk for osteoporosis.  Discuss your test results, treatment options, and if necessary, the need for more tests with your health care provider. Vaccines Your health care provider may recommend certain vaccines, such as:  Influenza vaccine. This is recommended every year.  Tetanus, diphtheria, and acellular pertussis (Tdap, Td) vaccine. You may need a Td booster every 10 years.  Varicella vaccine. You may need this if you have not been vaccinated.  Zoster vaccine. You may need this after age 24.  Measles, mumps, and rubella (MMR) vaccine. You may need at least one dose of MMR if you were born in 1957 or later. You may also need a second dose.  Pneumococcal 13-valent conjugate (PCV13) vaccine. You may need this if you have certain conditions and were not previously vaccinated.  Pneumococcal polysaccharide (PPSV23) vaccine. You may need one or two doses if you smoke cigarettes or if you have certain conditions.  Meningococcal vaccine. You may need this if you have certain conditions.  Hepatitis A vaccine. You may need this if you have certain conditions or if you travel or work in places where you may be exposed to hepatitis A.  Hepatitis B vaccine. You may need this if you have certain conditions or if you travel or work in places where you may be exposed to hepatitis B.  Haemophilus influenzae type b (Hib) vaccine. You may need this if you have certain conditions.  Talk to your health  care provider about which screenings and vaccines you need and how often you need them. This information is not intended to replace advice given to you by your health care provider. Make sure you discuss any questions you have with your health care provider. Document Released: 09/01/2015 Document Revised: 04/24/2016 Document Reviewed: 06/06/2015 Elsevier Interactive Patient Education  [employer].

## 2018-01-05 NOTE — Progress Notes (Deleted)
Subjective:    Patient ID: Stacey Cline, female    DOB: Nov 25, 1955, 62 y.o.   MRN: [id number]  01/05/2018  Annual Exam    HPI This 62 y.o. female presents for evaluation of ***. BP Readings from Last 3 Encounters:  12/18/17 134/70  07/14/17 126/78  04/14/17 121/80   Wt Readings from Last 3 Encounters:  12/18/17 193 lb (87.5 kg)  07/14/17 193 lb 12.8 oz (87.9 kg)  04/14/17 187 lb (84.8 kg)   Immunization History  Administered Date(s) Administered  . Hepatitis B 09/19/2004  . Td 08/19/2006  . Tdap 12/31/2016    Review of Systems  Genitourinary: Positive for dyspareunia.    Past Medical History:  Diagnosis Date  . Dyspnea   . Hypertension   . Leiomyoma of uterus, unspecified   . Menopause age 43  . Papanicolaou smear of cervix with atypical squamous cells of undetermined significance (ASC-US)   . Postsurgical hypothyroidism   . Pure hypercholesterolemia   . PVCs (premature ventricular contractions)   . Unspecified vitamin D deficiency   . Ventral hernia, unspecified, without mention of obstruction or gangrene   . Wears dentures    partial upper   Past Surgical History:  Procedure Laterality Date  . BREAST BIOPSY  2006   Right  Benign  . CARDIAC CATHETERIZATION N/A 03/06/2016   Procedure: Right/Left Heart Cath and Coronary Angiography;  Surgeon: Wellington Hampshire, MD;  Location: [facility];  Service: Cardiovascular;  Laterality: N/A;  . COLONOSCOPY WITH PROPOFOL N/A 04/14/2017   Procedure: COLONOSCOPY WITH PROPOFOL;  Surgeon: Lucilla Lame, MD;  Location: [facility];  Service: Endoscopy;  Laterality: N/A;  . COSMETIC SURGERY     feet  . TONSILLECTOMY AND ADENOIDECTOMY  1960's  . TOTAL THYROIDECTOMY  2008   Allergies  Allergen Reactions  . Fish Allergy Hives and Itching  . Garlic Swelling    angioedema  . Isovue [Iopamidol] Hives    Pt developed 1 hive on her chest and 2 on her neck area appx 10 minutes post contrast injection. Given  50 mg oral benadryl.  Needs 13 hour pre meds in the future.   Marland Kitchen Penicillins Rash    Has patient had a PCN reaction causing immediate rash, facial/tongue/throat swelling, SOB or lightheadedness with hypotension: Yes Has patient had a PCN reaction causing severe rash involving mucus membranes or skin necrosis: No Has patient had a PCN reaction that required hospitalization No Has patient had a PCN reaction occurring within the last 10 years: Yes If all of the above answers are "NO", then may proceed with Cephalosporin use.   . Shellfish Allergy Hives and Rash   Current Outpatient Medications on File Prior to Visit  Medication Sig Dispense Refill  . atorvastatin (LIPITOR) 20 MG tablet Take 1 tablet (20 mg total) by mouth daily. 90 tablet 1  . flecainide (TAMBOCOR) 100 MG tablet TAKE ONE TABLET (100 MG) BY MOUTH TWICE DAILY 60 tablet 6  . hydrochlorothiazide (HYDRODIURIL) 12.5 MG tablet Take 1 tablet (12.5 mg total) by mouth daily. 90 tablet 1  . levothyroxine (SYNTHROID, LEVOTHROID) 75 MCG tablet Take 1 tablet (75 mcg) by mouth once daily on Saturday and Sunday    . levothyroxine (SYNTHROID, LEVOTHROID) 88 MCG tablet Take 1 tablet (88 mcg) by mouth once daily Monday through Friday     No current facility-administered medications on file prior to visit.    Social History   Socioeconomic History  . Marital status: Married  Spouse name: Not on file  . Number of children: 0  . Years of education: college  . Highest education level: Not on file  Occupational History  . Occupation: Ambulance person: [employer]  . Financial resource strain: Not on file  . Food insecurity:    Worry: Not on file    Inability: Not on file  . Transportation needs:    Medical: Not on file    Non-medical: Not on file  Tobacco Use  . Smoking status: Never Smoker  . Smokeless tobacco: Never Used  Substance and Sexual Activity  . Alcohol use: No  . Drug use: No  . Sexual activity: Yes   Lifestyle  . Physical activity:    Days per week: Not on file    Minutes per session: Not on file  . Stress: Not on file  Relationships  . Social connections:    Talks on phone: Not on file    Gets together: Not on file    Attends religious service: Not on file    Active member of club or organization: Not on file    Attends meetings of clubs or organizations: Not on file    Relationship status: Not on file  . Intimate partner violence:    Fear of current or ex partner: Not on file    Emotionally abused: Not on file    Physically abused: Not on file    Forced sexual activity: Not on file  Other Topics Concern  . Not on file  Social History Narrative   Married x 15 years; Happily, no abuse. Husband is a Theme park manager.       Children: none      Lives: with husband.      Employment:  Retire 06/2012 from [employer] after 35 years; wants to volunteer.      Tobacco:        Alcohol:   Caffeine use: Moderate amount.       Exercise: gym three days per week.     Family History  Problem Relation Age of Onset  . Heart disease Father        CAD/3  AMI in 3's   . Stroke Father        several  . Lung disease Father   . Hypertension Mother   . Diabetes Mother   . Heart disease Mother 30       AMI several; first unsure age.  Pacemaker  . Hyperlipidemia Mother   . Kidney failure Mother   . Diabetes Sister   . Cancer Sister 73       Breast cancer  . Hypertension Brother   . Stroke Sister   . Hypertension Sister   . Breast cancer Unknown   . Hypertension Unknown   . Diabetes type II Unknown        Objective:    There were no vitals taken for this visit. Physical Exam No results found. Depression screen [facility] 2/9 07/14/2017 12/31/2016 06/11/2016 02/07/2016 11/28/2015  Decreased Interest 0 0 0 0 0  Down, Depressed, Hopeless 0 0 0 0 0  PHQ - 2 Score 0 0 0 0 0   Fall Risk  07/14/2017 12/31/2016 06/11/2016 02/07/2016 02/07/2016  Falls in the past year? No No No No No        Assessment &  Plan:   1. Routine physical examination   2. Essential hypertension   3. Glucose intolerance (impaired glucose tolerance)   4.  Hypothyroidism due to acquired atrophy of thyroid   5. Pure hypercholesterolemia     ***  Orders Placed This Encounter  Procedures  . CBC with Differential/Platelet  . Comprehensive metabolic panel    Order Specific Question:   Has the patient fasted?    Answer:   No  . Hemoglobin A1c  . Lipid panel    Order Specific Question:   Has the patient fasted?    Answer:   No  . POCT urinalysis dipstick   No orders of the defined types were placed in this encounter.   No follow-ups on file.   Ahilyn Nell Elayne Guerin, M.D. Primary Care at [facility] previously [facility] [address] [place], [place]  [address] [phone] phone [phone] fax

## 2018-01-06 LAB — CBC WITH DIFFERENTIAL/PLATELET
BASOS: 1 %
Basophils Absolute: 0 10*3/uL (ref 0.0–0.2)
EOS (ABSOLUTE): 0 10*3/uL (ref 0.0–0.4)
Eos: 1 %
HEMOGLOBIN: 13.3 g/dL (ref 11.1–15.9)
Hematocrit: 39.2 % (ref 34.0–46.6)
IMMATURE GRANS (ABS): 0 10*3/uL (ref 0.0–0.1)
Immature Granulocytes: 0 %
LYMPHS ABS: 2.4 10*3/uL (ref 0.7–3.1)
LYMPHS: 40 %
MCH: 28.6 pg (ref 26.6–33.0)
MCHC: 33.9 g/dL (ref 31.5–35.7)
MCV: 84 fL (ref 79–97)
Monocytes Absolute: 0.4 10*3/uL (ref 0.1–0.9)
Monocytes: 7 %
NEUTROS ABS: 3.1 10*3/uL (ref 1.4–7.0)
Neutrophils: 51 %
PLATELETS: 245 10*3/uL (ref 150–450)
RBC: 4.65 x10E6/uL (ref 3.77–5.28)
RDW: 15.8 % — ABNORMAL HIGH (ref 12.3–15.4)
WBC: 6.1 10*3/uL (ref 3.4–10.8)

## 2018-01-06 LAB — COMPREHENSIVE METABOLIC PANEL
A/G RATIO: 1.8 (ref 1.2–2.2)
ALBUMIN: 4.2 g/dL (ref 3.6–4.8)
ALT: 15 IU/L (ref 0–32)
AST: 17 IU/L (ref 0–40)
Alkaline Phosphatase: 63 IU/L (ref 39–117)
BILIRUBIN TOTAL: 0.6 mg/dL (ref 0.0–1.2)
BUN/Creatinine Ratio: 16 (ref 12–28)
BUN: 17 mg/dL (ref 8–27)
CO2: 26 mmol/L (ref 20–29)
Calcium: 9.2 mg/dL (ref 8.7–10.3)
Chloride: 103 mmol/L (ref 96–106)
Creatinine, Ser: 1.06 mg/dL — ABNORMAL HIGH (ref 0.57–1.00)
GFR, EST AFRICAN AMERICAN: 65 mL/min/{1.73_m2} (ref >59)
GFR, EST NON AFRICAN AMERICAN: 56 mL/min/{1.73_m2} — AB (ref >59)
Globulin, Total: 2.3 g/dL (ref 1.5–4.5)
Glucose: 98 mg/dL (ref 65–99)
POTASSIUM: 3.6 mmol/L (ref 3.5–5.2)
Sodium: 145 mmol/L — ABNORMAL HIGH (ref 134–144)
Total Protein: 6.5 g/dL (ref 6.0–8.5)

## 2018-01-06 LAB — LIPID PANEL
CHOLESTEROL TOTAL: 160 mg/dL (ref 100–199)
Chol/HDL Ratio: 3.2 ratio (ref 0.0–4.4)
HDL: 50 mg/dL (ref >39)
LDL CALC: 92 mg/dL (ref 0–99)
Triglycerides: 92 mg/dL (ref 0–149)
VLDL CHOLESTEROL CAL: 18 mg/dL (ref 5–40)

## 2018-01-06 LAB — HEMOGLOBIN A1C
Est. average glucose Bld gHb Est-mCnc: 123 mg/dL
Hgb A1c MFr Bld: 5.9 % — ABNORMAL HIGH (ref 4.8–5.6)

## 2018-01-07 LAB — PAP IG AND HPV HIGH-RISK
HPV, high-risk: NEGATIVE
PAP SMEAR COMMENT: 0

## 2018-01-08 ENCOUNTER — Ambulatory Visit (INDEPENDENT_AMBULATORY_CARE_PROVIDER_SITE_OTHER): Payer: BLUE CROSS/BLUE SHIELD | Admitting: Internal Medicine

## 2018-01-08 ENCOUNTER — Encounter: Payer: Self-pay | Admitting: Internal Medicine

## 2018-01-08 VITALS — BP 130/72 | HR 68 | Ht 66.0 in | Wt 192.4 lb

## 2018-01-08 DIAGNOSIS — R0609 Other forms of dyspnea: Secondary | ICD-10-CM | POA: Diagnosis not present

## 2018-01-08 DIAGNOSIS — R0602 Shortness of breath: Secondary | ICD-10-CM

## 2018-01-08 DIAGNOSIS — R0600 Dyspnea, unspecified: Secondary | ICD-10-CM

## 2018-01-08 NOTE — Patient Instructions (Addendum)
ICD-10-CM   1. Dyspnea on exertion R06.09       Do HRCT supine and prone If this is normal next step is CPST bike test with EIB challleng  Followup - based on results o f HRCR

## 2018-01-08 NOTE — Progress Notes (Signed)
Subjective:     Patient ID: Stacey Cline, female   DOB: 05/03/1956, 62 y.o.   MRN: [id number]        HPI  IOV 02/08/2016  Chief Complaint  Patient presents with  . Pulmonary Consult    referred by Dr Reginia Forts for DOE x2 months, progressively worsening x2 days.  reports wheezing w exertion, dry cough.  denies tightness, congestion, mucus production, CP, f/c/s    62 year old obese female. Nonsmoker. Has insidious onset of shortness of breath for the last 6 months. It is progressive. Current severity severe. A few months ago she got dyspneic climbing stairs but now she gets dyspneic even talking or going from a car to the front porch off our office. She feels it is terrible. She frequently travels a lot on car journeys of over 5 hours. 6 months to a year ago she did go by car to [place] before heading out to the [place] for [place]. There is some occasional mild intermittent wheeze but overall this is not a prominent feature. This no cough. This no associated chest pain. She recollects a cardiac stress test 3 years ago by Dr. Midge Minium that was normal. Review of the chart shows that was done on 06/07/2013 and she exercised for 7 mets. It was normal. Is a low risk scan with ejection fraction of 72%. This no bleeding associated with this. This no orthopnea or paroxysmal nocturnal dyspnea   RN felt pulse to be low and HR 40 on repeat eval (intake was 60) - 12 lead with [place] - visualized - has Ventricular Bigeminy  Hemoglobin 14.1 g percent in 04/11/2  Blood work creatinine 1.09 mg percent 02/07/2016  D-dimer 0.26 and normal on 02/07/2016  FEno 02/08/2016 - 12 ppb and normal     The following chest x-ray personally visualized Dg Chest 2 View  02/07/2016  CLINICAL DATA:  Shortness of breath today. EXAM: CHEST  2 VIEW COMPARISON:  PA and lateral chest 04/28/2013. FINDINGS: The lungs are clear. Heart size is normal. No pneumothorax or pleural effusion.  Mild appearing degenerative disease at thoracolumbar junction noted. IMPRESSION: No acute disease. Electronically Signed   By: Inge Rise M.D.   On: 02/07/2016 11:16    Walking desaturation test on 02/08/2016 185 feet x 3 laps:  did NOT desaturate. Rest pulse ox was 99%, final pulse ox was 99%. HR response 45/min at rest to 56/min at peak exertion. (EKG yesterday was personally visualized with a heart rate of 72/m and in April 2017 was 89/min)    She is traveling to [facility] early next week and is asking for expeditiuus eval even if it means exposure to radiologic dye and radiation via CT   OV 01/08/2018  Chief Complaint  Patient presents with  . Follow-up    Pt last seen 02/08/16.  Appt was made per Dr. Caryl Comes Cardiology due to pt unable to walk up steps. Pt states she can walk fine with long distances but is unable to walk up steps without having problems with SOB. Pt denies any c/o cough or CP/chest tightness.    Follow-up dyspnea  I last saw her almost 2 years ago for shortness of breath associated with palpitations.  At that time she had palpitations along with worsening dyspnea that was initially for stairs but even like moderate distances such as walking from the porch to the car [at this point in time she does not remember exertional dyspnea back then with flat  ground].  Found to have PVCs.  Referred her to Dr. Caryl Comes in electrophysiology.  She notes that she has been on flecainide.  Overall she says that her palpitations have been resolved with flecainide therapy.  However she still has had dyspnea on exertion particularly for stairs.  She says that at home she does not have much of a problem with a flight of stairs but if she is on a cruise ship where the flight of stairs is much longer than she will have dyspnea when she gets to one flight of stairs and has to stop to rest.  There is no associated chest pain.  On flat ground she says she can go a long distance not otherwise  specified without any difficulty and can outplace appears.  Review of the chart shows that Dr. Caryl Comes does not think that she has diastolic dysfunction.  She has had cardiac catheterization.  There is no wheezing orthopnea proximal nocturnal dyspnea or fever or weight loss or cough or wheeze.  She is asking if she is probably deconditioned   Walk test on stairs - tachycardic even at rest. Made worse with stairs. Did not desaturate  Simple office walk in stairwell at [facility] 0- 2nd floor to 5th floor 01/08/2018   O2 used Room air forehead probe  Number laps completed 3 flights   Comments about pace Struggled after 1.5 flight of stairs with dyspnea and leg fatigue  Resting Pulse Ox/HR 100% and 95/min  Final Pulse Ox/HR 100% and 116/min @ 5th flor - 3 flights  Desaturated </= 88% no  Desaturated <= 3% points no  Got Tachycardic >/= 90/min Yes even at rest  Symptoms at end of test dyspnea  Miscellaneous comments Stopped at 4th floor to get catch her breath      has a past medical history of Dyspnea, Hypertension, Leiomyoma of uterus, unspecified, Menopause (age 49), Papanicolaou smear of cervix with atypical squamous cells of undetermined significance (ASC-US), Postsurgical hypothyroidism, Pure hypercholesterolemia, PVCs (premature ventricular contractions), Unspecified vitamin D deficiency, Ventral hernia, unspecified, without mention of obstruction or gangrene, and Wears dentures.   reports that she has never smoked. She has never used smokeless tobacco.  Past Surgical History:  Procedure Laterality Date  . BREAST BIOPSY  2006   Right  Benign  . CARDIAC CATHETERIZATION N/A 03/06/2016   Procedure: Right/Left Heart Cath and Coronary Angiography;  Surgeon: Wellington Hampshire, MD;  Location: [facility];  Service: Cardiovascular;  Laterality: N/A;  . COLONOSCOPY WITH PROPOFOL N/A 04/14/2017   Procedure: COLONOSCOPY WITH PROPOFOL;  Surgeon: Lucilla Lame, MD;  Location: [facility];   Service: Endoscopy;  Laterality: N/A;  . COSMETIC SURGERY     feet  . TONSILLECTOMY AND ADENOIDECTOMY  1960's  . TOTAL THYROIDECTOMY  2008    Allergies  Allergen Reactions  . Fish Allergy Hives and Itching  . Garlic Swelling    angioedema  . Isovue [Iopamidol] Hives    Pt developed 1 hive on her chest and 2 on her neck area appx 10 minutes post contrast injection. Given 50 mg oral benadryl.  Needs 13 hour pre meds in the future.   Marland Kitchen Penicillins Rash    Has patient had a PCN reaction causing immediate rash, facial/tongue/throat swelling, SOB or lightheadedness with hypotension: Yes Has patient had a PCN reaction causing severe rash involving mucus membranes or skin necrosis: No Has patient had a PCN reaction that required hospitalization No Has patient had a PCN reaction occurring within  the last 10 years: Yes If all of the above answers are "NO", then may proceed with Cephalosporin use.   . Shellfish Allergy Hives and Rash    Immunization History  Administered Date(s) Administered  . Hepatitis B 09/19/2004  . Td 08/19/2006  . Tdap 12/31/2016    Family History  Problem Relation Age of Onset  . Heart disease Father        CAD/3  AMI in 78's   . Stroke Father        several  . Lung disease Father   . Hypertension Mother   . Diabetes Mother   . Heart disease Mother 4       AMI several; first unsure age.  Pacemaker  . Hyperlipidemia Mother   . Kidney failure Mother   . Diabetes Sister   . Cancer Sister 15       Breast cancer  . Hypertension Brother   . Hypercholesterolemia Brother   . Stroke Sister   . Hypertension Sister   . Breast cancer Unknown   . Hypertension Unknown   . Diabetes type II Unknown      Current Outpatient Medications:  .  atorvastatin (LIPITOR) 20 MG tablet, Take 1 tablet (20 mg total) by mouth daily., Disp: 90 tablet, Rfl: 1 .  flecainide (TAMBOCOR) 100 MG tablet, TAKE ONE TABLET (100 MG) BY MOUTH TWICE DAILY, Disp: 60 tablet, Rfl: 6 .   hydrochlorothiazide (HYDRODIURIL) 12.5 MG tablet, Take 1 tablet (12.5 mg total) by mouth daily., Disp: 90 tablet, Rfl: 1 .  levothyroxine (SYNTHROID, LEVOTHROID) 75 MCG tablet, Take 1 tablet (75 mcg) by mouth once daily on Saturday and Sunday, Disp: , Rfl:  .  levothyroxine (SYNTHROID, LEVOTHROID) 88 MCG tablet, Take 1 tablet (88 mcg) by mouth once daily Monday through Friday, Disp: , Rfl:     Review of Systems     Objective:   Physical Exam  Constitutional: She is oriented to person, place, and time. She appears well-developed and well-nourished. No distress.  HENT:  Head: Normocephalic and atraumatic.  Right Ear: External ear normal.  Left Ear: External ear normal.  Mouth/Throat: Oropharynx is clear and moist. No oropharyngeal exudate.  Eyes: Pupils are equal, round, and reactive to light. Conjunctivae and EOM are normal. Right eye exhibits no discharge. Left eye exhibits no discharge. No scleral icterus.  Neck: Normal range of motion. Neck supple. No JVD present. No tracheal deviation present. No thyromegaly present.  Cardiovascular: Normal rate, regular rhythm, normal heart sounds and intact distal pulses. Exam reveals no gallop and no friction rub.  No murmur heard. Pulmonary/Chest: Effort normal and breath sounds normal. No respiratory distress. She has no wheezes. She has no rales. She exhibits no tenderness.  Abdominal: Soft. Bowel sounds are normal. She exhibits no distension and no mass. There is no tenderness. There is no rebound and no guarding.  Musculoskeletal: Normal range of motion. She exhibits no edema or tenderness.  Lymphadenopathy:    She has no cervical adenopathy.  Neurological: She is alert and oriented to person, place, and time. She has normal reflexes. No cranial nerve deficit. She exhibits normal muscle tone. Coordination normal.  Skin: Skin is warm and dry. No rash noted. She is not diaphoretic. No erythema. No pallor.  Psychiatric: She has a normal mood and  affect. Her behavior is normal. Judgment and thought content normal.  Vitals reviewed.    Vitals:   01/08/18 1555  BP: 130/72  Pulse: 68  SpO2: 98%  Weight:  192 lb 6.4 oz (87.3 kg)  Height: 5\' 6"  (1.676 m)    Estimated body mass index is 31.05 kg/m as calculated from the following:   Height as of this encounter: 5\' 6"  (1.676 m).   Weight as of this encounter: 192 lb 6.4 oz (87.3 kg).     Assessment:       ICD-10-CM   1. Dyspnea on exertion R06.09    Suspect whatever is drving tachycardia is making her dyspneic.     Plan:      Do HRCT supine and prone - rule out ILD If this is normal next step is CPST bike test with EIB challleng  Followup - based on results o f HRCR  > 50% of this > 25 min visit spent in face to face counseling or coordination of care    Dr. Brand Males, M.D., [facility].C.P Pulmonary and Critical Care Medicine Staff Physician, [place] Director - Interstitial Lung Disease  Program  Pulmonary [place] [facility] [place], [place], [address]  Pager: [phone], If no answer or between  15:00h - 7:00h: call [phone] Telephone: [phone]

## 2018-01-13 ENCOUNTER — Encounter: Payer: Self-pay | Admitting: Family Medicine

## 2018-01-22 ENCOUNTER — Ambulatory Visit
Admission: RE | Admit: 2018-01-22 | Discharge: 2018-01-22 | Disposition: A | Discharge status: Home / Self Care | Payer: BLUE CROSS/BLUE SHIELD | Source: Ambulatory Visit | Attending: Internal Medicine | Admitting: Internal Medicine

## 2018-01-22 DIAGNOSIS — R918 Other nonspecific abnormal finding of lung field: Secondary | ICD-10-CM | POA: Diagnosis not present

## 2018-01-22 DIAGNOSIS — R0609 Other forms of dyspnea: Secondary | ICD-10-CM | POA: Diagnosis present

## 2018-01-22 DIAGNOSIS — R0600 Dyspnea, unspecified: Secondary | ICD-10-CM

## 2018-01-22 DIAGNOSIS — I700 Atherosclerosis of aorta: Secondary | ICD-10-CM | POA: Diagnosis not present

## 2018-01-29 ENCOUNTER — Telehealth: Payer: Self-pay | Admitting: Internal Medicine

## 2018-01-29 DIAGNOSIS — R0602 Shortness of breath: Secondary | ICD-10-CM

## 2018-01-29 NOTE — Telephone Encounter (Signed)
LEt her know Stacey Cline   A) sorry for delay B) CT lung fields is clean C) there is small lung nodules 49mm - needs followup CT in 1 year.  D) do CPST bike test with EIB challenge and return for followup   Dr. Brand Males, M.D., [facility].C.P Pulmonary and Critical Care Medicine Staff Physician, [place] Director - Interstitial Lung Disease  Program  Pulmonary [facility] at [place], [place], [address]  Pager: [phone], If no answer or between  15:00h - 7:00h: call [phone] Telephone: [phone]      IMPRESSION: 1. No findings to suggest interstitial lung disease. 2. 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 3. Mild aortic atherosclerosis. 4. Multiple small pulmonary nodules measuring 4 mm or less in size. These are nonspecific but are statistically likely benign. No follow-up needed if patient is low-risk (and has no known or suspected primary neoplasm). Non-contrast chest CT can be considered in 12 months if patient is high-risk. This recommendation follows the consensus statement: Guidelines for Management of Incidental Pulmonary Nodules Detected on CT Images: From the Fleischner Society 2017; Radiology 2017; 284:228-243.  Aortic Atherosclerosis (ICD10-I70.0).   Electronically Signed   By: Vinnie Langton M.D.   On: 01/22/2018 16:57

## 2018-01-29 NOTE — Telephone Encounter (Signed)
Called and spoke with Patient about MR recommendations and results from CT chest.  Patient stated understanding.  Patient stated that she was paying out of pocket and was not sure if she wanted CPST bike test.  She agreed to having it ordered, but will make decision after called about scheduling and cost.  CPST bike test with EIB challenge order placed.  Offered to schedule follow up appointment, Patient declined.  Patient stated that if she has bike test she will call for follow up to go over results and questions.  Nothing further at this time.

## 2018-01-29 NOTE — Telephone Encounter (Signed)
Spoke with pt, advised her I would send a message to MR to review results. She would like her results from the CT chest. She would also like to know when she needs to come in to see MR again. Please advise.

## 2018-03-10 ENCOUNTER — Encounter (HOSPITAL_COMMUNITY): Payer: BLUE CROSS/BLUE SHIELD

## 2018-03-18 ENCOUNTER — Ambulatory Visit (HOSPITAL_COMMUNITY): Payer: BLUE CROSS/BLUE SHIELD | Attending: Internal Medicine

## 2018-03-18 DIAGNOSIS — K439 Ventral hernia without obstruction or gangrene: Secondary | ICD-10-CM | POA: Insufficient documentation

## 2018-03-18 DIAGNOSIS — D259 Leiomyoma of uterus, unspecified: Secondary | ICD-10-CM | POA: Insufficient documentation

## 2018-03-18 DIAGNOSIS — E7800 Pure hypercholesterolemia, unspecified: Secondary | ICD-10-CM | POA: Insufficient documentation

## 2018-03-18 DIAGNOSIS — E890 Postprocedural hypothyroidism: Secondary | ICD-10-CM | POA: Diagnosis not present

## 2018-03-18 DIAGNOSIS — Z780 Asymptomatic menopausal state: Secondary | ICD-10-CM | POA: Insufficient documentation

## 2018-03-18 DIAGNOSIS — I493 Ventricular premature depolarization: Secondary | ICD-10-CM | POA: Diagnosis not present

## 2018-03-18 DIAGNOSIS — E559 Vitamin D deficiency, unspecified: Secondary | ICD-10-CM | POA: Insufficient documentation

## 2018-03-18 DIAGNOSIS — I10 Essential (primary) hypertension: Secondary | ICD-10-CM | POA: Diagnosis not present

## 2018-03-18 DIAGNOSIS — R0600 Dyspnea, unspecified: Secondary | ICD-10-CM | POA: Diagnosis not present

## 2018-03-18 DIAGNOSIS — R0602 Shortness of breath: Secondary | ICD-10-CM | POA: Diagnosis not present

## 2018-04-08 ENCOUNTER — Telehealth: Payer: Self-pay | Admitting: Internal Medicine

## 2018-04-08 NOTE — Telephone Encounter (Signed)
Spoke with pt, she would like her results from the stress test MR ordered. Please advise.

## 2018-04-09 NOTE — Telephone Encounter (Signed)
LMTCB

## 2018-04-09 NOTE — Telephone Encounter (Signed)
CPST shows   - hypertensive and weight is reason for dyspnea  Plan  - please fix an appt to discuss - first avail is fine   THanks  Dr. Brand Males, M.D., [facility].C.P Pulmonary and Critical Care Medicine Staff Physician, [place] Director - Interstitial Lung Disease  Program  Pulmonary [name] at [place], [place], [address]  Pager: [phone], If no answer or between  15:00h - 7:00h: call [phone] Telephone: [phone]   ...............................  Conclusion: Exercise testing with gas exchange demonstrates normal functional capacity when compared to matched sedentary norms. There is no cardiopulmonary limitation or evidence of exercise-induced bronchospasm. However, there was a significant hypertensive response to exercise, ultimately causing termination. Patient's body habitus is also contributing, likely to both hypertension and exercise intolerance. Please correlate clinically.    Test, report and preliminary impression by: Stacey Martins, MS, ACSM-RCEP 03/18/2018 4:00 PM  FINALIZED Stacey Garfinkel MD [facility] 03/24/2018, 1:38 PM

## 2018-04-10 NOTE — Telephone Encounter (Signed)
Called spoke with patient and discussed CPST results/recs as stated by MR Pt voiced her understanding Appt scheduled with MR for 9.23.19 @ 1015 to discuss in detail  Nothing further needed; will sign off

## 2018-04-10 NOTE — Telephone Encounter (Signed)
Attempted to call pt. Call went straight to voicemail. I have left a message for pt to return our call.

## 2018-04-10 NOTE — Telephone Encounter (Signed)
Patient returned phone call/ pt contact # [phone]

## 2018-05-11 ENCOUNTER — Ambulatory Visit: Payer: BLUE CROSS/BLUE SHIELD | Admitting: Nurse Practitioner

## 2018-05-11 ENCOUNTER — Ambulatory Visit: Payer: BLUE CROSS/BLUE SHIELD | Admitting: Internal Medicine

## 2018-07-28 ENCOUNTER — Other Ambulatory Visit: Payer: Self-pay

## 2018-07-28 MED ORDER — FLECAINIDE ACETATE 100 MG PO TABS: 100.0000 mg | ORAL_TABLET | Freq: Two times a day (BID) | ORAL | 3 refills | Status: DC

## 2018-07-28 NOTE — Telephone Encounter (Signed)
*  STAT* If patient is at the pharmacy, call can be transferred to refill team.   1. Which medications need to be refilled? (please list name of each medication and dose if known) Flecainide  2. Which pharmacy/location (including street and city if local pharmacy) is medication to be sent to? [facility]  3. Do they need a 30 day or 90 day supply? [place]

## 2018-08-28 ENCOUNTER — Emergency Department: Payer: BLUE CROSS/BLUE SHIELD

## 2018-08-28 ENCOUNTER — Other Ambulatory Visit: Payer: Self-pay

## 2018-08-28 ENCOUNTER — Emergency Department
Admission: EM | Admit: 2018-08-28 | Discharge: 2018-08-28 | Disposition: A | Discharge status: Home / Self Care | Payer: BLUE CROSS/BLUE SHIELD | Attending: Student in an Organized Health Care Education/Training Program | Admitting: Student in an Organized Health Care Education/Training Program

## 2018-08-28 ENCOUNTER — Encounter: Payer: Self-pay | Admitting: Radiology

## 2018-08-28 DIAGNOSIS — R079 Chest pain, unspecified: Secondary | ICD-10-CM

## 2018-08-28 DIAGNOSIS — I10 Essential (primary) hypertension: Secondary | ICD-10-CM | POA: Diagnosis not present

## 2018-08-28 DIAGNOSIS — Z79899 Other long term (current) drug therapy: Secondary | ICD-10-CM | POA: Insufficient documentation

## 2018-08-28 DIAGNOSIS — M25512 Pain in left shoulder: Secondary | ICD-10-CM | POA: Diagnosis not present

## 2018-08-28 LAB — BASIC METABOLIC PANEL
Anion gap: 8 (ref 5–15)
BUN: 16 mg/dL (ref 8–23)
CALCIUM: 8.8 mg/dL — AB (ref 8.9–10.3)
CO2: 26 mmol/L (ref 22–32)
Chloride: 103 mmol/L (ref 98–111)
Creatinine, Ser: 0.96 mg/dL (ref 0.44–1.00)
Glucose, Bld: 122 mg/dL — ABNORMAL HIGH (ref 70–99)
Potassium: 3.5 mmol/L (ref 3.5–5.1)
SODIUM: 137 mmol/L (ref 135–145)

## 2018-08-28 LAB — CBC
HCT: 41.8 % (ref 36.0–46.0)
Hemoglobin: 13.8 g/dL (ref 12.0–15.0)
MCH: 28.6 pg (ref 26.0–34.0)
MCHC: 33 g/dL (ref 30.0–36.0)
MCV: 86.7 fL (ref 80.0–100.0)
PLATELETS: 258 10*3/uL (ref 150–400)
RBC: 4.82 MIL/uL (ref 3.87–5.11)
RDW: 15.1 % (ref 11.5–15.5)
WBC: 6 10*3/uL (ref 4.0–10.5)
nRBC: 0 % (ref 0.0–0.2)

## 2018-08-28 LAB — TROPONIN I
Troponin I: <0.03 ng/mL (ref <0.03)
Troponin I: <0.03 ng/mL (ref <0.03)

## 2018-08-28 MED ORDER — HYDROCORTISONE NA SUCCINATE PF 250 MG IJ SOLR
200.0000 mg | Freq: Once | INTRAMUSCULAR | Status: DC
  Filled 2018-08-28: qty 200

## 2018-08-28 MED ORDER — DIPHENHYDRAMINE HCL 50 MG/ML IJ SOLN: 25.0000 mg | Freq: Once | INTRAMUSCULAR | Status: DC

## 2018-08-28 MED ORDER — IOHEXOL 350 MG/ML SOLN: 75.0000 mL | Freq: Once | INTRAVENOUS | Status: DC | PRN

## 2018-08-28 MED ORDER — DIPHENHYDRAMINE HCL 50 MG/ML IJ SOLN: 50.0000 mg | Freq: Once | INTRAMUSCULAR | Status: DC

## 2018-08-28 MED ORDER — LIDOCAINE 5 % EX PTCH
1.0000 | MEDICATED_PATCH | CUTANEOUS | Status: DC
  Administered 2018-08-28 (×2): 1 via TRANSDERMAL
  Filled 2018-08-28: qty 1

## 2018-08-28 MED ORDER — HYDROCODONE-ACETAMINOPHEN 5-325 MG PO TABS
1.0000 | ORAL_TABLET | Freq: Once | ORAL | Status: AC
  Administered 2018-08-28: 1 via ORAL
  Filled 2018-08-28: qty 1

## 2018-08-28 MED ORDER — LIDOCAINE 5 % EX PTCH
1.0000 | MEDICATED_PATCH | CUTANEOUS | Status: DC
  Filled 2018-08-28: qty 1

## 2018-08-28 MED ORDER — LIDOCAINE 5 % EX PTCH: 1.0000 | MEDICATED_PATCH | Freq: Two times a day (BID) | CUTANEOUS | 0 refills | Status: AC

## 2018-08-28 MED ORDER — DIPHENHYDRAMINE HCL 25 MG PO CAPS
50.0000 mg | ORAL_CAPSULE | Freq: Once | ORAL | Status: AC
  Administered 2018-08-28: 50 mg via ORAL
  Filled 2018-08-28: qty 2

## 2018-08-28 MED ORDER — DIPHENHYDRAMINE HCL 25 MG PO CAPS: 50.0000 mg | ORAL_CAPSULE | Freq: Once | ORAL | Status: DC

## 2018-08-28 NOTE — ED Triage Notes (Signed)
Pt states that she woke up this am with left sided chest pain and pain in the left scapulae, pt states that it is worse with movement, denies any rash to these areas, denies cough, states that she felt similar pain 40 years ago with pneumonia, pt states that she has been lifting and moving her mom a lot recently due to her illness

## 2018-08-28 NOTE — ED Provider Notes (Signed)
[facility] Emergency Department Provider Note    First MD Initiated Contact with Patient 08/28/18 1235     (approximate)  I have reviewed the triage vital signs and the nursing notes.   HISTORY  Chief Complaint Chest Pain    HPI Stacey Cline is a 63 y.o. female below listed past medical history presents with a chief complaint of sudden onset throbbing tearing pain through her left scapula.  States this occurred this morning was unable to move her left side due to severe pain.  Denies any numbness or tingling.  States the pain is since subsided but is still there.  It is reproduced with movement and taking deep inspiration.  Is never had pain quite like this but feels that the closest pain she had to it was when she was diagnosed with pneumonia 40 years ago.  Denies any history of CAD.  She denies any blood thinners.  Does have significant family history of hypertension and CAD.    Past Medical History:  Diagnosis Date  . Dyspnea   . Hypertension   . Leiomyoma of uterus, unspecified   . Menopause age 68  . Papanicolaou smear of cervix with atypical squamous cells of undetermined significance (ASC-US)   . Postsurgical hypothyroidism   . Pure hypercholesterolemia   . PVCs (premature ventricular contractions)   . Unspecified vitamin D deficiency   . Ventral hernia, unspecified, without mention of obstruction or gangrene   . Wears dentures    partial upper   Family History  Problem Relation Age of Onset  . Heart disease Father        CAD/3  AMI in 80's   . Stroke Father        several  . Lung disease Father   . Hypertension Mother   . Diabetes Mother   . Heart disease Mother 71       AMI several; first unsure age.  Pacemaker  . Hyperlipidemia Mother   . Kidney failure Mother   . Diabetes Sister   . Cancer Sister 84       Breast cancer  . Hypertension Brother   . Hypercholesterolemia Brother   . Stroke Sister   . Hypertension Sister     . Breast cancer Unknown   . Hypertension Unknown   . Diabetes type II Unknown    Past Surgical History:  Procedure Laterality Date  . BREAST BIOPSY  2006   Right  Benign  . CARDIAC CATHETERIZATION N/A 03/06/2016   Procedure: Right/Left Heart Cath and Coronary Angiography;  Surgeon: Wellington Hampshire, MD;  Location: [place] [facility];  Service: Cardiovascular;  Laterality: N/A;  . COLONOSCOPY WITH PROPOFOL N/A 04/14/2017   Procedure: COLONOSCOPY WITH PROPOFOL;  Surgeon: Lucilla Lame, MD;  Location: [place];  Service: Endoscopy;  Laterality: N/A;  . COSMETIC SURGERY     feet  . TONSILLECTOMY AND ADENOIDECTOMY  1960's  . TOTAL THYROIDECTOMY  2008   Patient Active Problem List   Diagnosis Date Noted  . Essential hypertension 07/17/2017  . Unstable angina ([place])   . Dyspnea and respiratory abnormality 02/08/2016  . Bradycardia 02/08/2016  . PVC's -- frequent with bigeminy 02/08/2016  . Pure hypercholesterolemia 09/28/2014  . Hypothyroidism due to acquired atrophy of thyroid 09/28/2014  . Glucose intolerance (impaired glucose tolerance) 09/28/2014      Prior to Admission medications   Medication Sig Start Date End Date Taking? Authorizing Provider  atorvastatin (LIPITOR) 20 MG tablet Take 1 tablet (  20 mg total) by mouth daily. 01/05/18  Yes Wardell Honour, MD  flecainide (TAMBOCOR) 100 MG tablet Take 1 tablet (100 mg total) by mouth 2 (two) times daily. 07/28/18  Yes Wellington Hampshire, MD  hydrochlorothiazide (HYDRODIURIL) 12.5 MG tablet Take 1 tablet (12.5 mg total) by mouth daily. 01/05/18  Yes Wardell Honour, MD  levothyroxine (SYNTHROID, LEVOTHROID) 75 MCG tablet Take 1 tablet (75 mcg) by mouth once daily on Saturday and Sunday   Yes [provider]  levothyroxine (SYNTHROID, LEVOTHROID) 88 MCG tablet Take 1 tablet (88 mcg) by mouth once daily Monday through Friday   Yes [provider]  lidocaine (LIDODERM) 5 % Place 1 patch onto the skin every 12  (twelve) hours. Remove & Discard patch within 12 hours or as directed by MD 08/28/18 08/28/19  Merlyn Lot, MD    Allergies Fish allergy; Garlic; Isovue [iopamidol]; Penicillins; and Shellfish allergy    Social History Social History   Tobacco Use  . Smoking status: Never Smoker  . Smokeless tobacco: Never Used  Substance Use Topics  . Alcohol use: No  . Drug use: No    Review of Systems Patient denies headaches, rhinorrhea, blurry vision, numbness, shortness of breath, chest pain, edema, cough, abdominal pain, nausea, vomiting, diarrhea, dysuria, fevers, rashes or hallucinations unless otherwise stated above in HPI. ____________________________________________   PHYSICAL EXAM:  VITAL SIGNS: Vitals:   08/28/18 1415 08/28/18 1430  BP:  (!) 142/78  Pulse: (!) 58 (!) 53  Resp:  18  Temp:    SpO2: 97% 99%    Constitutional: Alert and oriented.  Eyes: Conjunctivae are normal.  Head: Atraumatic. Nose: No congestion/rhinnorhea. Mouth/Throat: Mucous membranes are moist.   Neck: No stridor. Painless ROM.  Cardiovascular: Normal rate, regular rhythm. Grossly normal heart sounds.  Good peripheral circulation. Respiratory: Normal respiratory effort.  No retractions. Lungs CTAB. Gastrointestinal: Soft and nontender. No distention. No abdominal bruits. No CVA tenderness. Genitourinary:  Musculoskeletal: No lower extremity tenderness nor edema.  No joint effusions. Neurologic:  Normal speech and language. No gross focal neurologic deficits are appreciated. No facial droop Skin:  Skin is warm, dry and intact. No rash noted. Psychiatric: Mood and affect are normal. Speech and behavior are normal.  ____________________________________________   LABS (all labs ordered are listed, but only abnormal results are displayed)  Results for orders placed or performed during the hospital encounter of 08/28/18 (from the past 24 hour(s))  Basic metabolic panel     Status: Abnormal    Collection Time: 08/28/18  9:31 AM  Result Value Ref Range   Sodium 137 135 - 145 mmol/L   Potassium 3.5 3.5 - 5.1 mmol/L   Chloride 103 98 - 111 mmol/L   CO2 26 22 - 32 mmol/L   Glucose, Bld 122 (H) 70 - 99 mg/dL   BUN 16 8 - 23 mg/dL   Creatinine, Ser 0.96 0.44 - 1.00 mg/dL   Calcium 8.8 (L) 8.9 - 10.3 mg/dL   GFR calc non Af Amer >60 >60 mL/min   GFR calc Af Amer >60 >60 mL/min   Anion gap 8 5 - 15  CBC     Status: None   Collection Time: 08/28/18  9:31 AM  Result Value Ref Range   WBC 6.0 4.0 - 10.5 K/uL   RBC 4.82 3.87 - 5.11 MIL/uL   Hemoglobin 13.8 12.0 - 15.0 g/dL   HCT 41.8 36.0 - 46.0 %   MCV 86.7 80.0 - 100.0 fL  MCH 28.6 26.0 - 34.0 pg   MCHC 33.0 30.0 - 36.0 g/dL   RDW 15.1 11.5 - 15.5 %   Platelets 258 150 - 400 K/uL   nRBC 0.0 0.0 - 0.2 %  Troponin I - ONCE - STAT     Status: None   Collection Time: 08/28/18  9:31 AM  Result Value Ref Range   Troponin I <0.03 <0.03 ng/mL  Troponin I - ONCE - STAT     Status: None   Collection Time: 08/28/18  1:32 PM  Result Value Ref Range   Troponin I <0.03 <0.03 ng/mL   ____________________________________________  EKG My review and personal interpretation at Time: 9:19   Indication: chest pain  Rate: 65  Rhythm: sinus Axis: normal Other: normal intervals, no stemi ____________________________________________  RADIOLOGY  I personally reviewed all radiographic images ordered to evaluate for the above acute complaints and reviewed radiology reports and findings.  These findings were personally discussed with the patient.  Please see medical record for radiology report.  ____________________________________________   PROCEDURES  Procedure(s) performed:  Procedures    Critical Care performed: no ____________________________________________   INITIAL IMPRESSION / ASSESSMENT AND PLAN / ED COURSE  Pertinent labs & imaging results that were available during my care of the patient were reviewed by me and  considered in my medical decision making (see chart for details).   DDX: ACS, pericarditis, esophagitis, boerhaaves, pe, dissection, pna, bronchitis, costochondritis   Dayjah Selman is a 63 y.o. who presents to the ED with symptoms as described above.  Seems atypical for ACS.  She is afebrile and hemodynamically stable but given the pain ripping and tearing through to her left shoulder will order CTA to exclude dissection, aneurysm or other acute intrathoracic pathology.  Will give pain medication.  Will repeat troponin to further stratify.  Clinical Course as of Aug 28 1501  Fri Aug 28, 2018  1416 Repeat troponin is negative.   [PR]  5409 Patient's pain resolved after Lidoderm patch.  Seems to be primarily musculoskeletal.  Her repeat troponin is negative.  She remains hemodynamically stable and in no acute distress.  Doubt PE.  We discussed possibility and consideration of aortic dissection or aneurysm but also consider the fact that her pain was resolved with topical pain medication she has a normal chest x-ray with good pulses throughout without neuro deficits and her exam is reassuring.  Patient does not want CT angiogram at this point and demonstrates understanding that not performing this test limits my ability to exclude other acute emergent pathology.  Patient states that she is willing to accept those risks and would like to be discharged.  Have discussed with the patient and available family all diagnostics and treatments performed thus far and all questions were answered to the best of my ability. The patient demonstrates understanding and agreement with plan.    [PR]    Clinical Course User Index [PR] Merlyn Lot, MD     As part of my medical decision making, I reviewed the following data within the [place] notes reviewed and incorporated, Labs reviewed, notes from prior ED visits.   ____________________________________________   FINAL  CLINICAL IMPRESSION(S) / ED DIAGNOSES  Final diagnoses:  Chest pain, unspecified type  Acute pain of left shoulder      NEW MEDICATIONS STARTED DURING THIS VISIT:  New Prescriptions   LIDOCAINE (LIDODERM) 5 %    Place 1 patch onto the skin every 12 (twelve) hours. Remove &  Discard patch within 12 hours or as directed by MD     Note:  This document was prepared using Dragon voice recognition software and may include unintentional dictation errors.    Merlyn Lot, MD 08/28/18 [phone]

## 2018-09-18 ENCOUNTER — Other Ambulatory Visit: Payer: Self-pay | Admitting: General Practice

## 2018-09-18 NOTE — Telephone Encounter (Signed)
Copied from [place] [id number]. Topic: Quick Communication - Rx Refill/Question >> Sep 18, 2018 11:07 AM Virl Axe D wrote: Medication: hydrochlorothiazide (HYDRODIURIL) 12.5 MG tablet / atorvastatin (LIPITOR) 20 MG tablet   Has the patient contacted their pharmacy? Yes.   (Agent: If no, request that the patient contact the pharmacy for the refill.) (Agent: If yes, when and what did the pharmacy advise?)  Preferred Pharmacy (with phone number or street name): [id number] [place], [address] [phone] (Phone) [phone] (Fax)  Agent: Please be advised that RX refills may take up to 3 business days. We ask that you follow-up with your pharmacy.

## 2019-06-08 ENCOUNTER — Other Ambulatory Visit: Payer: Self-pay | Admitting: Family Medicine

## 2019-06-08 DIAGNOSIS — Z1231 Encounter for screening mammogram for malignant neoplasm of breast: Secondary | ICD-10-CM

## 2019-06-08 DIAGNOSIS — E2839 Other primary ovarian failure: Secondary | ICD-10-CM

## 2019-08-09 ENCOUNTER — Telehealth: Payer: Self-pay | Admitting: Internal Medicine

## 2019-08-09 MED ORDER — FLECAINIDE ACETATE 100 MG PO TABS: 100.0000 mg | ORAL_TABLET | Freq: Two times a day (BID) | ORAL | 0 refills | Status: DC

## 2019-08-09 NOTE — Telephone Encounter (Signed)
Please schedule F/U appointment with Dr. Klein. Thank you! °

## 2019-08-09 NOTE — Telephone Encounter (Signed)
Patient calling to schedule  Scheduled with Dr Caryl Comes 09/09/19 Patient is down to only 1 pill and would need refill before appointment

## 2019-08-09 NOTE — Telephone Encounter (Signed)
Requested Prescriptions   Signed Prescriptions Disp Refills   flecainide (TAMBOCOR) 100 MG tablet 180 tablet 0    Sig: Take 1 tablet (100 mg total) by mouth 2 (two) times daily.    Authorizing Provider: Deboraha Sprang    Ordering User: Raelene Bott, Dodi Leu L

## 2019-08-09 NOTE — Telephone Encounter (Signed)
*  STAT* If patient is at the pharmacy, call can be transferred to refill team.   1. Which medications need to be refilled? (please list name of each medication and dose if known)  Flecainide 100 mg po BID   2. Which pharmacy/location (including street and city if local pharmacy) is [facility] [address]   3. Do they need a 30 day or 90 day supply? [place]

## 2019-09-09 ENCOUNTER — Ambulatory Visit (INDEPENDENT_AMBULATORY_CARE_PROVIDER_SITE_OTHER): Payer: Medicare Other | Admitting: Internal Medicine

## 2019-09-09 ENCOUNTER — Other Ambulatory Visit: Payer: Self-pay

## 2019-09-09 ENCOUNTER — Encounter: Payer: Self-pay | Admitting: Internal Medicine

## 2019-09-09 VITALS — BP 134/80 | HR 77 | Ht 66.0 in | Wt 196.0 lb

## 2019-09-09 DIAGNOSIS — R0609 Other forms of dyspnea: Secondary | ICD-10-CM

## 2019-09-09 DIAGNOSIS — I493 Ventricular premature depolarization: Secondary | ICD-10-CM | POA: Diagnosis not present

## 2019-09-09 DIAGNOSIS — R0600 Dyspnea, unspecified: Secondary | ICD-10-CM | POA: Diagnosis not present

## 2019-09-09 NOTE — Progress Notes (Signed)
Patient Care Team: Wardell Honour, MD as PCP - General (Family Medicine)   HPI  Stacey Cline is a 64 y.o. 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 There have a somewhat unusual morphology with nondescript QRS in V1 and a superior axis.   There were frequent couplets and short runs on Holter monitoring. Burden was 41%.  They have persisted Despite calcium blockers. She was started her on flecainide.    Records and Results Reviewed  she saw Dr. Gaylyn Cheers in the interim. She was feeling significantly improved.  Repeat Holter monitor however demonstrated 37% PVCs   QRS morphology demonstrated significant fragmentation  Mostly PVCs queisceinct   Continues to struggle with some dyspnea.  But in her ordinary activities, it is not an issue.  No edema.  Sleep disordered breathing.  Some snoring  No chest pain; no lightheadedness or syncope. DATE TEST EF   8/17 Echo   55 %   MR  Mild mod  9/17 MRI  normal     Normal without DE MR moderateHe of course   7/17  CATH   55-60%     Normal Cors   5/18 Echo  55-65%           DATE PR interval QRSduration Dose  6/17 136 86   0   11/17 180 98 100  Flec  6/18 178 88 100  5/19 166 98 100  1/21  148  88  100      Past Medical History:  Diagnosis Date  . Dyspnea   . Hypertension   . Leiomyoma of uterus, unspecified   . Menopause age 33  . Papanicolaou smear of cervix with atypical squamous cells of undetermined significance (ASC-US)   . Postsurgical hypothyroidism   . Pure hypercholesterolemia   . PVCs (premature ventricular contractions)   . Unspecified vitamin D deficiency   . Ventral hernia, unspecified, without mention of obstruction or gangrene   . Wears dentures    partial upper    Past Surgical History:  Procedure Laterality Date  . BREAST BIOPSY  2006   Right  Benign  . CARDIAC CATHETERIZATION N/A 03/06/2016   Procedure: Right/Left Heart Cath and Coronary Angiography;   Surgeon: Wellington Hampshire, MD;  Location: [place] [facility];  Service: Cardiovascular;  Laterality: N/A;  . COLONOSCOPY WITH PROPOFOL N/A 04/14/2017   Procedure: COLONOSCOPY WITH PROPOFOL;  Surgeon: Lucilla Lame, MD;  Location: [place];  Service: Endoscopy;  Laterality: N/A;  . COSMETIC SURGERY     feet  . TONSILLECTOMY AND ADENOIDECTOMY  1960's  . TOTAL THYROIDECTOMY  2008    Current Outpatient Medications  Medication Sig Dispense Refill  . flecainide (TAMBOCOR) 100 MG tablet Take 1 tablet (100 mg total) by mouth 2 (two) times daily. 180 tablet 0  . levothyroxine (SYNTHROID, LEVOTHROID) 75 MCG tablet Take 1 tablet (75 mcg) by mouth once daily on Saturday and Sunday    . levothyroxine (SYNTHROID, LEVOTHROID) 88 MCG tablet Take 1 tablet (88 mcg) by mouth once daily Monday through Friday    . rosuvastatin (CRESTOR) 10 MG tablet Take 10 mg by mouth daily.    . hydrochlorothiazide (HYDRODIURIL) 12.5 MG tablet Take 1 tablet (12.5 mg total) by mouth daily. (Patient not taking: Reported on 09/09/2019) 90 tablet 1   No current facility-administered medications for this visit.    Allergies  Allergen Reactions  . Fish Allergy Hives and Itching  . Garlic  Swelling    angioedema  . Isovue [Iopamidol] Hives    Pt developed 1 hive on her chest and 2 on her neck area appx 10 minutes post contrast injection. Given 50 mg oral benadryl.  Needs 13 hour pre meds in the future.   Marland Kitchen Penicillins Rash    Has patient had a PCN reaction causing immediate rash, facial/tongue/throat swelling, SOB or lightheadedness with hypotension: Yes Has patient had a PCN reaction causing severe rash involving mucus membranes or skin necrosis: No Has patient had a PCN reaction that required hospitalization No Has patient had a PCN reaction occurring within the last 10 years: Yes If all of the above answers are "NO", then may proceed with Cephalosporin use.   . Shellfish Allergy Hives and Rash      Review of  Systems negative except from HPI and PMH  Physical Exam BP 134/80 (BP Location: Left Arm, Patient Position: Sitting, Cuff Size: Large)   Pulse 77   Ht 5\' 6"  (1.676 m)   Wt 196 lb (88.9 kg)   SpO2 97%   BMI 31.64 kg/m  Well developed and nourished in no acute distress HENT normal Neck supple  Clear Regular rate and rhythm, no murmurs or gallops Abd-soft with active BS No Clubbing cyanosis edema Skin-warm and dry A & Oriented  Grossly normal sensory and motor function  ECG sinus at 77 Intervals 15/09/36   Assessment and  Plan  PVCs-unusual morphology superior axis and a largely nondescript QRS morphology in lead V1- fragmented   Dyspnea on exertion  Sleep disordered breathing  Hypertension   Patient's blood pressures at home are in the 140 range.  Reviewed her echo report from 2018.  E/E' was normal.  Her CPX demonstrated significant hypertension reaction 138--230+.  We discussed beta-blockers for blood pressure and to help with her blood pressure reaction to exercise.  She is averse to taking more medication is interested in 20 to lose weight.  This might have an impact especially on this as well as her sleep disordered breathing.  Her diet is sodium replete.  Have encouraged her to transition to a new salt i.e. low sodium high potassium diet.  Continue her on flecainide.  PVCs are relatively quiet.  We spent more than 50% of our >25 min visit in face to face counseling regarding the above   Current medicines are reviewed at length with the patient today .  The patient does not  have concerns regarding medicines.

## 2019-09-09 NOTE — Patient Instructions (Addendum)
Medication Instructions:  - Your physician recommends that you continue on your current medications as directed. Please refer to the Current Medication list given to you today.  *If you need a refill on your cardiac medications before your next appointment, please call your pharmacy*  Lab Work: - none ordered  If you have labs (blood work) drawn today and your tests are completely normal, you will receive your results only by: Marland Kitchen MyChart Message (if you have MyChart) OR . A paper copy in the mail If you have any lab test that is abnormal or we need to change your treatment, we will call you to review the results.  Testing/Procedures: - none ordered  Follow-Up: At [facility], you and your health needs are our priority.  As part of our continuing mission to provide you with exceptional heart care, we have created designated Provider Care Teams.  These Care Teams include your primary Cardiologist (physician) and Advanced Practice Providers (APPs -  Physician Assistants and Nurse Practitioners) who all work together to provide you with the care you need, when you need it.  Your next appointment:   6 month(s)  The format for your next appointment:   In Person  Provider:   Virl Axe, MD  Other Instructions - Please try to work on diet and weight loss for improvement in your blood pressure   DASH Eating Plan DASH stands for "Dietary Approaches to Stop Hypertension." The DASH eating plan is a healthy eating plan that has been shown to reduce high blood pressure (hypertension). It may also reduce your risk for type 2 diabetes, heart disease, and stroke. The DASH eating plan may also help with weight loss. What are tips for following this plan?  General guidelines  Avoid eating more than 2,300 mg (milligrams) of salt (sodium) a day. If you have hypertension, you may need to reduce your sodium intake to 1,500 mg a day.  Limit alcohol intake to no more than 1 drink a day for  nonpregnant women and 2 drinks a day for men. One drink equals 12 oz of beer, 5 oz of wine, or 1 oz of hard liquor.  Work with your health care provider to maintain a healthy body weight or to lose weight. Ask what an ideal weight is for you.  Get at least 30 minutes of exercise that causes your heart to beat faster (aerobic exercise) most days of the week. Activities may include walking, swimming, or biking.  Work with your health care provider or diet and nutrition specialist (dietitian) to adjust your eating plan to your individual calorie needs. Reading food labels   Check food labels for the amount of sodium per serving. Choose foods with less than 5 percent of the Daily Value of sodium. Generally, foods with less than 300 mg of sodium per serving fit into this eating plan.  To find whole grains, look for the word "whole" as the first word in the ingredient list. Shopping  Buy products labeled as "low-sodium" or "no salt added."  Buy fresh foods. Avoid canned foods and premade or frozen meals. Cooking  Avoid adding salt when cooking. Use salt-free seasonings or herbs instead of table salt or sea salt. Check with your health care provider or pharmacist before using salt substitutes.  Do not fry foods. Cook foods using healthy methods such as baking, boiling, grilling, and broiling instead.  Cook with heart-healthy oils, such as olive, canola, soybean, or sunflower oil. Meal planning  Eat a balanced diet  that includes: ? 5 or more servings of fruits and vegetables each day. At each meal, try to fill half of your plate with fruits and vegetables. ? Up to 6-8 servings of whole grains each day. ? Less than 6 oz of lean meat, poultry, or fish each day. A 3-oz serving of meat is about the same size as a deck of cards. One egg equals 1 oz. ? 2 servings of low-fat dairy each day. ? A serving of nuts, seeds, or beans 5 times each week. ? Heart-healthy fats. Healthy fats called Omega-3  fatty acids are found in foods such as flaxseeds and coldwater fish, like sardines, salmon, and mackerel.  Limit how much you eat of the following: ? Canned or prepackaged foods. ? Food that is high in trans fat, such as fried foods. ? Food that is high in saturated fat, such as fatty meat. ? Sweets, desserts, sugary drinks, and other foods with added sugar. ? Full-fat dairy products.  Do not salt foods before eating.  Try to eat at least 2 vegetarian meals each week.  Eat more home-cooked food and less restaurant, buffet, and fast food.  When eating at a restaurant, ask that your food be prepared with less salt or no salt, if possible. What foods are recommended? The items listed may not be a complete list. Talk with your dietitian about what dietary choices are best for you. Grains Whole-grain or whole-wheat bread. Whole-grain or whole-wheat pasta. Brown rice. [place]. Bulgur. Whole-grain and low-sodium cereals. Pita bread. Low-fat, low-sodium crackers. Whole-wheat flour tortillas. Vegetables Fresh or frozen vegetables (raw, steamed, roasted, or grilled). Low-sodium or reduced-sodium tomato and vegetable juice. Low-sodium or reduced-sodium tomato sauce and tomato paste. Low-sodium or reduced-sodium canned vegetables. Fruits All fresh, dried, or frozen fruit. Canned fruit in natural juice (without added sugar). Meat and other protein foods Skinless chicken or [place]. Ground chicken or [place]. Pork with fat trimmed off. Fish and seafood. Egg whites. Dried beans, peas, or lentils. Unsalted nuts, nut butters, and seeds. Unsalted canned beans. Lean cuts of beef with fat trimmed off. Low-sodium, lean deli meat. Dairy Low-fat (1%) or fat-free (skim) milk. Fat-free, low-fat, or reduced-fat cheeses. Nonfat, low-sodium ricotta or cottage cheese. Low-fat or nonfat yogurt. Low-fat, low-sodium cheese. Fats and oils Soft margarine without trans fats. Vegetable oil. Low-fat, reduced-fat, or  light mayonnaise and salad dressings (reduced-sodium). Canola, safflower, olive, soybean, and sunflower oils. Avocado. Seasoning and other foods Herbs. Spices. Seasoning mixes without salt. Unsalted popcorn and pretzels. Fat-free sweets. What foods are not recommended? The items listed may not be a complete list. Talk with your dietitian about what dietary choices are best for you. Grains Baked goods made with fat, such as croissants, muffins, or some breads. Dry pasta or rice meal packs. Vegetables Creamed or fried vegetables. Vegetables in a cheese sauce. Regular canned vegetables (not low-sodium or reduced-sodium). Regular canned tomato sauce and paste (not low-sodium or reduced-sodium). Regular tomato and vegetable juice (not low-sodium or reduced-sodium). Angie Fava. Olives. Fruits Canned fruit in a light or heavy syrup. Fried fruit. Fruit in cream or butter sauce. Meat and other protein foods Fatty cuts of meat. Ribs. Fried meat. Berniece Salines. Sausage. Bologna and other processed lunch meats. Salami. Fatback. Hotdogs. Bratwurst. Salted nuts and seeds. Canned beans with added salt. Canned or smoked fish. Whole eggs or egg yolks. Chicken or [place] with skin. Dairy Whole or 2% milk, cream, and half-and-half. Whole or full-fat cream cheese. Whole-fat or sweetened yogurt. Full-fat cheese. Nondairy creamers.  Whipped toppings. Processed cheese and cheese spreads. Fats and oils Butter. Stick margarine. Lard. Shortening. Ghee. Bacon fat. Tropical oils, such as coconut, palm kernel, or palm oil. Seasoning and other foods Salted popcorn and pretzels. Onion salt, garlic salt, seasoned salt, table salt, and sea salt. Worcestershire sauce. Tartar sauce. Barbecue sauce. Teriyaki sauce. Soy sauce, including reduced-sodium. Steak sauce. Canned and packaged gravies. Fish sauce. Oyster sauce. Cocktail sauce. Horseradish that you find on the shelf. Ketchup. Mustard. Meat flavorings and tenderizers. Bouillon cubes. Hot  sauce and Tabasco sauce. Premade or packaged marinades. Premade or packaged taco seasonings. Relishes. Regular salad dressings. Where to find more information:  National Heart, Lung, and [place]: https://wilson-eaton.com/  American Heart Association: www.heart.org Summary  The DASH eating plan is a healthy eating plan that has been shown to reduce high blood pressure (hypertension). It may also reduce your risk for type 2 diabetes, heart disease, and stroke.  With the DASH eating plan, you should limit salt (sodium) intake to 2,300 mg a day. If you have hypertension, you may need to reduce your sodium intake to 1,500 mg a day.  When on the DASH eating plan, aim to eat more fresh fruits and vegetables, whole grains, lean proteins, low-fat dairy, and heart-healthy fats.  Work with your health care provider or diet and nutrition specialist (dietitian) to adjust your eating plan to your individual calorie needs. This information is not intended to replace advice given to you by your health care provider. Make sure you discuss any questions you have with your health care provider. Document Revised: 07/18/2017 Document Reviewed: 07/29/2016 Elsevier Patient Education  2020 [employer].

## 2019-10-31 ENCOUNTER — Other Ambulatory Visit: Payer: Self-pay | Admitting: Internal Medicine

## 2020-01-26 ENCOUNTER — Other Ambulatory Visit: Payer: Self-pay | Admitting: Internal Medicine

## 2020-01-27 NOTE — Telephone Encounter (Signed)
This is a [place] pt 

## 2020-03-09 ENCOUNTER — Encounter: Payer: Self-pay | Admitting: Internal Medicine

## 2020-03-09 ENCOUNTER — Other Ambulatory Visit: Payer: Self-pay

## 2020-03-09 ENCOUNTER — Ambulatory Visit (INDEPENDENT_AMBULATORY_CARE_PROVIDER_SITE_OTHER): Payer: Medicare Other | Admitting: Internal Medicine

## 2020-03-09 VITALS — BP 138/80 | HR 73 | Ht 66.0 in | Wt 192.0 lb

## 2020-03-09 DIAGNOSIS — R0600 Dyspnea, unspecified: Secondary | ICD-10-CM

## 2020-03-09 DIAGNOSIS — I10 Essential (primary) hypertension: Secondary | ICD-10-CM | POA: Diagnosis not present

## 2020-03-09 DIAGNOSIS — I493 Ventricular premature depolarization: Secondary | ICD-10-CM | POA: Diagnosis not present

## 2020-03-09 DIAGNOSIS — R0609 Other forms of dyspnea: Secondary | ICD-10-CM

## 2020-03-09 NOTE — Patient Instructions (Signed)

## 2020-03-09 NOTE — Progress Notes (Signed)
Patient Care Team: Wardell Honour, MD as PCP - General (Family Medicine)   HPI  Stacey Cline is a 64 y.o. 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 There have a somewhat unusual morphology with nondescript QRS in V1 and a superior axis.   There were frequent couplets and short runs on Holter monitoring. Burden was 41%.  They have persisted Despite calcium blockers. She was started her on flecainide.    Records and Results Reviewed  she saw Dr. Gaylyn Cheers in the interim. She was feeling significantly improved.  Repeat Holter monitor however demonstrated 37% PVCs   QRS morphology demonstrated significant fragmentation  Mostly PVCs queisceinct   Still with dyspnea, esp on stairs  no chest pain edema LH or palps  Otherwise mentally feels much better     DATE TEST EF   8/17 Echo   55 %   MR  Mild mod  9/17 MRI  normal     Normal without DE MR moderateHe of course   7/17  CATH   55-60%     Normal Cors   5/18 Echo  55-65%           DATE PR interval QRSduration Dose  6/17 136 86   0   11/17 180 98 100  Flec  6/18 178 88 100  5/19 166 98 100  1/21  148  88  100  7/21  144 86 100    Date Cr K Hgb  5/21 1.0 4.0              Past Medical History:  Diagnosis Date  . Dyspnea   . Hypertension   . Leiomyoma of uterus, unspecified   . Menopause age 81  . Papanicolaou smear of cervix with atypical squamous cells of undetermined significance (ASC-US)   . Postsurgical hypothyroidism   . Pure hypercholesterolemia   . PVCs (premature ventricular contractions)   . Unspecified vitamin D deficiency   . Ventral hernia, unspecified, without mention of obstruction or gangrene   . Wears dentures    partial upper    Past Surgical History:  Procedure Laterality Date  . BREAST BIOPSY  2006   Right  Benign  . CARDIAC CATHETERIZATION N/A 03/06/2016   Procedure: Right/Left Heart Cath and Coronary Angiography;  Surgeon: Wellington Hampshire,  MD;  Location: [facility];  Service: Cardiovascular;  Laterality: N/A;  . COLONOSCOPY WITH PROPOFOL N/A 04/14/2017   Procedure: COLONOSCOPY WITH PROPOFOL;  Surgeon: Lucilla Lame, MD;  Location: [facility] [facility];  Service: Endoscopy;  Laterality: N/A;  . COSMETIC SURGERY     feet  . TONSILLECTOMY AND ADENOIDECTOMY  1960's  . TOTAL THYROIDECTOMY  2008    Current Outpatient Medications  Medication Sig Dispense Refill  . flecainide (TAMBOCOR) 100 MG tablet TAKE 1 TABLET BY MOUTH TWICE A DAY 180 tablet 0  . levothyroxine (SYNTHROID, LEVOTHROID) 75 MCG tablet Take 1 tablet (75 mcg) by mouth once daily on Saturday and Sunday    . levothyroxine (SYNTHROID, LEVOTHROID) 88 MCG tablet Take 1 tablet (88 mcg) by mouth once daily Monday through Friday    . rosuvastatin (CRESTOR) 10 MG tablet Take 10 mg by mouth daily.     No current facility-administered medications for this visit.    Allergies  Allergen Reactions  . Fish Allergy Hives and Itching  . Garlic Swelling    angioedema  . Isovue [Iopamidol] Hives    Pt developed  1 hive on her chest and 2 on her neck area appx 10 minutes post contrast injection. Given 50 mg oral benadryl.  Needs 13 hour pre meds in the future.   Marland Kitchen Penicillins Rash    Has patient had a PCN reaction causing immediate rash, facial/tongue/throat swelling, SOB or lightheadedness with hypotension: Yes Has patient had a PCN reaction causing severe rash involving mucus membranes or skin necrosis: No Has patient had a PCN reaction that required hospitalization No Has patient had a PCN reaction occurring within the last 10 years: Yes If all of the above answers are "NO", then may proceed with Cephalosporin use.   . Shellfish Allergy Hives and Rash      Review of Systems negative except from HPI and PMH  Physical Exam BP (!) 138/80 (BP Location: Left Arm, Patient Position: Sitting, Cuff Size: Normal)   Pulse 73   Ht 5\' 6"  (1.676 m)   Wt 192 lb (87.1 kg)    SpO2 98%   BMI 30.99 kg/m  Well developed and nourished in no acute distress HENT normal Neck supple with JVP-  Flat  Clear Regular rate and rhythm, no murmurs or gallops Abd-soft with active BS No Clubbing cyanosis edema Skin-warm and dry A & Oriented  Grossly normal sensory and motor function  ECG sinus @ 73 14/09/40    Assessment and  Plan  PVCs-unusual morphology superior axis and a largely nondescript QRS morphology in lead V1- fragmented   Dyspnea on exertion  Hypertension  PVCs quiescient  BP reasonably controlled   Continue flecainide  Impressed at her exercise intolerance still on the stairs  Have encouraged intentional exercise program  She is engaged with the idea   If not able to make progress will undertake repeat cardiac evalulation   Current medicines are reviewed at length with the patient today .  The patient does not  have concerns regarding medicines.

## 2020-04-21 ENCOUNTER — Other Ambulatory Visit: Payer: Self-pay | Admitting: Family Medicine

## 2020-04-21 DIAGNOSIS — R1084 Generalized abdominal pain: Secondary | ICD-10-CM

## 2020-04-21 DIAGNOSIS — R6881 Early satiety: Secondary | ICD-10-CM

## 2020-04-28 ENCOUNTER — Ambulatory Visit
Admission: RE | Admit: 2020-04-28 | Discharge: 2020-04-28 | Disposition: A | Discharge status: Home / Self Care | Payer: Medicare Other | Source: Ambulatory Visit | Attending: Family Medicine | Admitting: Family Medicine

## 2020-04-28 ENCOUNTER — Other Ambulatory Visit: Payer: Self-pay

## 2020-04-28 DIAGNOSIS — R6881 Early satiety: Secondary | ICD-10-CM | POA: Diagnosis present

## 2020-04-28 DIAGNOSIS — R1084 Generalized abdominal pain: Secondary | ICD-10-CM

## 2020-05-01 ENCOUNTER — Ambulatory Visit (INDEPENDENT_AMBULATORY_CARE_PROVIDER_SITE_OTHER): Payer: Medicare Other | Admitting: Gastroenterology

## 2020-05-01 ENCOUNTER — Other Ambulatory Visit: Payer: Self-pay

## 2020-05-01 VITALS — BP 131/79 | HR 76 | Ht 66.0 in | Wt 191.8 lb

## 2020-05-01 DIAGNOSIS — R6881 Early satiety: Secondary | ICD-10-CM | POA: Diagnosis not present

## 2020-05-01 NOTE — H&P (View-Only) (Signed)
Gastroenterology Consultation  Referring Provider:     Wardell Honour, MD Primary Care Physician:  Wardell Honour, MD Primary Gastroenterologist:  Dr. Allen Norris     Reason for Consultation:     Early satiety        HPI:   Stacey Cline is a 64 y.o. y/o female referred for consultation & management of Early satiety by Dr. Tamala Julian, [name], MD.  This patient comes in to see me after seeing me in the past for colonoscopy.  The patient now comes in with a report of early satiety.  The patient's early satiety is reported to be worse with solids.  She is now mainly having milk shakes and other high-calorie liquids to supplement her diet.  She reports that after she eats she feels like she is full very quickly.  She also states that she has abdominal pain after eating.  The patient denies any black stools or bloody stools.  She also denies any nausea or vomiting.  Past Medical History:  Diagnosis Date  . Dyspnea   . Hypertension   . Leiomyoma of uterus, unspecified   . Menopause age 19  . Papanicolaou smear of cervix with atypical squamous cells of undetermined significance (ASC-US)   . Postsurgical hypothyroidism   . Pure hypercholesterolemia   . PVCs (premature ventricular contractions)   . Unspecified vitamin D deficiency   . Ventral hernia, unspecified, without mention of obstruction or gangrene   . Wears dentures    partial upper    Past Surgical History:  Procedure Laterality Date  . BREAST BIOPSY  2006   Right  Benign  . CARDIAC CATHETERIZATION N/A 03/06/2016   Procedure: Right/Left Heart Cath and Coronary Angiography;  Surgeon: Wellington Hampshire, MD;  Location: [facility];  Service: Cardiovascular;  Laterality: N/A;  . COLONOSCOPY WITH PROPOFOL N/A 04/14/2017   Procedure: COLONOSCOPY WITH PROPOFOL;  Surgeon: Lucilla Lame, MD;  Location: [facility];  Service: Endoscopy;  Laterality: N/A;  . COSMETIC SURGERY     feet  . TONSILLECTOMY AND ADENOIDECTOMY  1960's    . TOTAL THYROIDECTOMY  2008    Prior to Admission medications   Medication Sig Start Date End Date Taking? Authorizing Provider  flecainide (TAMBOCOR) 100 MG tablet TAKE 1 TABLET BY MOUTH TWICE A DAY 01/27/20   Deboraha Sprang, MD  levothyroxine (SYNTHROID, LEVOTHROID) 75 MCG tablet Take 1 tablet (75 mcg) by mouth once daily on Saturday and Sunday    [provider]  levothyroxine (SYNTHROID, LEVOTHROID) 88 MCG tablet Take 1 tablet (88 mcg) by mouth once daily Monday through Friday    [provider]  rosuvastatin (CRESTOR) 10 MG tablet Take 10 mg by mouth daily. 06/03/19   [provider]    Family History  Problem Relation Age of Onset  . Heart disease Father        CAD/3  AMI in 38's   . Stroke Father        several  . Lung disease Father   . Hypertension Mother   . Diabetes Mother   . Heart disease Mother 8       AMI several; first unsure age.  Pacemaker  . Hyperlipidemia Mother   . Kidney failure Mother   . Diabetes Sister   . Cancer Sister 57       Breast cancer  . Hypertension Brother   . Hypercholesterolemia Brother   . Stroke Sister   . Hypertension Sister   .  Breast cancer Other   . Hypertension Other   . Diabetes type II Other      Social History   Tobacco Use  . Smoking status: Never Smoker  . Smokeless tobacco: Never Used  Vaping Use  . Vaping Use: Never used  Substance Use Topics  . Alcohol use: No  . Drug use: No    Allergies as of 05/01/2020 - Review Complete 05/01/2020  Allergen Reaction Noted  . Fish allergy Hives and Itching 06/03/2013  . Garlic Swelling 19/62/2297  . Isovue [iopamidol] Hives 02/09/2016  . Penicillins Rash 04/28/2013  . Shellfish allergy Hives and Rash 03/05/2016    Review of Systems:    All systems reviewed and negative except where noted in HPI.   Physical Exam:  BP 131/79   Pulse 76   Ht 5\' 6"  (1.676 m)   Wt 191 lb 12.8 oz (87 kg)   BMI 30.96 kg/m  No LMP recorded. Patient is  postmenopausal. General:   Alert,  Well-developed, well-nourished, pleasant and cooperative in NAD Head:  Normocephalic and atraumatic. Eyes:  Sclera clear, no icterus.   Conjunctiva pink. Ears:  Normal auditory acuity. Neck:  Supple; no masses or thyromegaly. Neurologic:  Alert and oriented x3;  grossly normal neurologically. Skin:  Intact without significant lesions or rashes.  No jaundice. Lymph Nodes:  No significant cervical adenopathy. Psych:  Alert and cooperative. Normal mood and affect.  Imaging Studies: US Abdomen Complete  Result Date: 04/28/2020 CLINICAL DATA:  Abdomen pain with early satiety EXAM: ABDOMEN ULTRASOUND COMPLETE COMPARISON:  Ultrasound 05/06/2013 FINDINGS: Gallbladder: No gallstones or wall thickening visualized. No sonographic Murphy sign noted by sonographer. Common bile duct: Diameter: 4.1 mm Liver: Increased echogenicity. No focal hepatic abnormality. Portal vein is patent on color Doppler imaging with normal direction of blood flow towards the liver. IVC: No abnormality visualized. Pancreas: Visualized portion unremarkable. Spleen: Size and appearance within normal limits. Right Kidney: Length: 8.9 cm. Echogenicity within normal limits. No mass or hydronephrosis visualized. Left Kidney: Length: 8.5 cm. Echogenicity within normal limits. No mass or hydronephrosis visualized. Abdominal aorta: No aneurysm visualized. Other findings: None. IMPRESSION: Negative examination Electronically Signed   By: Donavan Foil M.D.   On: 04/28/2020 15:17    Assessment and Plan:   Stacey Cline is a 64 y.o. y/o female who comes in with early satiety.  The patient has been seen by me in the past for a colonoscopy.  The patient has not had any weight loss but states she has modified her diet due to her present issues. The patient will be set up for an upper endoscopy to look for source of her early satiety.  The patient has been explained the plan and agrees with it.    Lucilla Lame, MD. Marval Regal    Note: This dictation was prepared with Dragon dictation along with smaller phrase technology. Any transcriptional errors that result from this process are unintentional.

## 2020-05-01 NOTE — Progress Notes (Signed)
Gastroenterology Consultation  Referring Provider:     Wardell Honour, MD Primary Care Physician:  Wardell Honour, MD Primary Gastroenterologist:  Dr. Allen Norris     Reason for Consultation:     Early satiety        HPI:   Stacey Cline is a 64 y.o. y/o female referred for consultation & management of Early satiety by Dr. Tamala Julian, [name], MD.  This patient comes in to see me after seeing me in the past for colonoscopy.  The patient now comes in with a report of early satiety.  The patient's early satiety is reported to be worse with solids.  She is now mainly having milk shakes and other high-calorie liquids to supplement her diet.  She reports that after she eats she feels like she is full very quickly.  She also states that she has abdominal pain after eating.  The patient denies any black stools or bloody stools.  She also denies any nausea or vomiting.  Past Medical History:  Diagnosis Date  . Dyspnea   . Hypertension   . Leiomyoma of uterus, unspecified   . Menopause age 33  . Papanicolaou smear of cervix with atypical squamous cells of undetermined significance (ASC-US)   . Postsurgical hypothyroidism   . Pure hypercholesterolemia   . PVCs (premature ventricular contractions)   . Unspecified vitamin D deficiency   . Ventral hernia, unspecified, without mention of obstruction or gangrene   . Wears dentures    partial upper    Past Surgical History:  Procedure Laterality Date  . BREAST BIOPSY  2006   Right  Benign  . CARDIAC CATHETERIZATION N/A 03/06/2016   Procedure: Right/Left Heart Cath and Coronary Angiography;  Surgeon: Wellington Hampshire, MD;  Location: [facility];  Service: Cardiovascular;  Laterality: N/A;  . COLONOSCOPY WITH PROPOFOL N/A 04/14/2017   Procedure: COLONOSCOPY WITH PROPOFOL;  Surgeon: Lucilla Lame, MD;  Location: [place];  Service: Endoscopy;  Laterality: N/A;  . COSMETIC SURGERY     feet  . TONSILLECTOMY AND ADENOIDECTOMY  1960's    . TOTAL THYROIDECTOMY  2008    Prior to Admission medications   Medication Sig Start Date End Date Taking? Authorizing Provider  flecainide (TAMBOCOR) 100 MG tablet TAKE 1 TABLET BY MOUTH TWICE A DAY 01/27/20   Deboraha Sprang, MD  levothyroxine (SYNTHROID, LEVOTHROID) 75 MCG tablet Take 1 tablet (75 mcg) by mouth once daily on Saturday and Sunday    [provider]  levothyroxine (SYNTHROID, LEVOTHROID) 88 MCG tablet Take 1 tablet (88 mcg) by mouth once daily Monday through Friday    [provider]  rosuvastatin (CRESTOR) 10 MG tablet Take 10 mg by mouth daily. 06/03/19   [provider]    Family History  Problem Relation Age of Onset  . Heart disease Father        CAD/3  AMI in 45's   . Stroke Father        several  . Lung disease Father   . Hypertension Mother   . Diabetes Mother   . Heart disease Mother 30       AMI several; first unsure age.  Pacemaker  . Hyperlipidemia Mother   . Kidney failure Mother   . Diabetes Sister   . Cancer Sister 31       Breast cancer  . Hypertension Brother   . Hypercholesterolemia Brother   . Stroke Sister   . Hypertension Sister   .  Breast cancer Other   . Hypertension Other   . Diabetes type II Other      Social History   Tobacco Use  . Smoking status: Never Smoker  . Smokeless tobacco: Never Used  Vaping Use  . Vaping Use: Never used  Substance Use Topics  . Alcohol use: No  . Drug use: No    Allergies as of 05/01/2020 - Review Complete 05/01/2020  Allergen Reaction Noted  . Fish allergy Hives and Itching 06/03/2013  . Garlic Swelling 83/38/2505  . Isovue [iopamidol] Hives 02/09/2016  . Penicillins Rash 04/28/2013  . Shellfish allergy Hives and Rash 03/05/2016    Review of Systems:    All systems reviewed and negative except where noted in HPI.   Physical Exam:  BP 131/79   Pulse 76   Ht 5\' 6"  (1.676 m)   Wt 191 lb 12.8 oz (87 kg)   BMI 30.96 kg/m  No LMP recorded. Patient is  postmenopausal. General:   Alert,  Well-developed, well-nourished, pleasant and cooperative in NAD Head:  Normocephalic and atraumatic. Eyes:  Sclera clear, no icterus.   Conjunctiva pink. Ears:  Normal auditory acuity. Neck:  Supple; no masses or thyromegaly. Neurologic:  Alert and oriented x3;  grossly normal neurologically. Skin:  Intact without significant lesions or rashes.  No jaundice. Lymph Nodes:  No significant cervical adenopathy. Psych:  Alert and cooperative. Normal mood and affect.  Imaging Studies: US Abdomen Complete  Result Date: 04/28/2020 CLINICAL DATA:  Abdomen pain with early satiety EXAM: ABDOMEN ULTRASOUND COMPLETE COMPARISON:  Ultrasound 05/06/2013 FINDINGS: Gallbladder: No gallstones or wall thickening visualized. No sonographic Murphy sign noted by sonographer. Common bile duct: Diameter: 4.1 mm Liver: Increased echogenicity. No focal hepatic abnormality. Portal vein is patent on color Doppler imaging with normal direction of blood flow towards the liver. IVC: No abnormality visualized. Pancreas: Visualized portion unremarkable. Spleen: Size and appearance within normal limits. Right Kidney: Length: 8.9 cm. Echogenicity within normal limits. No mass or hydronephrosis visualized. Left Kidney: Length: 8.5 cm. Echogenicity within normal limits. No mass or hydronephrosis visualized. Abdominal aorta: No aneurysm visualized. Other findings: None. IMPRESSION: Negative examination Electronically Signed   By: Donavan Foil M.D.   On: 04/28/2020 15:17    Assessment and Plan:   Stacey Cline is a 64 y.o. y/o female who comes in with early satiety.  The patient has been seen by me in the past for a colonoscopy.  The patient has not had any weight loss but states she has modified her diet due to her present issues. The patient will be set up for an upper endoscopy to look for source of her early satiety.  The patient has been explained the plan and agrees with it.    Lucilla Lame, MD. Marval Regal    Note: This dictation was prepared with Dragon dictation along with smaller phrase technology. Any transcriptional errors that result from this process are unintentional.

## 2020-05-02 ENCOUNTER — Other Ambulatory Visit: Payer: Self-pay

## 2020-05-02 ENCOUNTER — Encounter: Payer: Self-pay | Admitting: Gastroenterology

## 2020-05-03 ENCOUNTER — Other Ambulatory Visit
Admission: RE | Admit: 2020-05-03 | Discharge: 2020-05-03 | Disposition: A | Discharge status: Home / Self Care | Payer: Medicare Other | Source: Ambulatory Visit | Attending: Gastroenterology | Admitting: Gastroenterology

## 2020-05-03 ENCOUNTER — Other Ambulatory Visit: Payer: Self-pay | Admitting: Internal Medicine

## 2020-05-03 DIAGNOSIS — Z01812 Encounter for preprocedural laboratory examination: Secondary | ICD-10-CM | POA: Insufficient documentation

## 2020-05-03 DIAGNOSIS — Z20822 Contact with and (suspected) exposure to covid-19: Secondary | ICD-10-CM | POA: Diagnosis not present

## 2020-05-04 LAB — SARS CORONAVIRUS 2 (TAT 6-24 HRS): SARS Coronavirus 2: NEGATIVE

## 2020-05-04 NOTE — Discharge Instructions (Signed)
General Anesthesia, Adult, Care After This sheet gives you information about how to care for yourself after your procedure. Your health care provider may also give you more specific instructions. If you have problems or questions, contact your health care provider. What can I expect after the procedure? After the procedure, the following side effects are common:  Pain or discomfort at the IV site.  Nausea.  Vomiting.  Sore throat.  Trouble concentrating.  Feeling cold or chills.  Weak or tired.  Sleepiness and fatigue.  Soreness and body aches. These side effects can affect parts of the body that were not involved in surgery. Follow these instructions at home:  For at least 24 hours after the procedure:  Have a responsible adult stay with you. It is important to have someone help care for you until you are awake and alert.  Rest as needed.  Do not: ? Participate in activities in which you could fall or become injured. ? Drive. ? Use heavy machinery. ? Drink alcohol. ? Take sleeping pills or medicines that cause drowsiness. ? Make important decisions or sign legal documents. ? Take care of children on your own. Eating and drinking  Follow any instructions from your health care provider about eating or drinking restrictions.  When you feel hungry, start by eating small amounts of foods that are soft and easy to digest (bland), such as toast. Gradually return to your regular diet.  Drink enough fluid to keep your urine pale yellow.  If you vomit, rehydrate by drinking water, juice, or clear broth. General instructions  If you have sleep apnea, surgery and certain medicines can increase your risk for breathing problems. Follow instructions from your health care provider about wearing your sleep device: ? Anytime you are sleeping, including during daytime naps. ? While taking prescription pain medicines, sleeping medicines, or medicines that make you drowsy.  Return to  your normal activities as told by your health care provider. Ask your health care provider what activities are safe for you.  Take over-the-counter and prescription medicines only as told by your health care provider.  If you smoke, do not smoke without supervision.  Keep all follow-up visits as told by your health care provider. This is important. Contact a health care provider if:  You have nausea or vomiting that does not get better with medicine.  You cannot eat or drink without vomiting.  You have pain that does not get better with medicine.  You are unable to pass urine.  You develop a skin rash.  You have a fever.  You have redness around your IV site that gets worse. Get help right away if:  You have difficulty breathing.  You have chest pain.  You have blood in your urine or stool, or you vomit blood. Summary  After the procedure, it is common to have a sore throat or nausea. It is also common to feel tired.  Have a responsible adult stay with you for the first 24 hours after general anesthesia. It is important to have someone help care for you until you are awake and alert.  When you feel hungry, start by eating small amounts of foods that are soft and easy to digest (bland), such as toast. Gradually return to your regular diet.  Drink enough fluid to keep your urine pale yellow.  Return to your normal activities as told by your health care provider. Ask your health care provider what activities are safe for you. This information is not   intended to replace advice given to you by your health care provider. Make sure you discuss any questions you have with your health care provider. Document Revised: 08/08/2017 Document Reviewed: 03/21/2017 Elsevier Patient Education  2020 Elsevier Inc.  

## 2020-05-05 ENCOUNTER — Ambulatory Visit
Admission: RE | Admit: 2020-05-05 | Discharge: 2020-05-05 | Disposition: A | Discharge status: Home / Self Care | Payer: Medicare Other | Attending: Gastroenterology | Admitting: Gastroenterology

## 2020-05-05 ENCOUNTER — Other Ambulatory Visit: Payer: Self-pay

## 2020-05-05 ENCOUNTER — Encounter: Payer: Self-pay | Admitting: Gastroenterology

## 2020-05-05 ENCOUNTER — Ambulatory Visit: Payer: Medicare Other | Admitting: Anesthesiology

## 2020-05-05 ENCOUNTER — Encounter
Admission: RE | Disposition: A | Discharge status: Home / Self Care | Payer: Self-pay | Source: Home / Self Care | Attending: Gastroenterology

## 2020-05-05 DIAGNOSIS — K319 Disease of stomach and duodenum, unspecified: Secondary | ICD-10-CM | POA: Diagnosis not present

## 2020-05-05 DIAGNOSIS — E890 Postprocedural hypothyroidism: Secondary | ICD-10-CM | POA: Diagnosis not present

## 2020-05-05 DIAGNOSIS — Z79890 Hormone replacement therapy (postmenopausal): Secondary | ICD-10-CM | POA: Insufficient documentation

## 2020-05-05 DIAGNOSIS — Z880 Allergy status to penicillin: Secondary | ICD-10-CM | POA: Diagnosis not present

## 2020-05-05 DIAGNOSIS — R6881 Early satiety: Secondary | ICD-10-CM | POA: Diagnosis not present

## 2020-05-05 DIAGNOSIS — I10 Essential (primary) hypertension: Secondary | ICD-10-CM | POA: Diagnosis not present

## 2020-05-05 DIAGNOSIS — Z888 Allergy status to other drugs, medicaments and biological substances status: Secondary | ICD-10-CM | POA: Diagnosis not present

## 2020-05-05 DIAGNOSIS — E7800 Pure hypercholesterolemia, unspecified: Secondary | ICD-10-CM | POA: Insufficient documentation

## 2020-05-05 DIAGNOSIS — K2100 Gastro-esophageal reflux disease with esophagitis, without bleeding: Secondary | ICD-10-CM | POA: Diagnosis not present

## 2020-05-05 DIAGNOSIS — Z6831 Body mass index (BMI) 31.0-31.9, adult: Secondary | ICD-10-CM | POA: Diagnosis not present

## 2020-05-05 DIAGNOSIS — Z91013 Allergy to seafood: Secondary | ICD-10-CM | POA: Diagnosis not present

## 2020-05-05 DIAGNOSIS — K209 Esophagitis, unspecified without bleeding: Secondary | ICD-10-CM | POA: Diagnosis not present

## 2020-05-05 DIAGNOSIS — Z79899 Other long term (current) drug therapy: Secondary | ICD-10-CM | POA: Diagnosis not present

## 2020-05-05 HISTORY — PX: ESOPHAGOGASTRODUODENOSCOPY (EGD) WITH PROPOFOL: SHX5813

## 2020-05-05 SURGERY — ESOPHAGOGASTRODUODENOSCOPY (EGD) WITH PROPOFOL
Anesthesia: Choice

## 2020-05-05 MED ORDER — LIDOCAINE HCL (CARDIAC) PF 100 MG/5ML IV SOSY
PREFILLED_SYRINGE | INTRAVENOUS | Status: DC | PRN
  Administered 2020-05-05: 30 mg via INTRAVENOUS

## 2020-05-05 MED ORDER — LACTATED RINGERS IV SOLN: INTRAVENOUS | Status: DC

## 2020-05-05 MED ORDER — OXYCODONE HCL 5 MG/5ML PO SOLN: 5.0000 mg | Freq: Once | ORAL | Status: DC | PRN

## 2020-05-05 MED ORDER — PROPOFOL 10 MG/ML IV BOLUS
INTRAVENOUS | Status: DC | PRN
  Administered 2020-05-05: 200 mg via INTRAVENOUS

## 2020-05-05 MED ORDER — GLYCOPYRROLATE 0.2 MG/ML IJ SOLN
INTRAMUSCULAR | Status: DC | PRN
  Administered 2020-05-05: .1 mg via INTRAVENOUS

## 2020-05-05 MED ORDER — OXYCODONE HCL 5 MG PO TABS: 5.0000 mg | ORAL_TABLET | Freq: Once | ORAL | Status: DC | PRN

## 2020-05-05 SURGICAL SUPPLY — 33 items
BALLN DILATOR 10-12 8 (BALLOONS)
BALLN DILATOR 12-15 8 (BALLOONS)
BALLN DILATOR 15-18 8 (BALLOONS)
BALLN DILATOR CRE 0-12 8 (BALLOONS)
BALLN DILATOR ESOPH 8 10 CRE (MISCELLANEOUS) IMPLANT
BALLOON DILATOR 12-15 8 (BALLOONS) IMPLANT
BALLOON DILATOR 15-18 8 (BALLOONS) IMPLANT
BALLOON DILATOR CRE 0-12 8 (BALLOONS) IMPLANT
BLOCK BITE 60FR ADLT L/F GRN (MISCELLANEOUS) ×2 IMPLANT
CLIP HMST 235XBRD CATH ROT (MISCELLANEOUS) IMPLANT
CLIP RESOLUTION 360 11X235 (MISCELLANEOUS)
ELECT REM PT RETURN 9FT ADLT (ELECTROSURGICAL)
ELECTRODE REM PT RTRN 9FT ADLT (ELECTROSURGICAL) IMPLANT
FCP ESCP3.2XJMB 240X2.8X (MISCELLANEOUS)
FORCEPS BIOP RAD 4 LRG CAP 4 (CUTTING FORCEPS) IMPLANT
FORCEPS BIOP RJ4 240 W/NDL (MISCELLANEOUS)
FORCEPS ESCP3.2XJMB 240X2.8X (MISCELLANEOUS) IMPLANT
GOWN CVR UNV OPN BCK APRN NK (MISCELLANEOUS) ×2 IMPLANT
GOWN ISOL THUMB LOOP REG UNIV (MISCELLANEOUS) ×4
INJECTOR VARIJECT VIN23 (MISCELLANEOUS) IMPLANT
KIT DEFENDO VALVE AND CONN (KITS) IMPLANT
KIT ENDO PROCEDURE OLY (KITS) ×2 IMPLANT
MANIFOLD NEPTUNE II (INSTRUMENTS) ×2 IMPLANT
MARKER SPOT ENDO TATTOO 5ML (MISCELLANEOUS) IMPLANT
RETRIEVER NET PLAT FOOD (MISCELLANEOUS) IMPLANT
SNARE SHORT THROW 13M SML OVAL (MISCELLANEOUS) ×1 IMPLANT
SNARE SHORT THROW 30M LRG OVAL (MISCELLANEOUS) IMPLANT
SPOT EX ENDOSCOPIC TATTOO (MISCELLANEOUS)
SYR INFLATION 60ML (SYRINGE) IMPLANT
TRAP ETRAP POLY (MISCELLANEOUS) IMPLANT
VARIJECT INJECTOR VIN23 (MISCELLANEOUS)
WATER STERILE IRR 250ML POUR (IV SOLUTION) ×2 IMPLANT
WIRE CRE 18-20MM 8CM F G (MISCELLANEOUS) IMPLANT

## 2020-05-05 NOTE — Anesthesia Procedure Notes (Signed)
Date/Time: 05/05/2020 10:51 AM Performed by: Cameron Ali, CRNA Pre-anesthesia Checklist: Patient identified, Emergency Drugs available, Suction available, Timeout performed and Patient being monitored Patient Re-evaluated:Patient Re-evaluated prior to induction Oxygen Delivery Method: Nasal cannula Placement Confirmation: positive ETCO2

## 2020-05-05 NOTE — Interval H&P Note (Signed)
History and Physical Interval Note:  05/05/2020 10:05 AM  Stacey Cline  has presented today for surgery, with the diagnosis of early satiety R68.81.  The various methods of treatment have been discussed with the patient and family. After consideration of risks, benefits and other options for treatment, the patient has consented to  Procedure(s): ESOPHAGOGASTRODUODENOSCOPY (EGD) WITH PROPOFOL (N/A) as a surgical intervention.  The patient's history has been reviewed, patient examined, no change in status, stable for surgery.  I have reviewed the patient's chart and labs.  Questions were answered to the patient's satisfaction.     Daziya Redmond [employer]

## 2020-05-05 NOTE — Anesthesia Preprocedure Evaluation (Signed)
Anesthesia Evaluation  Patient identified by MRN, date of birth, ID band Patient awake    Reviewed: NPO status   History of Anesthesia Complications Negative for: history of anesthetic complications  Airway Mallampati: II  TM Distance: >3 FB Neck ROM: full    Dental  (+) Upper Dentures   Pulmonary neg pulmonary ROS,    Pulmonary exam normal        Cardiovascular Exercise Tolerance: Good Normal cardiovascular exam+ dysrhythmias (PVC's -- frequent with bigeminy)   echo: 2018: - Left ventricle: The cavity size was normal. Systolic function was  normal. The estimated ejection fraction was in the range of 55%  to 60%. Wall motion was normal; there were no regional wall  motion abnormalities. Left ventricular diastolic function  parameters were normal.  - Mitral valve: There was mild regurgitation.  - Left atrium: The atrium was normal in size.  - Right ventricle: Systolic function was normal.  - Pulmonary arteries: Systolic pressure was within the normal  range. ;     Neuro/Psych negative neurological ROS  negative psych ROS   GI/Hepatic negative GI ROS, Neg liver ROS,   Endo/Other  Hypothyroidism Morbid obesity (bmi 31)  Renal/GU negative Renal ROS  negative genitourinary   Musculoskeletal   Abdominal   Peds  Hematology negative hematology ROS (+)   Anesthesia Other Findings Covid: NEG.  Reproductive/Obstetrics                             Anesthesia Physical Anesthesia Plan  ASA: II  Anesthesia Plan:    Post-op Pain Management:    Induction:   PONV Risk Score and Plan: 2 and TIVA and Propofol infusion  Airway Management Planned:   Additional Equipment:   Intra-op Plan:   Post-operative Plan:   Informed Consent: I have reviewed the patients History and Physical, chart, labs and discussed the procedure including the risks, benefits and alternatives for the  proposed anesthesia with the patient or authorized representative who has indicated his/her understanding and acceptance.       Plan Discussed with: CRNA  Anesthesia Plan Comments:         Anesthesia Quick Evaluation

## 2020-05-05 NOTE — Transfer of Care (Signed)
Immediate Anesthesia Transfer of Care Note  Patient: Stacey Cline  Procedure(s) Performed: ESOPHAGOGASTRODUODENOSCOPY (EGD) WITH PROPOFOL (N/A )  Patient Location: PACU  Anesthesia Type: No value filed.  Level of Consciousness: awake, alert  and patient cooperative  Airway and Oxygen Therapy: Patient Spontanous Breathing and Patient connected to supplemental oxygen  Post-op Assessment: Post-op Vital signs reviewed, Patient's Cardiovascular Status Stable, Respiratory Function Stable, Patent Airway and No signs of Nausea or vomiting  Post-op Vital Signs: Reviewed and stable  Complications: No complications documented.

## 2020-05-05 NOTE — Anesthesia Postprocedure Evaluation (Signed)
Anesthesia Post Note  Patient: Stacey Cline  Procedure(s) Performed: ESOPHAGOGASTRODUODENOSCOPY (EGD) WITH PROPOFOL (N/A )     Patient location during evaluation: PACU Anesthesia Type: General Level of consciousness: awake and alert Pain management: pain level controlled Vital Signs Assessment: post-procedure vital signs reviewed and stable Respiratory status: spontaneous breathing, nonlabored ventilation, respiratory function stable and patient connected to nasal cannula oxygen Cardiovascular status: blood pressure returned to baseline and stable Postop Assessment: no apparent nausea or vomiting Anesthetic complications: no   No complications documented.  Fidel Levy

## 2020-05-05 NOTE — Op Note (Signed)
[facility] Gastroenterology Patient Name: Stacey Cline Procedure Date: 05/05/2020 10:47 AM MRN: [id number] Account #: [id number] Date of Birth: 01/05/1956 Admit Type: Outpatient Age: 64 Room: [facility] OR ROOM 01 Gender: Female Note Status: Finalized Procedure:             Upper GI endoscopy Indications:           Early satiety Providers:             Lucilla Lame MD, MD Referring MD:          [name]. Tamala Julian, MD (Referring MD) Medicines:             Propofol per Anesthesia Complications:         No immediate complications. Procedure:             Pre-Anesthesia Assessment:                        - Prior to the procedure, a History and Physical was                         performed, and patient medications and allergies were                         reviewed. The patient's tolerance of previous                         anesthesia was also reviewed. The risks and benefits                         of the procedure and the sedation options and risks                         were discussed with the patient. All questions were                         answered, and informed consent was obtained. Prior                         Anticoagulants: The patient has taken no previous                         anticoagulant or antiplatelet agents. ASA Grade                         Assessment: II - A patient with mild systemic disease.                         After reviewing the risks and benefits, the patient                         was deemed in satisfactory condition to undergo the                         procedure.                        After obtaining informed consent, the endoscope was  passed under direct vision. Throughout the procedure,                         the patient's blood pressure, pulse, and oxygen                         saturations were monitored continuously. The was                         introduced through the mouth, and advanced to the                          second part of duodenum. The upper GI endoscopy was                         accomplished without difficulty. The patient tolerated                         the procedure well. Findings:      LA Grade A (one or more mucosal breaks less than 5 mm, not extending       between tops of 2 mucosal folds) esophagitis with no bleeding was found       at the gastroesophageal junction. Biopsies were taken with a cold       forceps for histology.      The entire examined stomach was normal. Biopsies were taken with a cold       forceps for histology.      The examined duodenum was normal. Impression:            - LA Grade A esophagitis with no bleeding. Biopsied.                        - Normal stomach. Biopsied.                        - Normal examined duodenum. Recommendation:        - Discharge patient to home.                        - Resume previous diet.                        - Continue present medications.                        - Await pathology results. Procedure Code(s):     --- Professional ---                        [id number], Esophagogastroduodenoscopy, flexible,                         transoral; with biopsy, single or multiple Diagnosis Code(s):     --- Professional ---                        R68.81, Early satiety                        K20.90, Esophagitis, unspecified without bleeding CPT copyright 2019 American Medical Association. All rights reserved. The codes documented in  this report are preliminary and upon coder review may  be revised to meet current compliance requirements. Lucilla Lame MD, MD 05/05/2020 10:59:11 AM This report has been signed electronically. Number of Addenda: 0 Note Initiated On: 05/05/2020 10:47 AM Total Procedure Duration: 0 hours 3 minutes 19 seconds  Estimated Blood Loss:  Estimated blood loss: none.      [facility]

## 2020-05-08 ENCOUNTER — Encounter: Payer: Self-pay | Admitting: Gastroenterology

## 2020-05-10 LAB — SURGICAL PATHOLOGY

## 2020-05-15 ENCOUNTER — Encounter: Payer: Self-pay | Admitting: Gastroenterology

## 2020-05-31 ENCOUNTER — Other Ambulatory Visit: Payer: Self-pay | Admitting: Family Medicine

## 2020-05-31 DIAGNOSIS — Z1231 Encounter for screening mammogram for malignant neoplasm of breast: Secondary | ICD-10-CM

## 2020-05-31 DIAGNOSIS — Z780 Asymptomatic menopausal state: Secondary | ICD-10-CM

## 2020-08-31 ENCOUNTER — Ambulatory Visit: Payer: Medicare Other | Admitting: Internal Medicine

## 2020-09-07 ENCOUNTER — Encounter: Payer: Self-pay | Admitting: Internal Medicine

## 2020-09-07 ENCOUNTER — Ambulatory Visit (INDEPENDENT_AMBULATORY_CARE_PROVIDER_SITE_OTHER): Payer: Medicare Other | Admitting: Internal Medicine

## 2020-09-07 ENCOUNTER — Other Ambulatory Visit: Payer: Self-pay

## 2020-09-07 VITALS — BP 140/80 | HR 72 | Ht 66.0 in | Wt 193.1 lb

## 2020-09-07 DIAGNOSIS — I10 Essential (primary) hypertension: Secondary | ICD-10-CM | POA: Diagnosis not present

## 2020-09-07 DIAGNOSIS — R0600 Dyspnea, unspecified: Secondary | ICD-10-CM | POA: Diagnosis not present

## 2020-09-07 DIAGNOSIS — I493 Ventricular premature depolarization: Secondary | ICD-10-CM

## 2020-09-07 DIAGNOSIS — R0609 Other forms of dyspnea: Secondary | ICD-10-CM

## 2020-09-07 NOTE — Progress Notes (Signed)
Patient Care Team: Wardell Honour, MD as PCP - General (Family Medicine)   HPI  Stacey Cline is a 65 y.o. 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 There have a somewhat unusual morphology with nondescript QRS in V1 and a superior axis.   There were frequent couplets and short runs on Holter monitoring. Burden was 41%.  They have persisted Despite calcium blockers. She was started her on flecainide.    Records and Results Reviewed  she saw Dr. Gaylyn Cheers in the interim. She was feeling significantly improved.  Repeat Holter monitor however demonstrated 37% PVCs   QRS morphology demonstrated significant fragmentation   overall feels great.  Some DOE with stairs but otherwise not.  Feels like DOE has improved with modest exercising  No edema no chestpain orthopnea or PND   DATE TEST EF   8/17 Echo   55 %   MR  Mild mod  9/17 MRI  normal     Normal without DE MR moderateHe of course   7/17  CATH   55-60%     Normal Cors   5/18 Echo  55-65%           DATE PR interval QRSduration Dose  6/17 136 86   0   11/17 180 98 100  Flec  6/18 178 88 100  5/19 166 98 100  1/21  148  88  100  7/21  144 86 100  1/22 170 102 100         Date Cr K Hgb  5/21 1.0 4.0  14.5 (9/21)           Past Medical History:  Diagnosis Date  . Dyspnea   . Hypertension   . Leiomyoma of uterus, unspecified   . Menopause age 66  . Papanicolaou smear of cervix with atypical squamous cells of undetermined significance (ASC-US)   . Postsurgical hypothyroidism   . Pure hypercholesterolemia   . PVCs (premature ventricular contractions)   . Unspecified vitamin D deficiency   . Ventral hernia, unspecified, without mention of obstruction or gangrene   . Wears dentures    partial upper    Past Surgical History:  Procedure Laterality Date  . BREAST BIOPSY  2006   Right  Benign  . CARDIAC CATHETERIZATION N/A 03/06/2016   Procedure: Right/Left Heart Cath  and Coronary Angiography;  Surgeon: Wellington Hampshire, MD;  Location: [place] [facility];  Service: Cardiovascular;  Laterality: N/A;  . COLONOSCOPY WITH PROPOFOL N/A 04/14/2017   Procedure: COLONOSCOPY WITH PROPOFOL;  Surgeon: Lucilla Lame, MD;  Location: [place];  Service: Endoscopy;  Laterality: N/A;  . COSMETIC SURGERY     feet  . ESOPHAGOGASTRODUODENOSCOPY (EGD) WITH PROPOFOL N/A 05/05/2020   Procedure: ESOPHAGOGASTRODUODENOSCOPY (EGD) WITH PROPOFOL;  Surgeon: Lucilla Lame, MD;  Location: [place];  Service: Endoscopy;  Laterality: N/A;  . TONSILLECTOMY AND ADENOIDECTOMY  1960's  . TOTAL THYROIDECTOMY  2008    Current Outpatient Medications  Medication Sig Dispense Refill  . flecainide (TAMBOCOR) 100 MG tablet TAKE 1 TABLET BY MOUTH TWICE A DAY 180 tablet 3  . levothyroxine (SYNTHROID, LEVOTHROID) 75 MCG tablet Take 1 tablet (75 mcg) by mouth once daily on Saturday and Sunday    . levothyroxine (SYNTHROID, LEVOTHROID) 88 MCG tablet Take 1 tablet (88 mcg) by mouth once daily Monday through Friday    . rosuvastatin (CRESTOR) 10 MG tablet Take 10 mg by mouth daily.  No current facility-administered medications for this visit.    Allergies  Allergen Reactions  . Fish Allergy Hives and Itching  . Garlic Swelling    angioedema  . Isovue [Iopamidol] Hives    Pt developed 1 hive on her chest and 2 on her neck area appx 10 minutes post contrast injection. Given 50 mg oral benadryl.  Needs 13 hour pre meds in the future.   Marland Kitchen Penicillins Rash    Has patient had a PCN reaction causing immediate rash, facial/tongue/throat swelling, SOB or lightheadedness with hypotension: Yes Has patient had a PCN reaction causing severe rash involving mucus membranes or skin necrosis: No Has patient had a PCN reaction that required hospitalization No Has patient had a PCN reaction occurring within the last 10 years: Yes If all of the above answers are "NO", then may proceed with  Cephalosporin use.   . Shellfish Allergy Hives and Rash      Review of Systems negative except from HPI and PMH  Physical Exam BP 140/80 (BP Location: Left Arm, Patient Position: Sitting, Cuff Size: Large)   Pulse 72   Ht 5\' 6"  (1.676 m)   Wt 193 lb 2 oz (87.6 kg)   SpO2 98%   BMI 31.17 kg/m   Well developed and nourished in no acute distress HENT normal Neck supple with JVP-  flat  Clear Regular rate and rhythm, no murmurs or gallops Abd-soft with active BS No Clubbing cyanosis edema Skin-warm and dry A & Oriented  Grossly normal sensory and motor function  ECG sinus 72 17/10/42   Assessment and  Plan  PVCs-unusual morphology superior axis and a largely nondescript QRS morphology in lead V1- fragmented   Dyspnea on exertion  Hypertension  PVCs largely quiescient.  We will continue her on her flecainide.  QRS/PR intervals are lengthening somewhat.  We may want to decrease the dose at her next visit.  Blood pressure little bit on the high side.  Dyspnea has improved with some exercise.  Have encouraged her to continue to exercise.

## 2020-09-07 NOTE — Patient Instructions (Signed)

## 2020-09-21 ENCOUNTER — Ambulatory Visit: Payer: Medicare Other | Admitting: Internal Medicine

## 2020-10-19 ENCOUNTER — Telehealth: Payer: Self-pay | Admitting: Internal Medicine

## 2020-10-19 DIAGNOSIS — I493 Ventricular premature depolarization: Secondary | ICD-10-CM

## 2020-10-19 NOTE — Telephone Encounter (Signed)
Let us quantitate the PVC burden >> ZIO 5 d

## 2020-10-19 NOTE — Telephone Encounter (Signed)
I spoke with the patient. She states that ~ 3 weeks ago she started to notice labored breathing prior to going on a cruise ~ 2 weeks ago. She had also started exercising ~ 3 weeks ago and she has been working on various projects at [employer].  She advised when she was on the cruise she felt fine and had no issues, but since being back so had noticed feelings of labored breathing and an unusual feeling from just under her throat to the tops of her breast, but not chest "tightness" or pain.   She denies dizziness or lightheadedness.  Her concern in calling today is these are symptoms she was feeling prior to seeing Dr. Caryl Comes initially. I inquired if she could fee her PVC's and she advised she can't feel them. In order to tell if she is having these, she has to be very quiet & still and count/ "hear" her pulse. She advised she was having PVC's, about every other beat 1 day this week.  She also advised that once she returned from her cruise, she has been doing projects in her church, but not exercise. However, there have been ~ 6-8 deaths of close church members in the last month.  I have advised her that with her symptoms starting prior to her cruise, stopping while on the cruise, and resuming once she returned home, there may be some component of stress/ anxiety that is contributing to how she is feeling. I advised her I do not feel there is anything urgent that is occurring at the present time, but will review with Dr. Caryl Comes for his thoughts and call her back.   The patient voices understanding and is agreeable.   She has had a negative COVID test in the interim.

## 2020-10-19 NOTE — Telephone Encounter (Signed)
I spoke with the patient.  She is aware I spoke with Dr. Caryl Comes and he is unsure if her symptoms are stress/ anxiety related vs her PVC's. He would like her to wear a ZIO XT x 5 days to quantitate her her PVC burden.  The patient voices understanding and is agreeable.  I have offered to mail this to her, but she would prefer to come in clinic tomorrow to have this placed since that will be a little bit quicker.   She is going to come tomorrow at 1:30 pm.   Order placed.

## 2020-10-19 NOTE — Telephone Encounter (Signed)
Pt c/o Shortness Of Breath: STAT if SOB developed within the last 24 hours or pt is noticeably SOB on the phone  1. Are you currently SOB (can you hear that pt is SOB on the phone)?  No   2. How long have you been experiencing SOB? 2 weeks   3. Are you SOB when sitting or when up moving around? With mild exertion improves with rest   4. Are you currently experiencing any other symptoms? Anxiety , chest tightness located underneath throat area but denies issues around breast to abdomen   Patient wants emergency appt with Dr. Caryl Comes to evaluate as she was having these issues when she established with Dr. Caryl Comes

## 2020-10-20 ENCOUNTER — Other Ambulatory Visit: Payer: Self-pay

## 2020-10-20 ENCOUNTER — Ambulatory Visit (INDEPENDENT_AMBULATORY_CARE_PROVIDER_SITE_OTHER): Payer: Medicare Other

## 2020-10-20 DIAGNOSIS — I493 Ventricular premature depolarization: Secondary | ICD-10-CM | POA: Diagnosis not present

## 2020-10-25 ENCOUNTER — Telehealth: Payer: Self-pay | Admitting: Internal Medicine

## 2020-10-25 NOTE — Telephone Encounter (Signed)
I spoke with the patient. She advised she read the ZIO booklet for her monitor and answered her own question.  She had no further questions at this time and is going to mail the monitor back as she has completed the time frame for wear.

## 2020-10-25 NOTE — Telephone Encounter (Signed)
Patient calling  Patient has questions regarding what to do with monitor before mailing  Please call to discuss

## 2020-11-03 ENCOUNTER — Telehealth: Payer: Self-pay | Admitting: Internal Medicine

## 2020-11-03 MED ORDER — FLECAINIDE ACETATE 100 MG PO TABS
100.0000 mg | ORAL_TABLET | Freq: Two times a day (BID) | ORAL | 3 refills | Status: DC
Start: 2020-11-03 — End: 2021-09-11

## 2020-11-03 NOTE — Telephone Encounter (Signed)
Patient calling to check the status of ZIO monitor results Please call

## 2020-11-03 NOTE — Telephone Encounter (Signed)
Requested Prescriptions   Signed Prescriptions Disp Refills   flecainide (TAMBOCOR) 100 MG tablet 180 tablet 3    Sig: Take 1 tablet (100 mg total) by mouth 2 (two) times daily.    Authorizing Provider: Deboraha Sprang    Ordering User: Britt Bottom

## 2020-11-03 NOTE — Telephone Encounter (Signed)
Dr. Caryl Comes,  Please review ZIO and advise- see 10/19/20 phone note.  Thanks!

## 2020-11-03 NOTE — Telephone Encounter (Signed)
*  STAT* If patient is at the pharmacy, call can be transferred to refill team.   1. Which medications need to be refilled? (please list name of each medication and dose if known) flecainide 100 MG 1 tablet 2 times daily   2. Which pharmacy/location (including street and city if local pharmacy) is medication to be sent to? [facility] on [address]   3. Do they need a 30 day or 90 day supply? 90 day   Patient needs ASAP

## 2020-11-07 NOTE — Telephone Encounter (Signed)
Deboraha Sprang, MD [phone] 11/07/2020 Routine   Narrative & Impression  Patch Wear Time:  4 days and 22 hours (2022-03-04T13:37:53-0500 to 2022-03-09T11:38:50-0500)  Patient had a min HR of 50 bpm, max HR of 111 bpm, and avg HR of 68 bpm. Predominant underlying rhythm was Sinus Rhythm. 1 run of Supraventricular Tachycardia occurred lasting 4 beats with a max rate of 109 bpm (avg 105 bpm). Isolated SVEs were rare  (<1.0%), and no SVE Couplets or SVE Triplets were present. Isolated VEs were rare (<1.0%), VE Couplets were rare (<1.0%), and no VE Triplets were present. Ventricular Bigeminy was present.   Recommendations    Infrequent ectopy    Not likely contributing to symptoms

## 2020-11-07 NOTE — Telephone Encounter (Signed)
I called and spoke with the patient regarding his heart monitor results. I have advised her per Dr. Caryl Comes, he did not feel like her symptoms (SOB) as initially reported were cardiac in nature.  Per the patient, she is still having symptoms of not being able to walk across the room without becoming SOB. I have advised her that she may want to follow up with her PCP at this point and let them weigh in and we can certainly see her back if needed.  The patient voices understanding and is agreeable.

## 2021-03-17 IMAGING — US US ABDOMEN COMPLETE
1 series · 14 of 25 positions shown · non-contrast
Comparison: Ultrasound 05/06/2013

CLINICAL DATA: Abdomen pain with early satiety

EXAM:
ABDOMEN ULTRASOUND COMPLETE

[Series 1: us abdomen complete · 0.18mm/px · 14 of 66 slices shown]
[im 1/66]
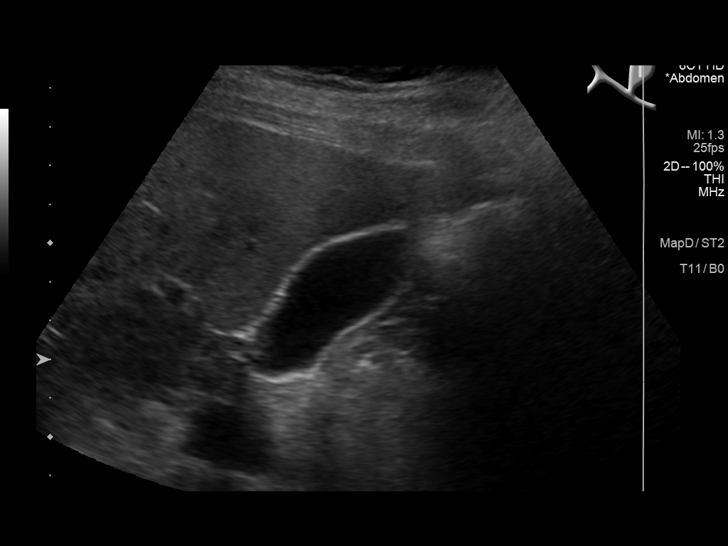
[im 6/66]
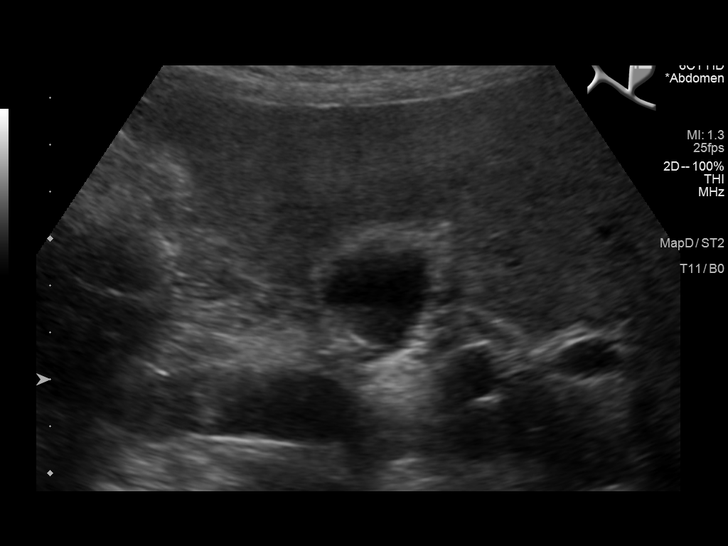
[im 11/66]
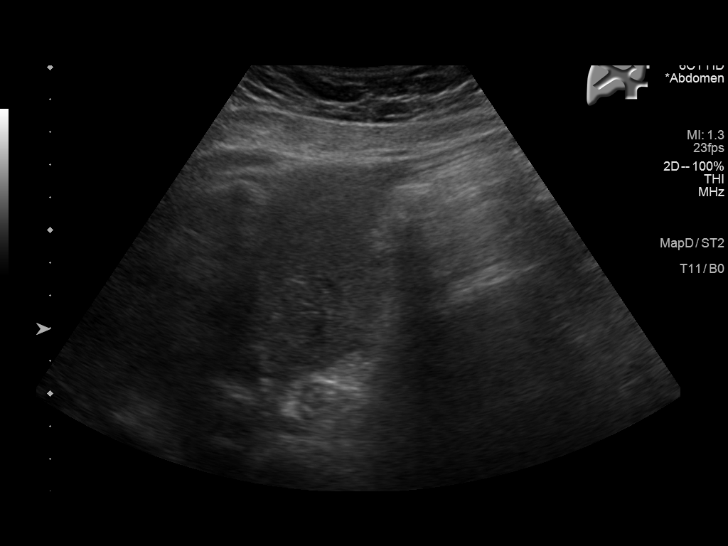
[im 17/66]
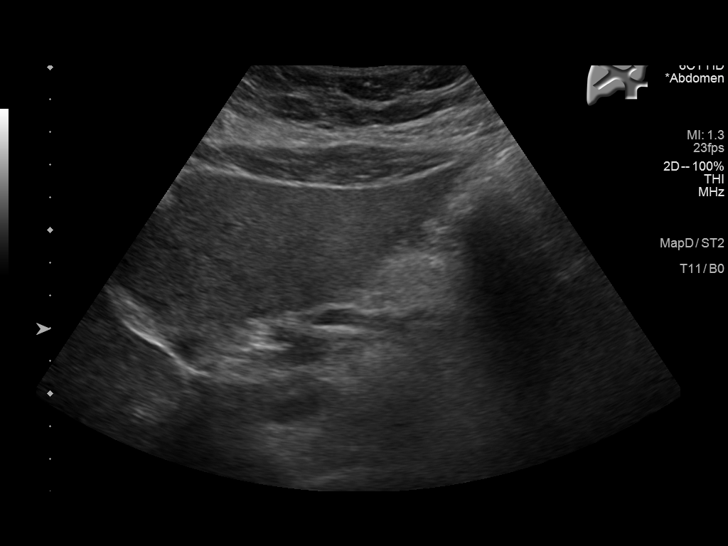
[im 22/66]
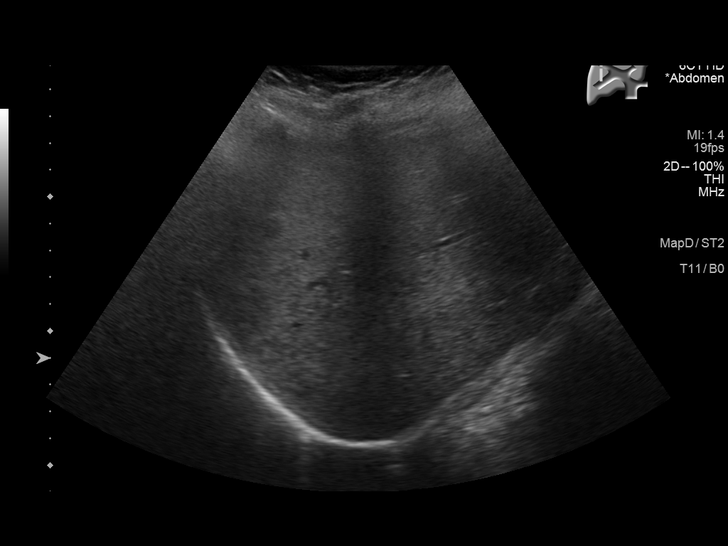
[im 25/66]
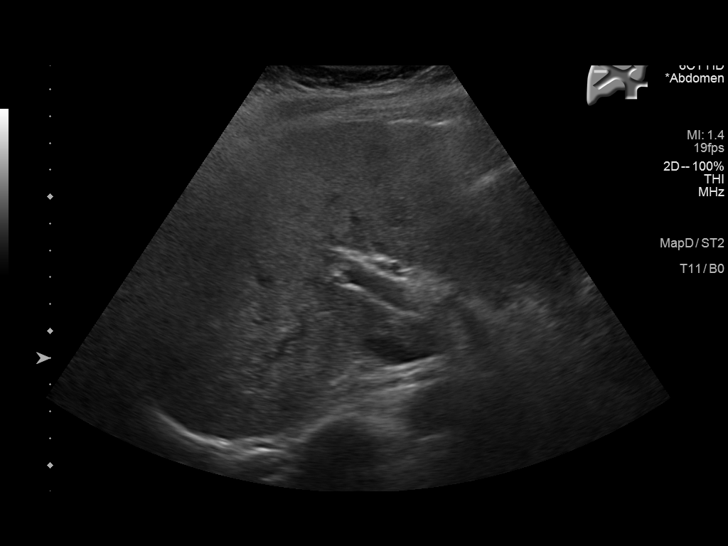
[im 30/66]
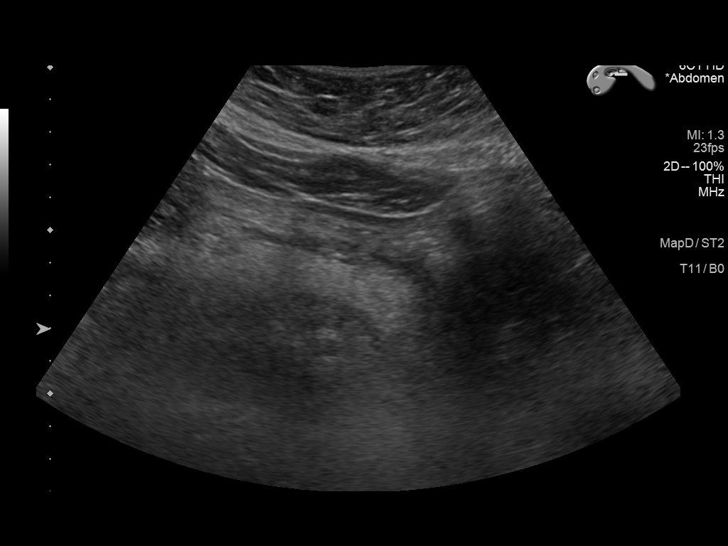
[im 36/66]
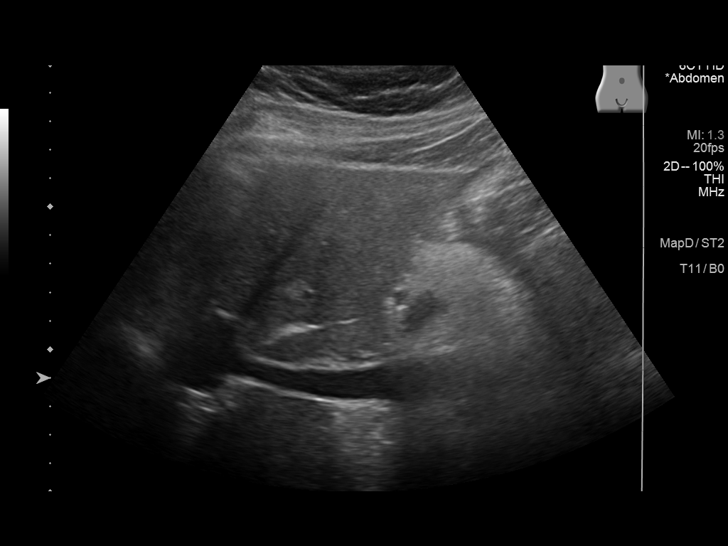
[im 41/66]
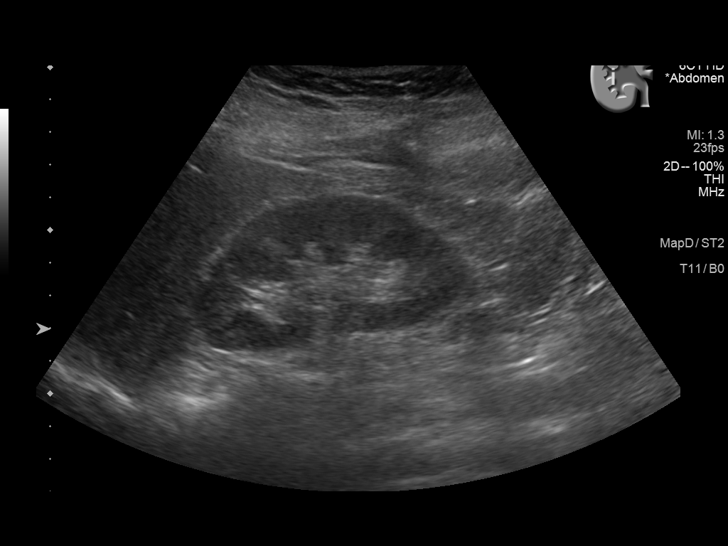
[im 44/66]
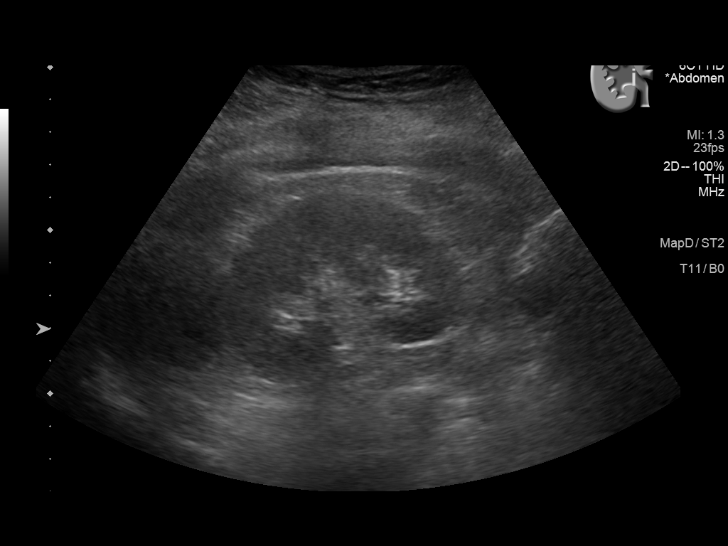
[im 49/66]
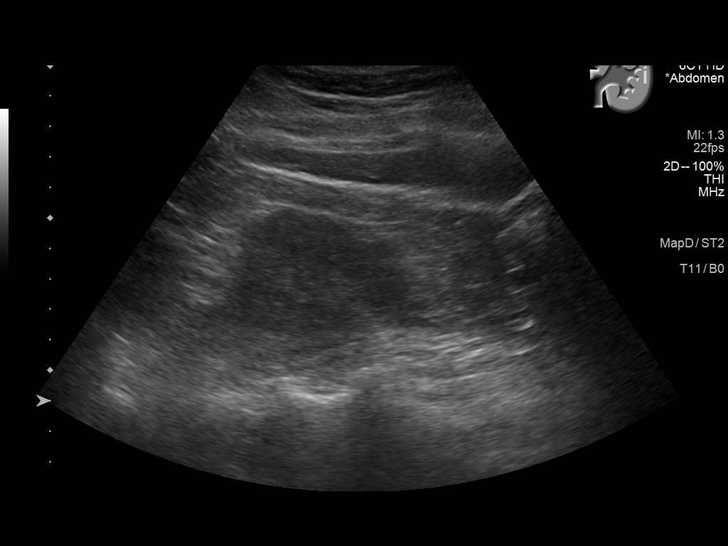
[im 55/66]
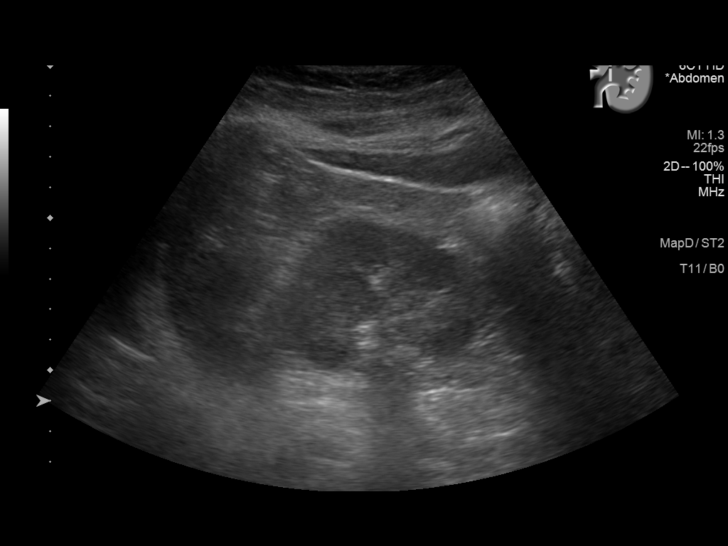
[im 60/66]
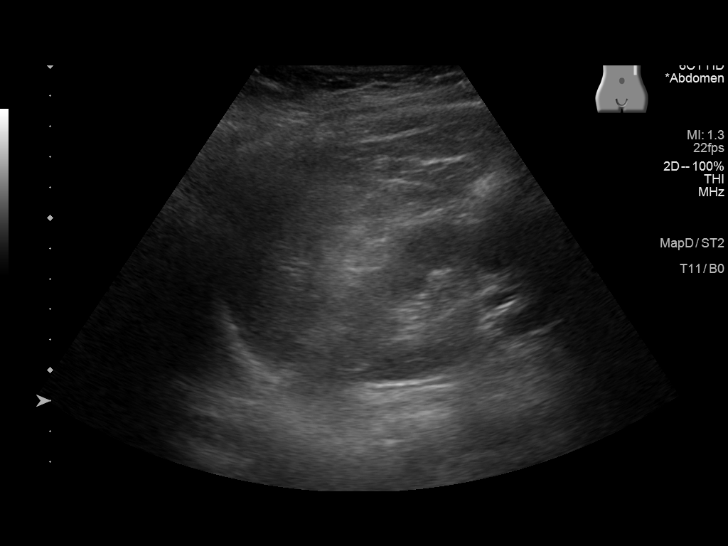
[im 66/66]
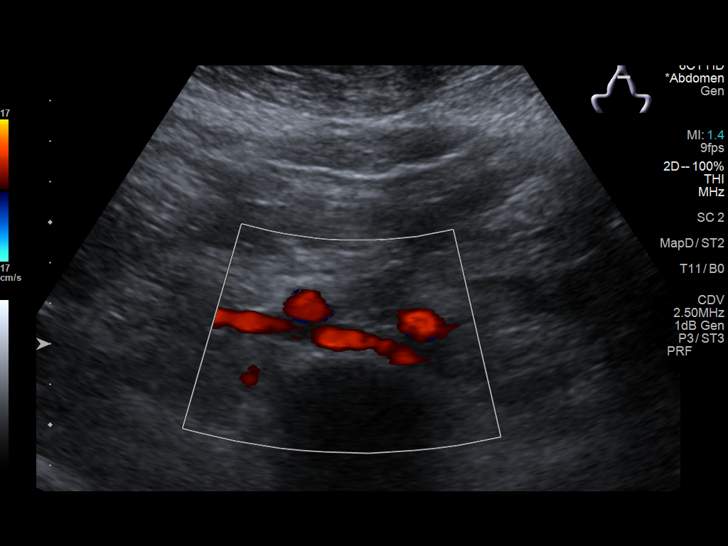

[14 of 25 positions shown; findings below may reference images not displayed]

FINDINGS: Gallbladder: No gallstones or wall thickening visualized. No
sonographic Murphy sign noted by sonographer.

Common bile duct: Diameter: 4.1 mm

Liver: Increased echogenicity. No focal hepatic abnormality. Portal
vein is patent on color Doppler imaging with normal direction of
blood flow towards the liver.

IVC: No abnormality visualized.

Pancreas: Visualized portion unremarkable.

Spleen: Size and appearance within normal limits.

Right Kidney: Length: 8.9 cm. Echogenicity within normal limits. No
mass or hydronephrosis visualized.

Left Kidney: Length: 8.5 cm. Echogenicity within normal limits. No
mass or hydronephrosis visualized.

Abdominal aorta: No aneurysm visualized.

Other findings: None.
IMPRESSION: Negative examination

## 2021-09-11 ENCOUNTER — Other Ambulatory Visit: Payer: Self-pay

## 2021-09-11 ENCOUNTER — Ambulatory Visit (INDEPENDENT_AMBULATORY_CARE_PROVIDER_SITE_OTHER): Payer: Medicare Other | Admitting: Internal Medicine

## 2021-09-11 VITALS — BP 128/82 | HR 64 | Ht 66.0 in | Wt 196.0 lb

## 2021-09-11 DIAGNOSIS — I493 Ventricular premature depolarization: Secondary | ICD-10-CM | POA: Diagnosis not present

## 2021-09-11 DIAGNOSIS — I10 Essential (primary) hypertension: Secondary | ICD-10-CM | POA: Diagnosis not present

## 2021-09-11 DIAGNOSIS — R0609 Other forms of dyspnea: Secondary | ICD-10-CM

## 2021-09-11 MED ORDER — FLECAINIDE ACETATE 150 MG PO TABS: 75.0000 mg | ORAL_TABLET | Freq: Two times a day (BID) | ORAL | 2 refills | Status: DC

## 2021-09-11 NOTE — Progress Notes (Signed)
Patient Care Team: Wardell Honour, MD as PCP - General (Family Medicine)   HPI  Stacey Cline is a 66 y.o. 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 There have a somewhat unusual morphology with nondescript QRS in V1 and a superior axis.   There were frequent couplets and short runs on Holter monitoring. Burden was 41%.  They have persisted Despite calcium blockers. She was started her on flecainide.    Records and Results Reviewed  she saw Dr. Gaylyn Cheers in the interim. She was feeling significantly improved.  Repeat Holter monitor however demonstrated 37% PVCs   QRS morphology demonstrated significant fragmentation  Date PVCs  9/17 32%  3/22 <1%    The patient denies chest pain  nocturnal dyspnea, orthopnea or peripheral edema.  There have been no palpitations, lightheadedness or syncope.  Some shortness of breath, chronic, DATE TEST EF   8/17 Echo   55 %   MR  Mild mod  9/17 MRI  normal     Normal without DE MR moderateHe of course   7/17  CATH   55-60%     Normal Cors   5/18 Echo  55-65%           DATE PR interval QRSduration Dose  6/17 136 86   0   11/17 180 98 100  Flec  6/18 178 88 100  5/19 166 98 100  1/21  148  88  100  7/21  144 86 100  1/22 170 102 100  1/23 164 108 100    Date Cr K Hgb  5/21 1.0 4.0  14.5 (9/21)   10/22 1.0 4.3 14.6     Past Medical History:  Diagnosis Date   Dyspnea    Hypertension    Leiomyoma of uterus, unspecified    Menopause age 18   Papanicolaou smear of cervix with atypical squamous cells of undetermined significance (ASC-US)    Postsurgical hypothyroidism    Pure hypercholesterolemia    PVCs (premature ventricular contractions)    Unspecified vitamin D deficiency    Ventral hernia, unspecified, without mention of obstruction or gangrene    Wears dentures    partial upper    Past Surgical History:  Procedure Laterality Date   BREAST BIOPSY  2006   Right  Benign    CARDIAC CATHETERIZATION N/A 03/06/2016   Procedure: Right/Left Heart Cath and Coronary Angiography;  Surgeon: Wellington Hampshire, MD;  Location: [facility];  Service: Cardiovascular;  Laterality: N/A;   COLONOSCOPY WITH PROPOFOL N/A 04/14/2017   Procedure: COLONOSCOPY WITH PROPOFOL;  Surgeon: Lucilla Lame, MD;  Location: [facility];  Service: Endoscopy;  Laterality: N/A;   COSMETIC SURGERY     feet   ESOPHAGOGASTRODUODENOSCOPY (EGD) WITH PROPOFOL N/A 05/05/2020   Procedure: ESOPHAGOGASTRODUODENOSCOPY (EGD) WITH PROPOFOL;  Surgeon: Lucilla Lame, MD;  Location: [facility];  Service: Endoscopy;  Laterality: N/A;   TONSILLECTOMY AND ADENOIDECTOMY  1960's   TOTAL THYROIDECTOMY  2008    Current Outpatient Medications  Medication Sig Dispense Refill   flecainide (TAMBOCOR) 150 MG tablet Take 0.5 tablets (75 mg total) by mouth 2 (two) times daily. 90 tablet 2   hydrochlorothiazide (HYDRODIURIL) 12.5 MG tablet Take 12.5 mg by mouth daily.     levothyroxine (SYNTHROID, LEVOTHROID) 75 MCG tablet Take 1 tablet (75 mcg) by mouth once daily on Saturday and Sunday     levothyroxine (SYNTHROID, LEVOTHROID) 88 MCG tablet Take  1 tablet (88 mcg) by mouth once daily Monday through Friday     rosuvastatin (CRESTOR) 10 MG tablet Take 10 mg by mouth daily.     No current facility-administered medications for this visit.    Allergies  Allergen Reactions   Fish Allergy Hives and Itching   Garlic Swelling    angioedema   Isovue [Iopamidol] Hives    Pt developed 1 hive on her chest and 2 on her neck area appx 10 minutes post contrast injection. Given 50 mg oral benadryl.  Needs 13 hour pre meds in the future.    Penicillins Rash    Has patient had a PCN reaction causing immediate rash, facial/tongue/throat swelling, SOB or lightheadedness with hypotension: Yes Has patient had a PCN reaction causing severe rash involving mucus membranes or skin necrosis: No Has patient had a PCN reaction  that required hospitalization No Has patient had a PCN reaction occurring within the last 10 years: Yes If all of the above answers are "NO", then may proceed with Cephalosporin use.    Shellfish Allergy Hives and Rash      Review of Systems negative except from HPI and PMH  Physical Exam BP 128/82 (BP Location: Left Arm, Patient Position: Sitting, Cuff Size: Large)    Pulse 64    Ht 5\' 6"  (1.676 m)    Wt 196 lb (88.9 kg)    SpO2 96%    BMI 31.64 kg/m  Well developed and nourished in no acute distress HENT normal Neck supple with JVP-  flat   Clear Regular rate and rhythm, no murmurs or gallops Abd-soft with active BS No Clubbing cyanosis edema Skin-warm and dry A & Oriented  Grossly normal sensory and motor function  ECG sinus rhythm at 64 Intervals 16/11/44 Nonspecific T wave changes No PVCs   Assessment and  Plan  PVCs-unusual morphology superior axis and a largely nondescript QRS morphology in lead V1- fragmented    Dyspnea on exertion  Hypertension  PVCs remain clinically quiescient.  We will continue her on flecainide but we will try down titrating, and we will begin her on 75 twice daily.  We will plan to reassess her burden in about 6 months with a Zio patch and possibly decrease to 50 twice daily.

## 2021-09-11 NOTE — Patient Instructions (Signed)
Medication Instructions:  - Your physician has recommended you make the following change in your medication:   1) CHANGE Flecainide to 150 mg: - take 0.5 tablet (75 mg) by mouth TWICE daily  *If you need a refill on your cardiac medications before your next appointment, please call your pharmacy*   Lab Work: - none ordered  If you have labs (blood work) drawn today and your tests are completely normal, you will receive your results only by: [place] (if you have MyChart) OR A paper copy in the mail If you have any lab test that is abnormal or we need to change your treatment, we will call you to review the results.   Testing/Procedures: 1) Heart Monitor:  To be placed: in 6 months (this will be mailed to your home address) Length of Wear: 3 days   - Your physician has recommended that you wear a Zio XT (heart) monitor.   This monitor is a medical device that records the hearts electrical activity. Doctors most often use these monitors to diagnose arrhythmias. Arrhythmias are problems with the speed or rhythm of the heartbeat. The monitor is a small device applied to your chest. You can wear one while you do your normal daily activities. While wearing this monitor if you have any symptoms to push the button and record what you felt. Once you have worn this monitor for the period of time provider prescribed (Usually 14 days), you will return the monitor device in the postage paid box. Once it is returned they will download the data collected and provide [place] with a report which the provider will then review and we will call you with those results. Important tips:  Avoid showering during the first 24 hours of wearing the monitor. Avoid excessive sweating to help maximize wear time. Do not submerge the device, no hot tubs, and no swimming pools. Keep any lotions or oils away from the patch. After 24 hours you may shower with the patch on. Take brief showers with your back facing  the shower head.  Do not remove patch once it has been placed because that will interrupt data and decrease adhesive wear time. Push the button when you have any symptoms and write down what you were feeling. Once you have completed wearing your monitor, remove and place into box which has postage paid and place in your outgoing mailbox.  If for some reason you have misplaced your box then call our office and we can provide another box and/or mail it off for you.      Follow-Up: At [facility], you and your health needs are our priority.  As part of our continuing mission to provide you with exceptional heart care, we have created designated Provider Care Teams.  These Care Teams include your primary Cardiologist (physician) and Advanced Practice Providers (APPs -  Physician Assistants and Nurse Practitioners) who all work together to provide you with the care you need, when you need it.  We recommend signing up for the patient portal called "MyChart".  Sign up information is provided on this After Visit Summary.  MyChart is used to connect with patients for Virtual Visits (Telemedicine).  Patients are able to view lab/test results, encounter notes, upcoming appointments, etc.  Non-urgent messages can be sent to your provider as well.   To learn more about what you can do with MyChart, go to [email or web address].    Your next appointment:   6 month(s)  The format for your next  appointment:   In Person  Provider:   Virl Axe, MD    Other Instructions N/a

## 2021-09-13 ENCOUNTER — Ambulatory Visit: Payer: Medicare Other | Admitting: Internal Medicine

## 2021-09-17 ENCOUNTER — Telehealth: Payer: Self-pay | Admitting: Internal Medicine

## 2021-09-17 NOTE — Telephone Encounter (Signed)
Pt c/o medication issue:  1. Name of Medication: Flecainide   2. How are you currently taking this medication (dosage and times per day)? 75 mg po BID  3. Are you having a reaction (difficulty breathing--STAT)? No   4. What is your medication issue? Patient stating decrease has caused palpitations / flutters and sob when going up stairs   Patient has been taking for a week and states this isnt working . Patient would like to discuss if she needs to give it more time or change back.     Patient notes she would like to discuss all of this with klein or an APP.

## 2021-09-17 NOTE — Telephone Encounter (Signed)
I spoke with the patient. She was seen in the office on 09/11/21 with Dr. Caryl Comes. She states that this morning, she has noticed an increase in her heart palpitations that feel significantly more to her. She does not think the flecainide 75 mg BID lower dosing that was recommended last week is going to work.  I inquired if she felt ok over the weekend. Per the patient, she felt ok, but was busy so may not have noticed symptoms.  I have advised that I will follow up with Dr. Caryl Comes regarding the above and hope to give her a call back tomorrow with further recommendations.  The patient voices understanding and is agreeable.

## 2021-09-20 MED ORDER — FLECAINIDE ACETATE 100 MG PO TABS: 100.0000 mg | ORAL_TABLET | Freq: Two times a day (BID) | ORAL | 3 refills | Status: DC

## 2021-09-20 NOTE — Telephone Encounter (Signed)
I called and spoke with the patient. I have advised her of Dr. Olin Pia recommendations to go ahead and resume her flecainide at 100 mg BID. Per the patient, she has continued to have symptoms every day on the flecainide 75 mg BID dosing.   She inquired if she took the 75 mg dose of flecainide this morning, should she continue with that dose tonight and start the 100 mg BID tomorrow.  I advised the patient she may take 100 mg of flecainide tonight when her dose is due and then start 100 mg BID tomorrow.   The patient voices understanding and is agreeable.

## 2021-09-20 NOTE — Telephone Encounter (Signed)
We can resume her prior dose of flecainide

## 2021-11-22 ENCOUNTER — Other Ambulatory Visit: Payer: Self-pay | Admitting: Internal Medicine

## 2021-11-23 ENCOUNTER — Telehealth: Payer: Self-pay | Admitting: Internal Medicine

## 2021-11-23 MED ORDER — FLECAINIDE ACETATE 100 MG PO TABS: 100.0000 mg | ORAL_TABLET | Freq: Two times a day (BID) | ORAL | 0 refills | Status: DC

## 2021-11-23 NOTE — Telephone Encounter (Signed)
. ?  Requested Prescriptions  ? ?Signed Prescriptions Disp Refills  ? flecainide (TAMBOCOR) 100 MG tablet 180 tablet 0  ?  Sig: Take 1 tablet (100 mg total) by mouth 2 (two) times daily.  ?  Authorizing Provider: Deboraha Sprang  ?  Ordering User: Othelia Pulling C  ? ? ?

## 2021-11-23 NOTE — Telephone Encounter (Signed)
?*  STAT* If patient is at the pharmacy, call can be transferred to refill team. ? ? ?1. Which medications need to be refilled? (please list name of each medication and dose if known)  ?flecainide (TAMBOCOR) 100 MG tablet ? ? ?2. Which pharmacy/location (including street and city if local pharmacy) is medication to be sent to? ?[facility] [place] [facility] [place] [facility] [place] ? ? ?3. Do they need a 30 day or 90 day supply? 90 ds ? ?

## 2022-03-01 ENCOUNTER — Other Ambulatory Visit: Payer: Self-pay | Admitting: *Deleted

## 2022-03-01 ENCOUNTER — Ambulatory Visit (INDEPENDENT_AMBULATORY_CARE_PROVIDER_SITE_OTHER): Payer: Medicare Other

## 2022-03-01 DIAGNOSIS — I493 Ventricular premature depolarization: Secondary | ICD-10-CM

## 2022-03-04 DIAGNOSIS — I493 Ventricular premature depolarization: Secondary | ICD-10-CM | POA: Diagnosis not present

## 2022-04-04 ENCOUNTER — Encounter: Payer: Self-pay | Admitting: Internal Medicine

## 2022-04-04 ENCOUNTER — Ambulatory Visit (INDEPENDENT_AMBULATORY_CARE_PROVIDER_SITE_OTHER): Payer: Medicare Other | Admitting: Internal Medicine

## 2022-04-04 VITALS — BP 130/78 | HR 64 | Ht 66.5 in | Wt 191.4 lb

## 2022-04-04 DIAGNOSIS — I493 Ventricular premature depolarization: Secondary | ICD-10-CM

## 2022-04-04 DIAGNOSIS — I10 Essential (primary) hypertension: Secondary | ICD-10-CM

## 2022-04-04 DIAGNOSIS — R0609 Other forms of dyspnea: Secondary | ICD-10-CM

## 2022-04-04 NOTE — Progress Notes (Signed)
Patient Care Team: Wardell Honour, MD as PCP - General (Family Medicine)   HPI  Stacey Cline is a 66 y.o. 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 There have a somewhat unusual morphology with nondescript QRS in V1 and a superior axis.   There were frequent couplets and short runs on Holter monitoring. Burden was 41%.  They have persisted Despite calcium blockers. She was started on flecainide.       Date PVCs  9/17 32%  3/22 <1%  7/23 1.2%     DATE TEST EF   8/17 Echo   55 %   MR  Mild mod  9/17 MRI  normal     Normal without DE MR moderateHe of course   7/17  CATH   55-60%     Normal Cors   5/18 Echo  55-65%           DATE PR interval QRSduration Dose  6/17 136 86   0   11/17 180 98 100  Flec  6/18 178 88 100  5/19 166 98 100  1/21  148  88  100  7/21  144 86 100  1/22 170 102 100  1/23 164 108 100         Date Cr K Hgb  5/21 1.0 4.0  14.5 (9/21)   10/22 1.0 4.3 14.6          Past Medical History:  Diagnosis Date   Dyspnea    Hypertension    Leiomyoma of uterus, unspecified    Menopause age 44   Papanicolaou smear of cervix with atypical squamous cells of undetermined significance (ASC-US)    Postsurgical hypothyroidism    Pure hypercholesterolemia    PVCs (premature ventricular contractions)    Unspecified vitamin D deficiency    Ventral hernia, unspecified, without mention of obstruction or gangrene    Wears dentures    partial upper    Past Surgical History:  Procedure Laterality Date   BREAST BIOPSY  2006   Right  Benign   CARDIAC CATHETERIZATION N/A 03/06/2016   Procedure: Right/Left Heart Cath and Coronary Angiography;  Surgeon: Wellington Hampshire, MD;  Location: [facility];  Service: Cardiovascular;  Laterality: N/A;   COLONOSCOPY WITH PROPOFOL N/A 04/14/2017   Procedure: COLONOSCOPY WITH PROPOFOL;  Surgeon: Lucilla Lame, MD;  Location: [facility];  Service:  Endoscopy;  Laterality: N/A;   COSMETIC SURGERY     feet   ESOPHAGOGASTRODUODENOSCOPY (EGD) WITH PROPOFOL N/A 05/05/2020   Procedure: ESOPHAGOGASTRODUODENOSCOPY (EGD) WITH PROPOFOL;  Surgeon: Lucilla Lame, MD;  Location: [facility];  Service: Endoscopy;  Laterality: N/A;   TONSILLECTOMY AND ADENOIDECTOMY  1960's   TOTAL THYROIDECTOMY  2008    Current Outpatient Medications  Medication Sig Dispense Refill   flecainide (TAMBOCOR) 100 MG tablet Take 1 tablet (100 mg total) by mouth 2 (two) times daily. 180 tablet 0   levothyroxine (SYNTHROID, LEVOTHROID) 75 MCG tablet Take 1 tablet (75 mcg) by mouth once daily on Saturday and Sunday     levothyroxine (SYNTHROID, LEVOTHROID) 88 MCG tablet Take 1 tablet (88 mcg) by mouth once daily Monday through Friday     rosuvastatin (CRESTOR) 10 MG tablet Take 10 mg by mouth daily.     hydrochlorothiazide (HYDRODIURIL) 12.5 MG tablet Take 12.5 mg by mouth daily. (Patient not taking: Reported on 04/04/2022)     No current facility-administered medications for this visit.  Allergies  Allergen Reactions   Fish Allergy Hives and Itching   Garlic Swelling    angioedema   Isovue [Iopamidol] Hives    Pt developed 1 hive on her chest and 2 on her neck area appx 10 minutes post contrast injection. Given 50 mg oral benadryl.  Needs 13 hour pre meds in the future.    Penicillins Rash    Has patient had a PCN reaction causing immediate rash, facial/tongue/throat swelling, SOB or lightheadedness with hypotension: Yes Has patient had a PCN reaction causing severe rash involving mucus membranes or skin necrosis: No Has patient had a PCN reaction that required hospitalization No Has patient had a PCN reaction occurring within the last 10 years: Yes If all of the above answers are "NO", then may proceed with Cephalosporin use.    Shellfish Allergy Hives and Rash      Review of Systems negative except from HPI and PMH  Physical Exam BP 130/78 (BP  Location: Left Arm, Patient Position: Sitting, Cuff Size: Normal)   Pulse 64   Ht 5' 6.5" (1.689 m)   Wt 191 lb 6 oz (86.8 kg)   SpO2 96%   BMI 30.43 kg/m  Well developed and nourished in no acute distress HENT normal Neck supple with JVP-  flat   Clear Regular rate and rhythm, no murmurs or gallops Abd-soft with active BS No Clubbing cyanosis edema Skin-warm and dry A & Oriented  Grossly normal sensory and motor function  ECG     Assessment and  Plan  PVCs-unusual morphology superior axis and a largely nondescript QRS morphology in lead V1- fragmented    Dyspnea on exertion  Hypertension  Obesity  PVCs are quiet at 1.2% and overall feels terrific Continue at 100 mg bid;  ECG was reviewed but not available at this time of note completing But parameters were stable   BP stable  currently off HCTZ and dypsnea still an issue-- long discussion re the importance of exercise in this young lady She said.Marland KitchenMarland Kitchen

## 2022-04-04 NOTE — Patient Instructions (Signed)
Medication Instructions:  - Your physician recommends that you continue on your current medications as directed. Please refer to the Current Medication list given to you today.  *If you need a refill on your cardiac medications before your next appointment, please call your pharmacy*   Lab Work: - none ordered  If you have labs (blood work) drawn today and your tests are completely normal, you will receive your results only by: MyChart Message (if you have MyChart) OR A paper copy in the mail If you have any lab test that is abnormal or we need to change your treatment, we will call you to review the results.   Testing/Procedures: - none ordered   Follow-Up: At [facility], you and your health needs are our priority.  As part of our continuing mission to provide you with exceptional heart care, we have created designated Provider Care Teams.  These Care Teams include your primary Cardiologist (physician) and Advanced Practice Providers (APPs -  Physician Assistants and Nurse Practitioners) who all work together to provide you with the care you need, when you need it.  We recommend signing up for the patient portal called "MyChart".  Sign up information is provided on this After Visit Summary.  MyChart is used to connect with patients for Virtual Visits (Telemedicine).  Patients are able to view lab/test results, encounter notes, upcoming appointments, etc.  Non-urgent messages can be sent to your provider as well.   To learn more about what you can do with MyChart, go to [email or web address].    Your next appointment:   1 year(s)  The format for your next appointment:   In Person  Provider:   Steven Klein, MD    Other Instructions N/a  Important Information About Sugar       

## 2022-05-30 ENCOUNTER — Ambulatory Visit: Payer: Medicare Other | Admitting: Internal Medicine

## 2023-02-18 ENCOUNTER — Other Ambulatory Visit: Payer: Self-pay | Admitting: Internal Medicine

## 2023-02-18 ENCOUNTER — Telehealth: Payer: Self-pay | Admitting: Internal Medicine

## 2023-02-18 MED ORDER — FLECAINIDE ACETATE 100 MG PO TABS: 100.0000 mg | ORAL_TABLET | Freq: Two times a day (BID) | ORAL | 0 refills | Status: DC

## 2023-02-18 NOTE — Telephone Encounter (Signed)
*  STAT* If patient is at the pharmacy, call can be transferred to refill team.   1. Which medications need to be refilled? (please list name of each medication and dose if known) flecainide (TAMBOCOR) 100 MG tablet   2. Which pharmacy/location (including street and city if local pharmacy) is medication to be sent to?[facility] - [place] [place] - [address]   3. Do they need a 30 day or 90 day supply? 90 day

## 2023-02-18 NOTE — Telephone Encounter (Signed)
Requested Prescriptions   Signed Prescriptions Disp Refills   flecainide (TAMBOCOR) 100 MG tablet 180 tablet 0    Sig: Take 1 tablet (100 mg total) by mouth 2 (two) times daily.    Authorizing Provider: KLEIN, STEVEN C    Ordering User: NEWCOMER MCCLAIN, Quantez Schnyder L    

## 2023-04-24 ENCOUNTER — Ambulatory Visit: Payer: Medicare Other | Attending: Internal Medicine | Admitting: Internal Medicine

## 2023-04-24 ENCOUNTER — Encounter: Payer: Self-pay | Admitting: Internal Medicine

## 2023-04-24 VITALS — BP 148/88 | HR 65 | Ht 66.5 in | Wt 184.8 lb

## 2023-04-24 DIAGNOSIS — I493 Ventricular premature depolarization: Secondary | ICD-10-CM | POA: Diagnosis present

## 2023-04-24 DIAGNOSIS — R072 Precordial pain: Secondary | ICD-10-CM | POA: Diagnosis present

## 2023-04-24 DIAGNOSIS — R079 Chest pain, unspecified: Secondary | ICD-10-CM | POA: Insufficient documentation

## 2023-04-24 LAB — BASIC METABOLIC PANEL
BUN/Creatinine Ratio: 10 — ABNORMAL LOW (ref 12–28)
BUN: 12 mg/dL (ref 8–27)
CO2: 24 mmol/L (ref 20–29)
Calcium: 9.7 mg/dL (ref 8.7–10.3)
Chloride: 106 mmol/L (ref 96–106)
Creatinine, Ser: 1.25 mg/dL — ABNORMAL HIGH (ref 0.57–1.00)
Glucose: 92 mg/dL (ref 70–99)
Potassium: 4.5 mmol/L (ref 3.5–5.2)
Sodium: 144 mmol/L (ref 134–144)
eGFR: 47 mL/min/{1.73_m2} — ABNORMAL LOW (ref >59)

## 2023-04-24 MED ORDER — METOPROLOL TARTRATE 100 MG PO TABS: ORAL_TABLET | ORAL | 0 refills | Status: AC

## 2023-04-24 NOTE — Patient Instructions (Addendum)
Medication Instructions:  Take Metoprolol 100 mg 2 hours before CT when scheduled.   *If you need a refill on your cardiac medications before your next appointment, please call your pharmacy*   Lab Work: BMET today    If you have labs (blood work) drawn today and your tests are completely normal, you will receive your results only by: MyChart Message (if you have MyChart) OR A paper copy in the mail If you have any lab test that is abnormal or we need to change your treatment, we will call you to review the results.   Testing/Procedures: Coronary CTA- they will call you to get this scheduled.   Follow-Up: At [facility], you and your health needs are our priority.  As part of our continuing mission to provide you with exceptional heart care, we have created designated Provider Care Teams.  These Care Teams include your primary Cardiologist (physician) and Advanced Practice Providers (APPs -  Physician Assistants and Nurse Practitioners) who all work together to provide you with the care you need, when you need it.  We recommend signing up for the patient portal called "MyChart".  Sign up information is provided on this After Visit Summary.  MyChart is used to connect with patients for Virtual Visits (Telemedicine).  Patients are able to view lab/test results, encounter notes, upcoming appointments, etc.  Non-urgent messages can be sent to your provider as well.   To learn more about what you can do with MyChart, go to [email or web address].    Your next appointment:   12 month(s)  Provider:   Sherryl Manges, MD    Other Instructions   Your cardiac CT will be scheduled at one of the below locations:   [facility] [address] [place], [place] [address] [phone]  OR  [facility] [address] [place], [place] [address] [phone]  OR   [facility] [address] [place], [place] [address] [phone]  If scheduled at [facility], please arrive at the [facility] and Children's Entrance (Entrance C2) of [facility] 30 minutes prior to test start time. You can use the FREE valet parking offered at entrance C (encouraged to control the heart rate for the test)  Proceed to the [facility] Radiology Department (first floor) to check-in and test prep.  All radiology patients and guests should use entrance C2 at [facility], accessed from [facility], even though the hospital's physical address listed is [address].    If scheduled at [facility] or [facility], please arrive 15 mins early for check-in and test prep.  There is spacious parking and easy access to the radiology department from the [facility] Heart and Vascular entrance. Please enter here and check-in with the desk attendant.   Please follow these instructions carefully (unless otherwise directed):  An IV will be required for this test and Nitroglycerin will be given.  Hold all erectile dysfunction medications at least 3 days (72 hrs) prior to test. (Ie viagra, cialis, sildenafil, tadalafil, etc)   On the Night Before the Test: Be sure to Drink plenty of water. Do not consume any caffeinated/decaffeinated beverages or chocolate 12 hours prior to your test. Do not take any antihistamines 12 hours prior to your test.  On the Day of the Test: Drink plenty of water until 1 hour prior to the test. Do not eat any food 1  hour prior to test. You may take your regular medications prior to the test.  Take metoprolol (Lopressor) two hours prior to test. If you take Furosemide/Hydrochlorothiazide/Spironolactone, please HOLD on the morning of the test. FEMALES- please wear underwire-free bra if available, avoid dresses & tight clothing  After the Test: Drink plenty of water. After receiving IV contrast, you  may experience a mild flushed feeling. This is normal. On occasion, you may experience a mild rash up to 24 hours after the test. This is not dangerous. If this occurs, you can take Benadryl 25 mg and increase your fluid intake. If you experience trouble breathing, this can be serious. If it is severe call 911 IMMEDIATELY. If it is mild, please call our office. If you take any of these medications: Glipizide/Metformin, Avandament, Glucavance, please do not take 48 hours after completing test unless otherwise instructed.  We will call to schedule your test 2-4 weeks out understanding that some insurance companies will need an authorization prior to the service being performed.   For more information and frequently asked questions, please visit our website : [email or web address]  For non-scheduling related questions, please contact the cardiac imaging nurse navigator should you have any questions/concerns: Cardiac Imaging Nurse Navigators Direct Office Dial: [phone]   For scheduling needs, including cancellations and rescheduling, please call [place], [phone].

## 2023-04-24 NOTE — Progress Notes (Signed)
Patient Care Team: Ethelda Chick, MD as PCP - General (Family Medicine)   HPI  Stacey Cline is a 67 y.o. 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 There have a somewhat unusual morphology with nondescript QRS in V1 and a superior axis.   There were frequent couplets and short runs on Holter monitoring. Burden was 41%.  They have persisted Despite calcium blockers. She was started on flecainide w terrific response  Doing very well.  Scant palpitations.  No shortness of breath peripheral edema or nocturnal dyspnea.  Has had 2 episodes of discomfort in her back associated with exertion but thought that they were related to muscle pulling.  1 persisted for a few days and got better with a heating pad.  The other was associated with exertion and moving suitcases and then would come and go.       Date PVCs  9/17 32%  3/22 <1%  7/23 1.2%     DATE TEST EF   8/17 Echo   55 %   MR  Mild mod  9/17 MRI  normal     Normal without DE MR moderateHe of course   7/17  CATH   55-60%     Normal Cors   5/18 Echo  55-65%   6/19 CT  Aortic atherosclerosis     DATE PR interval QRSduration Dose  6/17 136 86   0   11/17 180 98 100  Flec  6/18 178 88 100  5/19 166 98 100  1/21  148  88  100  7/21  144 86 100  1/22 170 102 100  1/23 164 108 100  9/24 164 100 100    Date Cr K Hgb  5/21 1.0 4.0  14.5 (9/21)   10/22 1.0 4.3 14.6  4/24 1.1 4.2 14.1     Past Medical History:  Diagnosis Date   Dyspnea    Hypertension    Leiomyoma of uterus, unspecified    Menopause age 18   Papanicolaou smear of cervix with atypical squamous cells of undetermined significance (ASC-US)    Postsurgical hypothyroidism    Pure hypercholesterolemia    PVCs (premature ventricular contractions)    Unspecified vitamin D deficiency    Ventral hernia, unspecified, without mention of obstruction or gangrene    Wears dentures    partial upper    Past  Surgical History:  Procedure Laterality Date   BREAST BIOPSY  2006   Right  Benign   CARDIAC CATHETERIZATION N/A 03/06/2016   Procedure: Right/Left Heart Cath and Coronary Angiography;  Surgeon: Iran Ouch, MD;  Location: [facility] INVASIVE CV [facility];  Service: Cardiovascular;  Laterality: N/A;   COLONOSCOPY WITH PROPOFOL N/A 04/14/2017   Procedure: COLONOSCOPY WITH PROPOFOL;  Surgeon: Midge Minium, MD;  Location: [facility];  Service: Endoscopy;  Laterality: N/A;   COSMETIC SURGERY     feet   ESOPHAGOGASTRODUODENOSCOPY (EGD) WITH PROPOFOL N/A 05/05/2020   Procedure: ESOPHAGOGASTRODUODENOSCOPY (EGD) WITH PROPOFOL;  Surgeon: Midge Minium, MD;  Location: [facility];  Service: Endoscopy;  Laterality: N/A;   TONSILLECTOMY AND ADENOIDECTOMY  1960's   TOTAL THYROIDECTOMY  2008    Current Outpatient Medications  Medication Sig Dispense Refill   flecainide (TAMBOCOR) 100 MG tablet Take 1 tablet (100 mg total) by mouth 2 (two) times daily. 180 tablet 0   levothyroxine (SYNTHROID, LEVOTHROID) 75 MCG tablet Take 1 tablet (75 mcg) by mouth once daily on  Wednesday and Sunday     levothyroxine (SYNTHROID, LEVOTHROID) 88 MCG tablet Take 1 tablet (88 mcg) by mouth every other day     rosuvastatin (CRESTOR) 10 MG tablet Take 10 mg by mouth daily.     No current facility-administered medications for this visit.    Allergies  Allergen Reactions   Fish Allergy Hives and Itching   Garlic Swelling    angioedema   Isovue [Iopamidol] Hives    Pt developed 1 hive on her chest and 2 on her neck area appx 10 minutes post contrast injection. Given 50 mg oral benadryl.  Needs 13 hour pre meds in the future.    Penicillins Rash    Has patient had a PCN reaction causing immediate rash, facial/tongue/throat swelling, SOB or lightheadedness with hypotension: Yes Has patient had a PCN reaction causing severe rash involving mucus membranes or skin necrosis: No Has patient had a PCN reaction that  required hospitalization No Has patient had a PCN reaction occurring within the last 10 years: Yes If all of the above answers are "NO", then may proceed with Cephalosporin use.    Shellfish Allergy Hives and Rash      Review of Systems negative except from HPI and PMH  Physical Exam BP (!) 140/78   Pulse 65   Ht 5' 6.5" (1.689 m)   Wt 184 lb 12.8 oz (83.8 kg)   SpO2 95%   BMI 29.38 kg/m  Well developed and nourished in no acute distress HENT normal Neck supple with JVP-  flat   Clear Regular rate and rhythm, no murmurs or gallops Abd-soft with active BS No Clubbing cyanosis edema Skin-warm and dry A & Oriented  Grossly normal sensory and motor function  ECG sinus at 59 Intervals 16/10/44  Assessment and  Plan  PVCs-unusual morphology superior axis and a largely nondescript QRS morphology in lead V1- fragmented    Chest discomfort  Hypertension  Obesity  PVCs remain quiet.  Will continue on flecainide 100 twice daily; did not tolerate previously down titration.  Ongoing use of flecainide makes the chest discomfort issue more pressing.  Will undertake cardiac CTA . Blood pressure on the high side.  Also on repeat but she has been checking it at home and has been in the 1 25-1 30 range.  Will continue to monitor

## 2023-04-25 ENCOUNTER — Encounter (HOSPITAL_COMMUNITY): Payer: Self-pay

## 2023-04-25 ENCOUNTER — Other Ambulatory Visit (HOSPITAL_COMMUNITY): Payer: Self-pay | Admitting: *Deleted

## 2023-04-25 ENCOUNTER — Other Ambulatory Visit: Payer: Self-pay

## 2023-04-25 DIAGNOSIS — I10 Essential (primary) hypertension: Secondary | ICD-10-CM

## 2023-04-25 DIAGNOSIS — R072 Precordial pain: Secondary | ICD-10-CM

## 2023-04-25 MED ORDER — PREDNISONE 50 MG PO TABS: ORAL_TABLET | ORAL | 0 refills | Status: DC

## 2023-05-02 ENCOUNTER — Telehealth (HOSPITAL_COMMUNITY): Payer: Self-pay | Admitting: *Deleted

## 2023-05-02 NOTE — Telephone Encounter (Signed)
Calling patient to see if she had any questions regarding her 13 hour prep. Patient states she read her mychart instructions and has the medications.  She will re-read on Saturday/Sunday to ensure she follows the instructions.  Larey Brick RN Navigator Cardiac Imaging [facility] Heart and Vascular Services [phone] Office [phone] Cell

## 2023-05-05 ENCOUNTER — Ambulatory Visit
Admission: RE | Admit: 2023-05-05 | Discharge: 2023-05-05 | Disposition: A | Discharge status: Home / Self Care | Payer: Medicare Other | Source: Ambulatory Visit | Attending: Internal Medicine | Admitting: Internal Medicine

## 2023-05-05 DIAGNOSIS — R072 Precordial pain: Secondary | ICD-10-CM | POA: Insufficient documentation

## 2023-05-05 DIAGNOSIS — I493 Ventricular premature depolarization: Secondary | ICD-10-CM | POA: Insufficient documentation

## 2023-05-05 DIAGNOSIS — R079 Chest pain, unspecified: Secondary | ICD-10-CM | POA: Insufficient documentation

## 2023-05-05 MED ORDER — NITROGLYCERIN 0.4 MG SL SUBL
0.8000 mg | SUBLINGUAL_TABLET | Freq: Once | SUBLINGUAL | Status: AC
  Administered 2023-05-05: 0.8 mg via SUBLINGUAL
  Filled 2023-05-05: qty 25

## 2023-05-05 MED ORDER — IOHEXOL 350 MG/ML SOLN
80.0000 mL | Freq: Once | INTRAVENOUS | Status: AC | PRN
  Administered 2023-05-05: 80 mL via INTRAVENOUS

## 2023-05-05 NOTE — Progress Notes (Signed)
,  Pt tolerated procedure well with no issues. Pt ABCs intact. Pt denies any complaints. Pt encouraged to drink plenty of water throughout the day. Pt ambulatory with steady gait.

## 2023-05-15 ENCOUNTER — Other Ambulatory Visit: Payer: Self-pay | Admitting: Internal Medicine

## 2024-02-25 ENCOUNTER — Other Ambulatory Visit: Payer: Self-pay

## 2024-02-25 ENCOUNTER — Emergency Department
Admission: EM | Admit: 2024-02-25 | Discharge: 2024-02-26 | Disposition: A | Discharge status: Home / Self Care | Attending: Emergency Medicine | Admitting: Emergency Medicine

## 2024-02-25 ENCOUNTER — Emergency Department

## 2024-02-25 DIAGNOSIS — R200 Anesthesia of skin: Secondary | ICD-10-CM | POA: Diagnosis present

## 2024-02-25 DIAGNOSIS — R202 Paresthesia of skin: Secondary | ICD-10-CM | POA: Diagnosis not present

## 2024-02-25 LAB — CBC WITH DIFFERENTIAL/PLATELET
Abs Immature Granulocytes: 0.01 K/uL (ref 0.00–0.07)
Basophils Absolute: 0.1 K/uL (ref 0.0–0.1)
Basophils Relative: 1 %
Eosinophils Absolute: 0.1 K/uL (ref 0.0–0.5)
Eosinophils Relative: 1 %
HCT: 44.4 % (ref 36.0–46.0)
Hemoglobin: 14.7 g/dL (ref 12.0–15.0)
Immature Granulocytes: 0 %
Lymphocytes Relative: 40 %
Lymphs Abs: 2.5 K/uL (ref 0.7–4.0)
MCH: 28.2 pg (ref 26.0–34.0)
MCHC: 33.1 g/dL (ref 30.0–36.0)
MCV: 85.2 fL (ref 80.0–100.0)
Monocytes Absolute: 0.4 K/uL (ref 0.1–1.0)
Monocytes Relative: 7 %
Neutro Abs: 3.2 K/uL (ref 1.7–7.7)
Neutrophils Relative %: 51 %
Platelets: 247 K/uL (ref 150–400)
RBC: 5.21 MIL/uL — ABNORMAL HIGH (ref 3.87–5.11)
RDW: 15 % (ref 11.5–15.5)
WBC: 6.2 K/uL (ref 4.0–10.5)
nRBC: 0 % (ref 0.0–0.2)

## 2024-02-25 LAB — COMPREHENSIVE METABOLIC PANEL WITH GFR
ALT: 20 U/L (ref 0–44)
AST: 20 U/L (ref 15–41)
Albumin: 4 g/dL (ref 3.5–5.0)
Alkaline Phosphatase: 60 U/L (ref 38–126)
Anion gap: 8 (ref 5–15)
BUN: 16 mg/dL (ref 8–23)
CO2: 25 mmol/L (ref 22–32)
Calcium: 9.5 mg/dL (ref 8.9–10.3)
Chloride: 108 mmol/L (ref 98–111)
Creatinine, Ser: 0.89 mg/dL (ref 0.44–1.00)
GFR, Estimated: >60 mL/min (ref >60)
Glucose, Bld: 108 mg/dL — ABNORMAL HIGH (ref 70–99)
Potassium: 4.3 mmol/L (ref 3.5–5.1)
Sodium: 141 mmol/L (ref 135–145)
Total Bilirubin: 0.7 mg/dL (ref 0.0–1.2)
Total Protein: 7.7 g/dL (ref 6.5–8.1)

## 2024-02-25 MED ORDER — PREDNISONE 20 MG PO TABS
60.0000 mg | ORAL_TABLET | Freq: Once | ORAL | Status: AC
  Administered 2024-02-25: 60 mg via ORAL
  Filled 2024-02-25: qty 3

## 2024-02-25 MED ORDER — PREDNISONE 10 MG (21) PO TBPK: ORAL_TABLET | ORAL | 0 refills | Status: DC

## 2024-02-25 NOTE — ED Provider Notes (Signed)
 [facility] Provider Note    Event Date/Time   First MD Initiated Contact with Patient 02/25/24 1950     (approximate)   History   Numbness   HPI {Remember to add pertinent medical, surgical, social, and/or OB history to HPI:1} Stacey Cline is a 68 y.o. female  ***       Physical Exam   Triage Vital Signs: ED Triage Vitals  Encounter Vitals Group     BP 02/25/24 1528 (!) 173/97     Girls Systolic BP Percentile --      Girls Diastolic BP Percentile --      Boys Systolic BP Percentile --      Boys Diastolic BP Percentile --      Pulse Rate 02/25/24 1528 73     Resp 02/25/24 1528 18     Temp 02/25/24 1528 98.4 F (36.9 C)     Temp Source 02/25/24 1528 Oral     SpO2 02/25/24 1528 98 %     Weight 02/25/24 1528 190 lb (86.2 kg)     Height 02/25/24 1528 5' 6 (1.676 m)     Head Circumference --      Peak Flow --      Pain Score 02/25/24 1529 0     Pain Loc --      Pain Education --      Exclude from Growth Chart --     Most recent vital signs: Vitals:   02/25/24 1949 02/25/24 1950  BP: (!) 185/86   Pulse:  66  Resp:    Temp:    SpO2:  99%    {Only need to document appropriate and relevant physical exam:1} General: Awake, no distress. *** CV:  Good peripheral perfusion. *** Resp:  Normal effort. *** Abd:  No distention. *** Other:  ***   ED Results / Procedures / Treatments   Labs (all labs ordered are listed, but only abnormal results are displayed) Labs Reviewed  COMPREHENSIVE METABOLIC PANEL WITH GFR - Abnormal; Notable for the following components:      Result Value   Glucose, Bld 108 (*)    All other components within normal limits  CBC WITH DIFFERENTIAL/PLATELET - Abnormal; Notable for the following components:   RBC 5.21 (*)    All other components within normal limits     EKG  ***   RADIOLOGY *** {USE THE WORD INTERPRETED!! You MUST document your own interpretation of imaging, as well as the fact  that you reviewed the radiologist's report!:1}   PROCEDURES:  Critical Care performed: Yes  CRITICAL CARE Performed by: Guadalupe Eagles   Total critical care time: *** minutes  Critical care time was exclusive of separately billable procedures and treating other patients.  Critical care was necessary to treat or prevent imminent or life-threatening deterioration.  Critical care was time spent personally by me on the following activities: development of treatment plan with patient and/or surrogate as well as nursing, discussions with consultants, evaluation of patient's response to treatment, examination of patient, obtaining history from patient or surrogate, ordering and performing treatments and interventions, ordering and review of laboratory studies, ordering and review of radiographic studies, pulse oximetry and re-evaluation of patient's condition.   Procedures    MEDICATIONS ORDERED IN ED: Medications - No data to display   IMPRESSION / MDM / ASSESSMENT AND PLAN / ED COURSE  I reviewed the triage vital signs and the nursing notes.  Differential diagnosis includes, but is not limited to, ***  Patient's presentation is most consistent with {EM COPA:27473}   ***The patient is on the cardiac monitor to evaluate for evidence of arrhythmia and/or significant heart rate changes.  ***      FINAL CLINICAL IMPRESSION(S) / ED DIAGNOSES   Final diagnoses:  None        Rx / DC Orders   ED Discharge Orders     None        Note:  This document was prepared using Dragon voice recognition software and may include unintentional dictation errors.

## 2024-02-25 NOTE — ED Notes (Signed)
 Rounded on pt and placed fall bundle

## 2024-02-25 NOTE — ED Triage Notes (Signed)
 Patient states numbness from her waist down since Saturday; was seen at a hospital in [facility] for the same on Monday. Patient ambulating with no difficulty.

## 2024-02-25 NOTE — Discharge Instructions (Signed)
 Please be sure to follow up with your doctor. 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 If you notice the numbness going any higher, or if you start having any associated weakness or difficulty with ambulation please return to the emergency department.

## 2024-05-26 ENCOUNTER — Telehealth: Payer: Self-pay | Admitting: Cardiology

## 2024-05-26 MED ORDER — FLECAINIDE ACETATE 100 MG PO TABS: 100.0000 mg | ORAL_TABLET | Freq: Two times a day (BID) | ORAL | 0 refills | Status: DC

## 2024-05-26 NOTE — Telephone Encounter (Signed)
*  STAT* If patient is at the pharmacy, call can be transferred to refill team.   1. Which medications need to be refilled? (please list name of each medication and dose if known)   flecainide  (TAMBOCOR ) 100 MG tablet     2. Would you like to learn more about the convenience, safety, & potential cost savings by using the [facility]? no   3. Are you open to using the [facility] (Type [facility].  ).no   4. Which pharmacy/location (including street and city if local pharmacy) is medication to be sent to? [id number] - [place], [place] - [address]     5. Do they need a 30 day or 90 day supply? 90 day   Pt only has five days left

## 2024-05-26 NOTE — Telephone Encounter (Signed)
 Pt's medication was sent to pt's pharmacy as requested. Confirmation received.

## 2024-06-08 ENCOUNTER — Encounter: Payer: Self-pay | Admitting: Cardiology

## 2024-06-08 ENCOUNTER — Ambulatory Visit: Attending: Cardiology | Admitting: Cardiology

## 2024-06-08 VITALS — BP 144/76 | HR 64 | Ht 66.0 in | Wt 193.6 lb

## 2024-06-08 DIAGNOSIS — I10 Essential (primary) hypertension: Secondary | ICD-10-CM | POA: Diagnosis not present

## 2024-06-08 DIAGNOSIS — E7800 Pure hypercholesterolemia, unspecified: Secondary | ICD-10-CM | POA: Diagnosis not present

## 2024-06-08 DIAGNOSIS — E6609 Other obesity due to excess calories: Secondary | ICD-10-CM

## 2024-06-08 DIAGNOSIS — Z6831 Body mass index (BMI) 31.0-31.9, adult: Secondary | ICD-10-CM

## 2024-06-08 DIAGNOSIS — I2510 Atherosclerotic heart disease of native coronary artery without angina pectoris: Secondary | ICD-10-CM | POA: Diagnosis not present

## 2024-06-08 DIAGNOSIS — I493 Ventricular premature depolarization: Secondary | ICD-10-CM | POA: Diagnosis not present

## 2024-06-08 DIAGNOSIS — E66811 Obesity, class 1: Secondary | ICD-10-CM

## 2024-06-08 NOTE — Progress Notes (Unsigned)
 Cardiology Office Note   Date:  06/10/2024  ID:  Stacey, Cline 04-Mar-1956, MRN [id number] PCP: Claudene Rayfield HERO, MD  [facility] Providers Cardiologist:  Deatrice Cage, MD Cardiology APP:  Gerard Frederick, NP     History of Present Illness Stacey Cline is a 68 y.o. female with past medical history of hyperlipidemia, hypothyroidism, tobacco abuse, symptomatic PVCs, hypertension, who is here today for follow-up.   Echocardiogram in 03/2016 revealed an LVEF 55% with mild to moderate MR.  Cardiac MRI in 04/2016 found a normal EF moderate MR. 02/2016 catheterization showed EF 55 to 60% with normal coronaries.  Echocardiogram 12/2016 revealed LVEF 55 to 65%.  6/19 CT showed aortic atherosclerosis.  9/17 Holter monitor revealed 32% PVCs.  Repeat in 10/2020 after being off flecainide  revealed less than 1%.  Additionally on 7/23 showed 1.2%.  She has been maintained on 100 grams flecainide  twice daily since 06/2016 with beneficial results stable PR interval and QRS.  Coronary CTA completed in September 2024 with coronary calcium  score of 34.3 which is 60th percentile for age and sex matched control.  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  Consider preventative therapy and risk factor modification.   She was last seen in clinic 04/24/2023 by Dr. Fernande.  At that time she was doing well with limited amount of PVCs.  She been continued on flecainide  100 mg twice daily with triplicate spots.  She was doing very well with scant palpitations.  She denies shortness of breath peripheral edema or nocturnal dyspnea.  She had 2 episodes of discomfort in her back associated with exertion thought to be related to muscle aches.  She was scheduled for coronary CTA to rule out any ischemic causes of her chest discomfort which she thought was musculoskeletal.  No other medication changes were made and she was continued on flecainide  100 mg twice daily.  She return to clinic stating that overall from the cardiac  perspective she was doing well.  Denied any chest pain, shortness of breath, dyspnea on exertion.  She stated she did have numbness and issues with nerves.  She had been evaluated by neurology as she had started a new medication and there was a concern that she may have an episode of a Guillian-Barre syndrome.  She had stated that the numbness and tingling and the paresthesias started in her toes and started running and stopped about waist high and then slowly restented.  She was started on steroids and antibiotics.  Her MRI showed nerves were splintering.  She still continues to suffer from numbness and tingling in her feet and hurts while walking today.  Symptoms did not start until she was prescribed Macrobid on 1 July as she had symptoms suggestive of a urinary tract infection.  She was advised she may need to add that to her allergy list so that she has never given that medication again if that exacerbated these symptoms.  She had been encouraged to continue to follow with neurology until her symptoms have resolved or the causative agent is found.  ROS: 10 point review of system has been reviewed and considered negative the exception was been listed in the HPI  Studies Reviewed EKG Interpretation Date/Time:  Tuesday June 08 2024 14:47:01 EDT Ventricular Rate:  64 PR Interval:  154 QRS Duration:  104 QT Interval:  422 QTC Calculation: 435 R Axis:   32  Text Interpretation: Normal sinus rhythm Nonspecific T wave abnormality When compared with ECG of 24-Apr-2023 10:54, No significant change was  found Confirmed by Gerard Frederick ([id number]) on 06/08/2024 2:57:59 PM    Cardiac Studies & Procedures   ______________________________________________________________________________________________ CARDIAC CATHETERIZATION  CARDIAC CATHETERIZATION 03/06/2016  Conclusion  The left ventricular systolic function is normal.  1. Near normal coronary arteries with only minimal irregularities affecting the  left anterior descending artery. 2. Normal LV systolic function with an ejection fraction of 55-60%. 3. Right heart catheterization showed high normal pulmonary pressure, high normal filling pressure with a wedge pressure of 14 and normal cardiac output.  Recommendations: There is no coronary artery disease to explain the patient's symptoms. Her symptoms might be related to excessive PVCs. Fortunately, her LV systolic function is still normal. Proceed with Holter monitor as planned. I added small dose Toprol .  Findings Coronary Findings Diagnostic  Dominance: Right  Left Main The vessel exhibits minimal luminal irregularities.  Left Anterior Descending The vessel exhibits minimal luminal irregularities.  First Diagonal Branch The vessel is angiographically normal.  Second Diagonal Branch The vessel is small in size and is angiographically normal.  Third Diagonal Branch The vessel is angiographically normal.  Left Circumflex . Vessel is angiographically normal.  First Obtuse Marginal Branch The vessel is angiographically normal.  Second Obtuse Marginal Branch The vessel is angiographically normal.  Third Obtuse Marginal Branch The vessel is angiographically normal.  Right Coronary Artery . Vessel is angiographically normal.  Right Posterior Descending Artery The vessel is angiographically normal.  Right Posterior Atrioventricular Artery The vessel is angiographically normal.  First Right Posterolateral Branch The vessel is angiographically normal.  Second Right Posterolateral Branch The vessel is angiographically normal.  Third Right Posterolateral Branch The vessel is angiographically normal.  Intervention  No interventions have been documented.   STRESS TESTS  EXERCISE TOLERANCE TEST (ETT) 04/18/2016  Interpretation Summary  Blood pressure demonstrated a hypertensive response to exercise.   ECHOCARDIOGRAM  ECHOCARDIOGRAM COMPLETE  01/09/2017  Narrative *[facility]* [address] [place], [place] [address] [phone]  ------------------------------------------------------------------- Transthoracic Echocardiography  Patient:    Stacey Cline, Stacey Cline MR #:       [id number] Study Date: 01/09/2017 Gender:     F Age:        21 Height:     168.9 cm Weight:     90.3 kg BSA:        2.09 m^2 Pt. Status: Room:  ATTENDING    Default, Provider [id number] TISA Sage, Elspeth BARTON Sage Elspeth PERFORMING   [place], [place] SONOGRAPHER  Arley Pac, RVT, RDCS, RDMS  cc:  ------------------------------------------------------------------- LV EF: 55% -   60%  ------------------------------------------------------------------- History:   PMH:  h/o PVC&'s and LBBB.  Dyspnea.  Mitral valve disease.  Risk factors:  Hypertension. Dyslipidemia.  ------------------------------------------------------------------- Study Conclusions  - Left ventricle: The cavity size was normal. Systolic function was normal. The estimated ejection fraction was in the range of 55% to 60%. Wall motion was normal; there were no regional wall motion abnormalities. Left ventricular diastolic function parameters were normal. - Mitral valve: There was mild regurgitation. - Left atrium: The atrium was normal in size. - Right ventricle: Systolic function was normal. - Pulmonary arteries: Systolic pressure was within the normal range.  ------------------------------------------------------------------- Labs, prior tests, procedures, and surgery: ECG.     Abnormal.  ------------------------------------------------------------------- Study data:  The previous study was not available, so comparison was made to the report of August 2017.  Study status:  Routine. Procedure:  Transthoracic echocardiography. Image quality was fair. The study was technically  difficult, as a result of body habitus. Study  completion:  There were no complications. Transthoracic echocardiography.  M-mode, complete 2D, spectral Doppler, and color Doppler.  Birthdate:  Patient birthdate: 12-11-1955.  Age:  Patient is 68 yr old.  Sex:  Gender: female. BMI: 31.6 kg/m^2.  Blood pressure:     164/84  Patient status: Outpatient.  Study date:  Study date: 01/09/2017. Study time: 09:49 AM.  -------------------------------------------------------------------  ------------------------------------------------------------------- Left ventricle:  The cavity size was normal. Systolic function was normal. The estimated ejection fraction was in the range of 55% to 60%. Wall motion was normal; there were no regional wall motion abnormalities. The transmitral flow pattern was normal. The deceleration time of the early transmitral flow velocity was normal. The pulmonary vein flow pattern was normal. The tissue Doppler parameters were normal. Left ventricular diastolic function parameters were normal.  ------------------------------------------------------------------- Aortic valve:   Trileaflet; normal thickness leaflets. Mobility was not restricted.  Doppler:  Transvalvular velocity was within the normal range. There was no stenosis. There was no regurgitation. VTI ratio of LVOT to aortic valve: 0.61. Valve area (VTI): 1.72 cm^2. Indexed valve area (VTI): 0.82 cm^2/m^2. Mean velocity ratio of LVOT to aortic valve: 0.59. Valve area (Vmean): 1.69 cm^2. Indexed valve area (Vmean): 0.81 cm^2/m^2.    Mean gradient (S): 3 mm Hg.  ------------------------------------------------------------------- Aorta:  Aortic root: The aortic root was normal in size.  ------------------------------------------------------------------- Mitral valve:   Structurally normal valve.   Mobility was not restricted.  Doppler:  Transvalvular velocity was within the normal range. There was no evidence for stenosis. There was mild regurgitation.     Valve area by pressure half-time: 4.4 cm^2. Indexed valve area by pressure half-time: 2.11 cm^2/m^2.    Peak gradient (D): 3 mm Hg.  ------------------------------------------------------------------- Left atrium:  The atrium was normal in size.  ------------------------------------------------------------------- Right ventricle:  The cavity size was normal. Wall thickness was normal. Systolic function was normal.  ------------------------------------------------------------------- Pulmonic valve:    Structurally normal valve.   Cusp separation was normal.  Doppler:  Transvalvular velocity was within the normal range. There was no evidence for stenosis. There was no regurgitation.  ------------------------------------------------------------------- Tricuspid valve:   Structurally normal valve.    Doppler: Transvalvular velocity was within the normal range. There was trivial regurgitation.  ------------------------------------------------------------------- Pulmonary artery:   The main pulmonary artery was normal-sized. Systolic pressure was within the normal range.  ------------------------------------------------------------------- Right atrium:  The atrium was normal in size.  ------------------------------------------------------------------- Pericardium:  There was no pericardial effusion.  ------------------------------------------------------------------- Systemic veins: Inferior vena cava: The vessel was normal in size. The respirophasic diameter changes were in the normal range (>= 50%), consistent with normal central venous pressure.  ------------------------------------------------------------------- Measurements  Left ventricle                           Value          Reference LV ID, ED, PLAX chordal                  48    mm       43 - 52 LV ID, ES, PLAX chordal                  35    mm       23 - 38 LV fx shortening, PLAX chordal   (L)     27    %         >=  29 LV PW thickness, ED                      11    mm       ---------- IVS/LV PW ratio, ED                      1.09           <=1.3 Stroke volume, 2D                        45    ml       ---------- Stroke volume/bsa, 2D                    22    ml/m^2   ---------- LV e&', lateral                           10    cm/s     ---------- LV E/e&', lateral                         8.67           ---------- LV e&', medial                            7.62  cm/s     ---------- LV E/e&', medial                          11.38          ---------- LV e&', average                           8.81  cm/s     ---------- LV E/e&', average                         9.84           ----------  Ventricular septum                       Value          Reference IVS thickness, ED                        12    mm       ----------  LVOT                                     Value          Reference LVOT ID, S                               19    mm       ---------- LVOT area                                2.84  cm^2     ---------- LVOT ID  19    mm       ---------- LVOT mean velocity, S                    49.7  cm/s     ---------- LVOT VTI, S                              15.7  cm       ---------- Stroke volume (SV), LVOT DP              44.5  ml       ---------- Stroke index (SV/bsa), LVOT DP           21.3  ml/m^2   ----------  Aortic valve                             Value          Reference Aortic valve mean velocity, S            83.6  cm/s     ---------- Aortic valve VTI, S                      25.9  cm       ---------- Aortic mean gradient, S                  3     mm Hg    ---------- VTI ratio, LVOT/AV                       0.61           ---------- Aortic valve area, VTI                   1.72  cm^2     ---------- Aortic valve area/bsa, VTI               0.82  cm^2/m^2 ---------- Velocity ratio, mean, LVOT/AV            0.59           ---------- Aortic valve area, mean velocity          1.69  cm^2     ---------- Aortic valve area/bsa, mean              0.81  cm^2/m^2 ---------- velocity  Aorta                                    Value          Reference Aortic root ID, ED                       28    mm       ----------  Left atrium                              Value          Reference LA ID, A-P, ES                           33    mm       ---------- LA ID/bsa,  A-P                           1.58  cm/m^2   <=2.2 LA volume, S                             43.9  ml       ---------- LA volume/bsa, S                         21    ml/m^2   ---------- LA volume, ES, 1-p A4C                   38.6  ml       ---------- LA volume/bsa, ES, 1-p A4C               18.5  ml/m^2   ---------- LA volume, ES, 1-p A2C                   49.6  ml       ---------- LA volume/bsa, ES, 1-p A2C               23.8  ml/m^2   ----------  Mitral valve                             Value          Reference Mitral E-wave peak velocity              86.7  cm/s     ---------- Mitral A-wave peak velocity              65.5  cm/s     ---------- Mitral deceleration time                 169   ms       150 - 230 Mitral pressure half-time                50    ms       ---------- Mitral peak gradient, D                  3     mm Hg    ---------- Mitral E/A ratio, peak                   1.3            ---------- Mitral valve area, PHT, DP               4.4   cm^2     ---------- Mitral valve area/bsa, PHT, DP           2.11  cm^2/m^2 ----------  Right atrium                             Value          Reference RA ID, S-I, ES, A4C                      41.8  mm       34 - 49 RA area, ES, A4C  11.1  cm^2     8.3 - 19.5 RA volume, ES, A/L                       24.8  ml       ---------- RA volume/bsa, ES, A/L                   11.9  ml/m^2   ----------  Right ventricle                          Value          Reference TAPSE                                    20.6  mm       ---------- RV s&',  lateral, S                        11    cm/s     ----------  Pulmonic valve                           Value          Reference Pulmonic valve peak velocity, S          67.9  cm/s     ----------  Legend: (L)  and  (H)  mark values outside specified reference range.  ------------------------------------------------------------------- Prepared and Electronically Authenticated by  Velinda Lunger, MD, FACC 2018-05-25T10:42:54    MONITORS  LONG TERM MONITOR (3-14 DAYS) 03/15/2022  Narrative Patch Wear Time:  2 days and 23 hours (2023-07-17T21:37:47-0400 to 2023-07-20T20:43:42-0400)  Patient had a min HR of 44 bpm, max HR of 118 bpm, and avg HR of 64 bpm. Predominant underlying rhythm was Sinus Rhythm. Slight P wave morphology changes were noted. Isolated SVEs were rare (<1.0%), and no SVE Couplets or SVE Triplets were present. Isolated VEs were occasional (1.2%, 3163), VE Couplets were rare (<1.0%, 12), and no VE Triplets were present.   CT SCANS  CT CORONARY MORPH W/CTA COR W/SCORE 05/05/2023  Addendum 05/18/2023  9:04 PM ADDENDUM REPORT: 05/18/2023 21:02  EXAM: OVER-READ INTERPRETATION  CT CHEST  The following report is an over-read performed by radiologist Dr. Oneil Devonshire of [facility] on 05/18/2023. This over-read does not include interpretation of cardiac or coronary anatomy or pathology. The coronary calcium  score/coronary CTA interpretation by the cardiologist is attached.  COMPARISON:  02/09/2016  FINDINGS: Cardiovascular: There are no significant extracardiac vascular findings.  Mediastinum/Nodes: There are no enlarged lymph nodes within the visualized mediastinum.  Lungs/Pleura: There is no pleural effusion. The visualized lungs appear clear.  Upper abdomen: No significant findings in the visualized upper abdomen.  Musculoskeletal/Chest wall: No chest wall mass or suspicious osseous findings within the visualized chest.  IMPRESSION: No significant  extracardiac findings within the visualized chest.   Electronically Signed By: Oneil Devonshire M.D. On: 05/18/2023 21:02  Narrative CLINICAL DATA:  Mildly reduced LV function, PVC's.  Chest discomfort  EXAM: Cardiac/Coronary  CTA  TECHNIQUE: The patient was scanned on a Siemens Somatom scanner.  : A retrospective scan was triggered in the ascending thoracic aorta. Axial non-contrast 3 mm slices were carried out through the heart. The data set was analyzed on a dedicated work station and scored using  the Agatson method. Gantry rotation speed was 66 msecs and collimation was .6 mm. 100mg  of metoprolol  and 0.8 mg of sl NTG was given. The 3D data set was reconstructed in 5% intervals of the 60-95 % of the R-R cycle. Diastolic phases were analyzed on a dedicated work station using MPR, MIP and VRT modes. The patient received 80 cc of contrast.  FINDINGS: Aorta:  Normal size.  No calcifications.  No dissection.  Aortic Valve:  Trileaflet.  No calcifications.  Coronary Arteries:  Normal coronary origin.  Right dominance.  RCA is a dominant artery. There is no plaque.  Left main gives rise to LAD and LCX arteries. LM has no disease.  LAD has calcified plaque proximally causing minimal stenosis (<25%).  LCX is a non-dominant artery.  There is no plaque.  Other findings:  Normal pulmonary vein drainage into the left atrium.  Normal left atrial appendage without a thrombus.  Normal size of the pulmonary artery.  IMPRESSION: 1. Coronary calcium  score of 34.3. This was 68th percentile for age and sex matched control.  2. Normal coronary origin with right dominance.  3. Minimal proximal LAD stenosis (<25%).  4. CAD-RADS 1. Minimal non-obstructive CAD (0-24%). Consider preventive therapy and risk factor modification.  Electronically Signed: By: Redell Cave M.D. On: 05/05/2023 10:24   CARDIAC MRI  MR CARDIAC MORPHOLOGY W WO CONTRAST  05/02/2016  Narrative CLINICAL DATA:  PVC;s Cardiomyopathy  EXAM: CARDIAC MRI  TECHNIQUE: The patient was scanned on a 1.5 Tesla GE magnet. A dedicated cardiac coil was used. Functional imaging was done using Fiesta sequences. 2,3, and 4 chamber views were done to assess for RWMA's. Modified Simpson's rule using a short axis stack was used to calculate an ejection fraction on a dedicated work [employer]. The patient received 28 cc of Multihance . After 10 minutes inversion recovery sequences were used to assess for infiltration and scar tissue.  CONTRAST:  28 cc Multihance   FINDINGS: There was moderate LAE. The RA/RV were normal in size and function. Specific sequences for ARVD not done but morphologically and functionally RV was normal. There was no pericardial effusion  There was no ASD/VSD. Aortic root was normal. There was moderate appearing MR AV appeared normal. The quantitative EF was 53% with no discrete RWMA (EDV 137 ESV 65 cc SV 72 cc) Delayed enhancement images with gadolinium showed no hyperenhancement scar or infiltration  IMPRESSION: 1) Normal LV size and low normal function EF 53% no discrete RWMAls  2) No delayed enhancement post gadolinium  3) Normal RV size and function specific sequences for ARVD not done  4) Moderate LAE  5) Moderate appearing MR  Maude Emmer   Electronically Signed By: Maude Emmer M.D. On: 05/08/2016 11:38   ______________________________________________________________________________________________      Risk Assessment/Calculations     Physical Exam VS:  BP (!) 144/76 (BP Location: Left Arm, Patient Position: Sitting, Cuff Size: Normal)   Pulse 64   Ht 5' 6 (1.676 m)   Wt 193 lb 9.6 oz (87.8 kg)   SpO2 95%   BMI 31.25 kg/m        Wt Readings from Last 3 Encounters:  06/08/24 193 lb 9.6 oz (87.8 kg)  02/25/24 190 lb (86.2 kg)  04/24/23 184 lb 12.8 oz (83.8 kg)    GEN: Well nourished,  well developed in no acute distress NECK: No JVD; No carotid bruits CARDIAC: RRR, no murmurs, rubs, gallops RESPIRATORY:  Clear to auscultation without rales, wheezing  or rhonchi  ABDOMEN: Soft, non-tender, non-distended EXTREMITIES:  No edema; No deformity   ASSESSMENT AND PLAN Hypertension with a blood pressure today 144/76.  Patient's blood pressure is slightly elevated today from her normal.  Will continue to monitor.  Typically she runs 120 maybe to 130.  Will continue to monitor if blood pressure continues to remain elevated we will recommend starting antihypertensive medication.  Further chart review reveals blood pressure has been better controlled at previous visits.  Minimal nonobstructive coronary artery disease noted on recent coronary CTA with minimal proximal LAD stenosis less than 25%.  Coronary calcium  score 34.3.  Recommendation is preventative therapy and risk factor modification.  Patient is continued on statin therapy.  Denies any chest pain.  No ischemic changes noted on EKG.  No further ischemic evaluation needed at this time.  PVCs with an unusual morphology send per Dr. Celine previous records well.  She denies recurrent palpitations.  She has been continued on flecainide  100 mg twice daily.  Chart review reveals that she did not tolerate previously down titration.  She recently underwent coronary CTA for concerns of chest discomfort which revealed a coronary calcium  score in the 30s with only 0 to 24% with minimal nonobstructive CAD recommendation was to consider preventative therapy and risk factor modification.  With her calcium  score is low as it is not a very recommended for aspirin  daily.  She is continued on rosuvastatin 10 mg daily.  Hyperlipidemia with LDL of 96 her goal should be 70 left with minimal nonobstructive CAD noted on coronary CTA and 0 to 24% less than 25% stenosis in the proximal LAD.  She is continued on rosuvastatin 10 mg daily.  She has upcoming labs  with her PCP with updated LDL at that time.  Consider uptitrating rosuvastatin or addition of ezetimibe then.  Obesity with a BMI of 31.25.  She has been encouraged to maintain her lifestyle modifications with increasing her activity and her dietary modifications as well.       Dispo: Patient to return to clinic to see MD/APP in 11 to 12 months or sooner if needed for further evaluation.  Signed, Shubham Thackston, NP

## 2024-06-08 NOTE — Patient Instructions (Signed)
 Medication Instructions:  Your physician recommends that you continue on your current medications as directed. Please refer to the Current Medication list given to you today.   *If you need a refill on your cardiac medications before your next appointment, please call your pharmacy*  Lab Work: No labs ordered today  If you have labs (blood work) drawn today and your tests are completely normal, you will receive your results only by: MyChart Message (if you have MyChart) OR A paper copy in the mail If you have any lab test that is abnormal or we need to change your treatment, we will call you to review the results.  Testing/Procedures: No test ordered today   Follow-Up: At [facility], you and your health needs are our priority.  As part of our continuing mission to provide you with exceptional heart care, our providers are all part of one team.  This team includes your primary Cardiologist (physician) and Advanced Practice Providers or APPs (Physician Assistants and Nurse Practitioners) who all work together to provide you with the care you need, when you need it.  Your next appointment:   12 month(s)  Provider:   You may see one of the following Advanced Practice Providers on your designated Care Team:   Lonni Meager, NP Lesley Maffucci, PA-C Bernardino Bring, PA-C Cadence Freeport, PA-C Tylene Lunch, NP Barnie Hila, NP

## 2024-06-10 ENCOUNTER — Ambulatory Visit: Admitting: Cardiology

## 2024-06-22 ENCOUNTER — Other Ambulatory Visit: Payer: Self-pay | Admitting: Cardiology
